# Patient Record
Sex: Female | Born: 1975 | Race: White | Hispanic: No | Marital: Married | Smoking: Former smoker
Health system: Southern US, Community
[De-identification: ages and names within clinical notes are randomized; demographics above are authoritative.]

## PROBLEM LIST (undated history)

## (undated) DIAGNOSIS — Z9889 Other specified postprocedural states: Secondary | ICD-10-CM

## (undated) DIAGNOSIS — E1169 Type 2 diabetes mellitus with other specified complication: Secondary | ICD-10-CM

## (undated) DIAGNOSIS — E669 Obesity, unspecified: Secondary | ICD-10-CM

## (undated) DIAGNOSIS — F319 Bipolar disorder, unspecified: Secondary | ICD-10-CM

## (undated) DIAGNOSIS — R112 Nausea with vomiting, unspecified: Secondary | ICD-10-CM

## (undated) DIAGNOSIS — T4145XA Adverse effect of unspecified anesthetic, initial encounter: Secondary | ICD-10-CM

## (undated) DIAGNOSIS — L309 Dermatitis, unspecified: Secondary | ICD-10-CM

## (undated) DIAGNOSIS — J302 Other seasonal allergic rhinitis: Secondary | ICD-10-CM

## (undated) DIAGNOSIS — T8859XA Other complications of anesthesia, initial encounter: Secondary | ICD-10-CM

## (undated) DIAGNOSIS — Z8042 Family history of malignant neoplasm of prostate: Secondary | ICD-10-CM

## (undated) HISTORY — DX: Dermatitis, unspecified: L30.9

## (undated) HISTORY — DX: Family history of malignant neoplasm of prostate: Z80.42

---
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
## 2005-08-13 ENCOUNTER — Other Ambulatory Visit: Admission: RE | Admit: 2005-08-13 | Discharge: 2005-08-13 | Payer: Self-pay | Admitting: Family Medicine

## 2007-12-16 HISTORY — PX: OOPHORECTOMY: SHX86

## 2007-12-16 HISTORY — PX: SALPINGECTOMY: SHX328

## 2008-03-23 ENCOUNTER — Other Ambulatory Visit: Admission: RE | Admit: 2008-03-23 | Discharge: 2008-03-23 | Payer: Self-pay | Admitting: Obstetrics and Gynecology

## 2008-04-11 ENCOUNTER — Emergency Department (HOSPITAL_COMMUNITY): Admission: EM | Admit: 2008-04-11 | Discharge: 2008-04-12 | Payer: Self-pay | Admitting: Emergency Medicine

## 2008-04-12 ENCOUNTER — Inpatient Hospital Stay (HOSPITAL_COMMUNITY): Admission: AD | Admit: 2008-04-12 | Discharge: 2008-04-15 | Payer: Self-pay | Admitting: *Deleted

## 2008-04-12 ENCOUNTER — Ambulatory Visit: Payer: Self-pay | Admitting: *Deleted

## 2008-04-26 ENCOUNTER — Ambulatory Visit: Payer: Self-pay | Admitting: Psychiatry

## 2008-05-04 ENCOUNTER — Ambulatory Visit: Payer: Self-pay | Admitting: Psychiatry

## 2008-05-11 ENCOUNTER — Ambulatory Visit: Payer: Self-pay | Admitting: Psychiatry

## 2008-05-15 ENCOUNTER — Ambulatory Visit: Payer: Self-pay | Admitting: Psychiatry

## 2008-06-05 ENCOUNTER — Ambulatory Visit: Payer: Self-pay | Admitting: Psychiatry

## 2008-06-27 ENCOUNTER — Ambulatory Visit: Payer: Self-pay | Admitting: Psychiatry

## 2008-07-11 ENCOUNTER — Ambulatory Visit: Payer: Self-pay | Admitting: Psychiatry

## 2008-07-26 ENCOUNTER — Ambulatory Visit: Payer: Self-pay | Admitting: Psychiatry

## 2008-08-08 ENCOUNTER — Ambulatory Visit: Payer: Self-pay | Admitting: Psychiatry

## 2008-09-27 ENCOUNTER — Ambulatory Visit: Payer: Self-pay | Admitting: Psychiatry

## 2008-11-01 ENCOUNTER — Ambulatory Visit: Payer: Self-pay | Admitting: Psychiatry

## 2009-09-20 ENCOUNTER — Other Ambulatory Visit: Admission: RE | Admit: 2009-09-20 | Discharge: 2009-09-20 | Payer: Self-pay | Admitting: Obstetrics and Gynecology

## 2010-10-16 ENCOUNTER — Other Ambulatory Visit: Admission: RE | Admit: 2010-10-16 | Discharge: 2010-10-16 | Payer: Self-pay | Admitting: Obstetrics and Gynecology

## 2011-04-29 NOTE — H&P (Signed)
[name], [name] NO.:  [id number]   MEDICAL RECORD NO.:  [id number]          PATIENT TYPE:  IPS   LOCATION:  0304                          FACILITY:  [facility]   PHYSICIAN:  Jasmine Pang, M.D. DATE OF BIRTH:  August 03, 1976   DATE OF ADMISSION:  04/12/2008  DATE OF DISCHARGE:                       PSYCHIATRIC ADMISSION ASSESSMENT   IDENTIFYING INFORMATION:  A 35 year old married white female.  This is a  voluntary admission.   HISTORY OF PRESENT ILLNESS:  First [facility] admission  for this pleasant 35 year old, whose family brought her to the emergency  room because of her mood fluctuation, some behavior out of character and  concerns about her mood.  Today, Rylei herself reports that she had  been having some mood fluctuations over the course of the past month.  About one month ago, she lapsed her lamotrigine, which she was taking  for bipolar disorder and then got back on it.  Then about two weeks ago,  also began having additional symptoms of mood fluctuation, sleep  decreased to 2-3 hours at a time maximum, broken sleep and feeling that  her thoughts were racing, that she could not keep track of things, then  reports that she got very overwhelmed and has been unable to think  straight.  She cites recent stressors of moving to a new house within  the past 30 days, having some marital discord at home along with some  work stressors and now they have an extra roommate in the house, a young  man that is staying with them.  She is also being evaluated for an  ovarian cyst and had laparoscopic surgery on April 11, 2008, and was  very worried about her physical condition until she learned the cyst was  benign.  She has currently not been taking her Lamictal for the past 4-5  days.  Other medication compliance is not clear.  She is denying any  hallucinations.  Denying any dangerous thoughts or intent to harm  herself or anyone else.  Admits to having  a lot of difficulty sleeping  and only slept two hours last night and having a lot of racing thoughts  and feeling disorganized.   PAST PSYCHIATRIC HISTORY:  First [facility] admission.  Francella is followed as an outpatient by Andee Poles, M.D., her  psychiatrist, and is currently taking Lamictal and Seroquel to manage  her mood.  Denies prior trials of Depakote or lithium.  Denies any prior  suicide attempt.  Denies other hospitalizations.   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  Home is stable.  Job is stable.  Her parents are in town and  they are supportive of her family history.  She denies any substance  abuse.   MEDICAL HISTORY:  Carola J. Gerri Spore, M.D., is her primary care  physician.  Current medical problems include status post laparoscopic  surgery for ovarian cyst.  CURRENT MEDICATIONS:  1. Phenergan 25 mg q.6-8h. p.r.n. for nausea.  2. Gabapentin 300 mg p.o. 2 capsules at bedtime.  3. Lamotrigine chewables 25 mg, was previously up to 150 mg daily, has      not taken in five days.  4. Alprazolam 1 mg, reports she takes between two and four of these      daily.  5. Hydrocodone/APAP 7.5 mg/750 mg one to two q.6h. p.r.n. for pain.  6. Seroquel XR 150 mg daily.  7. Oral allergy pills as needed for seasonal allergies.   DRUG ALLERGIES:  No known drug allergies.   PHYSICAL EXAMINATION:  VITAL SIGNS:  5 feet 4 inches tall, 271 pounds,  temperature 97.7, pulse 96, respirations 16, blood pressure 139/76.  GENERAL APPEARANCE:  This is a healthy-appearing white female in no  distress, whose full physical exam was done in the emergency room by Dr.  Rubin Payor.  Afebrile they.   DIAGNOSTIC STUDIES:  Urine pregnancy test negative.  Urine drug screen  positive for  benzodiazepines and opiates.  Routine urinalysis is normal  other than a trace of leukocyte esterase.  Urine wbc 0-2.  CBC:  A wbc  of 12.5, hemoglobin 12.7, hematocrit 37.9, platelets 430,000, MCV 77.  Alcohol level less than 5.  Chemistry sodium 137, potassium 4.3,  chloride 104, carbon dioxide 26, BUN 8, creatinine 0.7 and random  glucose 138.   MENTAL STATUS EXAM:  Fully alert female, pleasant, cooperative, bright  smile.  Affect is a bit elevated and in spite of her somewhat irritable  mood,  she is quite irritated with her husband, feeling that he forced  her to come here, that he has been rude to her, did not appreciate her  symptoms.  Speech is hyperverbal and rapid with tangential pattern.  Mood is elevated and irritable.  Thought process reflects flight of  ideas and tangentiality.  She is directable, needs to be prompted to  return to the object several times, accepts  prompting and direction  with only occasional a reflection of irritability.  Speech form is  normal, no pressure.  She is oriented to person, place and situation.  Insight is impaired.  Judgment is impaired.  She does have adequate  insight in order to list her various stressors including a recent move,  some job stress, feels that she has not been able to devote enough time  to get settled into the new house and is concerned about this newly  young gentleman that is living with them for reasons that are unclear.  Cognitively, she is completely intact and oriented x4.  Willing to work  with [place] on medication.  Recognizes that it is important to stay on her  medications and wants to resume the Lamictal.  Understands that we may  increase the Seroquel.   AXIS I:  Bipolar disorder, hypomanic.  AXIS II:  No diagnosis.  AXIS III:  Status post laparoscopic surgery.  AXIS IV:  Moderate, marital discord.  AXIS V:  Current 40 and past year not known.   PLAN:  Voluntarily admit the patient to stabilize her mood.  We  are  going to restart her Lamictal at 25 mg daily.  Seroquel 100 mg q.a.m.,  100 mg one-time daily p.r.n. for agitation and 300 mg p.o. nightly.   ESTIMATED LENGTH OF STAY:  Five days.      Margaret A. Lorin Picket, N.P.      Jasmine Pang, M.D.  Electronically Signed  MAS/MEDQ  D:  04/13/2008  T:  04/13/2008  Job:  [id number]

## 2011-05-02 NOTE — Discharge Summary (Signed)
[name], [name] NO.:  [id number]   MEDICAL RECORD NO.:  [id number]          PATIENT TYPE:  IPS   LOCATION:  0304                          FACILITY:  [facility]   PHYSICIAN:  Jasmine Pang, M.D. DATE OF BIRTH:  02-26-1976   DATE OF ADMISSION:  04/12/2008  DATE OF DISCHARGE:  04/15/2008                               DISCHARGE SUMMARY   IDENTIFICATION:  This is a 35 year old married white female who was  admitted on a voluntary basis.   HISTORY OF PRESENT ILLNESS:  This is the first [facility]  admission for this pleasant 35 year old, whose family brought her to the  emergency room because her mood fluctuation, some behavior out of  character and concerns about her mood.  Today, [name] herself reports  that she had been having some mood fluctuations over the course of the  past month.  About 1 month ago, she lapsed her lamotrigine, which she  was taking for bipolar disorder and then got back on it in about 2 weeks  ago.  She began having additional symptoms of mood fluctuation,  decreased sleep to 2-3 hours at a time maximum broken sleep, and feeling  that her thoughts were racing.  She began to feel she could not keep  track of things, then report she got very overwhelmed and had been  unable to think straight.  She cites recent stressors of moving to a new  house within the past 30 days.  She is having some marital discord at  home and some work stressors.  Now, they have an extra roommate in the  house, a young man that he is staying with them.  She is also being  evaluated for an ovarian cyst and had laparoscopic surgery on April 11, 2008.  She was very worried about her physical condition until she  learned the cyst was benign.  She has currently not been taking her  Lamictal for the past 4-5 days.  Other medication compliance is not  clear.  She is denying any hallucinations.  She is denying any dangerous  thoughts or intent to harm herself or  anyone else.  She admits to having  a lot of difficulty sleeping and only slept 2 hours last night having a  lot of racing thoughts and feeling disorganized.   PAST PSYCHIATRIC HISTORY:  This is the first [facility]  admission.  [name] is followed as an outpatient by Dr. Andee Poles,  her psychiatrist.  She is currently taking Lamictal and Seroquel to  manage her mood.  She denies prior trials of Depakote or lithium.  She  denies any prior suicide attempt.  She denies other hospitalizations.   MEDICAL HISTORY:  Current medical problems include, status post  laparoscopic surgery for ovarian cyst.   CURRENT MEDICATIONS:  1. Phenergan 25 mg q.6-8 h. for nausea.  2. Gabapentin 600 mg at bedtime.  3. Lamotrigine up to 150 mg daily, but has not taken in 5 days.  4. Alprazolam 1 mg between two and four of these daily.  5. Hydrocodone 7.5 mg/750 mg,  1-2 q.6 h. p.r.n. pain.  6. Seroquel XR 150 mg daily.  7. Oral allergy pills as needed for seasonal allergies.   DRUG ALLERGIES:  No known drug allergies.   PHYSICAL FINDINGS:  There were no acute physical or medical problems  noted.   DIAGNOSTIC STUDIES:  Urine pregnancy test was negative.  Urine drug  screen was positive for benzodiazepines and opiates.  She is prescribed  these.  Routine urinalysis is normal other than a trace of leukocyte  esterase.  The CBC was remarkable for hemoglobin of 12.7 and hematocrit  of 37.9.  Alcohol level was less than 5.  Chemistry panel was grossly  within normal limits and a random glucose was 138.   HOSPITAL COURSE:  Upon admission, the patient was continued on her  Seroquel XR 150 mg daily.  She was also started on Librium detox  protocol and Vicodin 7.5 mg/750 mg one q.6 h. p.r.n. pain, Seroquel 100  mg p.o. q.6 h. p.r.n. agitation, and lamotrigine 25 mg daily.  The  Seroquel on April 13, 2008, was changed to 100 mg b.i.d. p.r.n.  agitation and Seroquel 300 mg p.o. q.h.s.  The former  Seroquel was  discontinued.  In individual sessions with me, the patient was friendly  and cooperative.  She participated appropriately in groups and  activities in unit therapies.  She states she went into hypomania.  She was labile, hyperverbal, flight of ideas, and circumstantial.  There  was lot of stressors with her job and her mood.  Her sleep has been  poor.  She had not been taking her medicines appropriately.  As  hospitalization progressed, the patient's mental status improved.  Her  sleep was good.  Appetite was good.  The mood was less anxious, less  depressed, and less manic.  She had a family session with her husband,  who was very supportive and it was decided the [name] would live with  her parents for a period of time.  The parents are very supportive too.  On Apr 15, 2008, mental status had improved markedly from admission  status.  The patient was sleeping well.  Appetite was good.  Her mood  was euthymic.  Affect wide range.  There was no suicidal or homicidal  ideation.  No thoughts of self-injurious behavior.  No auditory or  visual hallucinations.  No paranoia or delusions.  Thoughts were logical  and goal-directed.  Thought content no predominant theme.  Cognitive was  grossly back to baseline.  It was felt the patient was safe for  discharge today.   DISCHARGE DIAGNOSES:  Axis I:  Bipolar disorder, hypomanic.  Axis II:  No diagnosis.  Axis III:  Status post laparoscopic surgery.  Axis IV:  Moderate to severe (marital discord, burden of psychiatric  illness, recent medical problems).  Axis V:  Global assessment of functioning was 50 upon discharge.  GAF  was 40 upon admission.  GAF highest past year was 60-65.   DISCHARGE PLANS:  There were no specific activity level or dietary  restrictions.   POSTHOSPITAL CARE PLANS:  The patient will see Dr. Nolen Mu on Apr 17, 2008, at 10 a.m.Marland Kitchen  She will be seen at [facility] for counseling.   DISCHARGE  MEDICATIONS:  1. Seroquel 300 mg at bedtime.  2. Lamictal 25 mg daily.  3. Hydrocodone as directed by her primary care doctor.  4. The Phenergan and allergy medications are to be resumed as      prescribed  by her primary care physician.      Jasmine Pang, M.D.  Electronically Signed     BHS/MEDQ  D:  05/18/2008  T:  05/18/2008  Job:  [id number]

## 2011-09-09 LAB — RAPID URINE DRUG SCREEN, HOSP PERFORMED
Cocaine: NOT DETECTED
Tetrahydrocannabinol: NOT DETECTED

## 2011-09-09 LAB — URINALYSIS, ROUTINE W REFLEX MICROSCOPIC
Ketones, ur: NEGATIVE
Nitrite: NEGATIVE
Specific Gravity, Urine: 1.007
pH: 6.5

## 2011-09-09 LAB — DIFFERENTIAL
Basophils Relative: 0
Eosinophils Absolute: 0
Eosinophils Relative: 0
Lymphs Abs: 0.9
Monocytes Relative: 2 — ABNORMAL LOW

## 2011-09-09 LAB — BASIC METABOLIC PANEL
BUN: 8
CO2: 26
Chloride: 104
Creatinine, Ser: 0.7
Potassium: 4.3

## 2011-09-09 LAB — URINE MICROSCOPIC-ADD ON

## 2011-09-09 LAB — CBC
HCT: 37.9
MCHC: 33.5
MCV: 77 — ABNORMAL LOW
Platelets: 430 — ABNORMAL HIGH
WBC: 12.5 — ABNORMAL HIGH

## 2011-09-09 LAB — PREGNANCY, URINE: Preg Test, Ur: NEGATIVE

## 2011-09-09 LAB — ETHANOL

## 2011-12-12 ENCOUNTER — Other Ambulatory Visit: Payer: Self-pay | Admitting: Obstetrics and Gynecology

## 2011-12-12 ENCOUNTER — Other Ambulatory Visit (HOSPITAL_COMMUNITY)
Admission: RE | Admit: 2011-12-12 | Discharge: 2011-12-12 | Disposition: A | Discharge status: Home / Self Care | Payer: 59 | Source: Ambulatory Visit | Attending: Obstetrics and Gynecology | Admitting: Obstetrics and Gynecology

## 2011-12-12 DIAGNOSIS — Z01419 Encounter for gynecological examination (general) (routine) without abnormal findings: Secondary | ICD-10-CM | POA: Insufficient documentation

## 2011-12-22 ENCOUNTER — Other Ambulatory Visit: Payer: Self-pay | Admitting: Obstetrics and Gynecology

## 2011-12-22 DIAGNOSIS — E049 Nontoxic goiter, unspecified: Secondary | ICD-10-CM

## 2011-12-26 ENCOUNTER — Other Ambulatory Visit: Payer: Self-pay

## 2012-01-14 ENCOUNTER — Ambulatory Visit
Admission: RE | Admit: 2012-01-14 | Discharge: 2012-01-14 | Disposition: A | Discharge status: Home / Self Care | Payer: 59 | Source: Ambulatory Visit | Attending: Obstetrics and Gynecology | Admitting: Obstetrics and Gynecology

## 2012-01-14 DIAGNOSIS — E049 Nontoxic goiter, unspecified: Secondary | ICD-10-CM

## 2012-02-12 ENCOUNTER — Other Ambulatory Visit: Payer: Self-pay | Admitting: Otolaryngology

## 2012-02-12 DIAGNOSIS — E042 Nontoxic multinodular goiter: Secondary | ICD-10-CM

## 2012-02-24 ENCOUNTER — Other Ambulatory Visit (HOSPITAL_COMMUNITY)
Admission: RE | Admit: 2012-02-24 | Discharge: 2012-02-24 | Disposition: A | Discharge status: Home / Self Care | Payer: 59 | Source: Ambulatory Visit | Attending: Interventional Radiology | Admitting: Interventional Radiology

## 2012-02-24 ENCOUNTER — Ambulatory Visit
Admission: RE | Admit: 2012-02-24 | Discharge: 2012-02-24 | Disposition: A | Discharge status: Home / Self Care | Payer: 59 | Source: Ambulatory Visit | Attending: Otolaryngology | Admitting: Otolaryngology

## 2012-02-24 DIAGNOSIS — E042 Nontoxic multinodular goiter: Secondary | ICD-10-CM

## 2012-02-24 DIAGNOSIS — E049 Nontoxic goiter, unspecified: Secondary | ICD-10-CM | POA: Insufficient documentation

## 2012-12-13 ENCOUNTER — Other Ambulatory Visit: Payer: Self-pay | Admitting: Obstetrics and Gynecology

## 2012-12-13 ENCOUNTER — Other Ambulatory Visit (HOSPITAL_COMMUNITY)
Admission: RE | Admit: 2012-12-13 | Discharge: 2012-12-13 | Disposition: A | Discharge status: Home / Self Care | Payer: 59 | Source: Ambulatory Visit | Attending: Obstetrics and Gynecology | Admitting: Obstetrics and Gynecology

## 2012-12-13 DIAGNOSIS — Z01419 Encounter for gynecological examination (general) (routine) without abnormal findings: Secondary | ICD-10-CM | POA: Insufficient documentation

## 2012-12-13 DIAGNOSIS — Z1151 Encounter for screening for human papillomavirus (HPV): Secondary | ICD-10-CM | POA: Insufficient documentation

## 2013-08-09 ENCOUNTER — Encounter (HOSPITAL_COMMUNITY): Payer: Self-pay

## 2013-08-09 ENCOUNTER — Emergency Department (HOSPITAL_COMMUNITY)
Admission: EM | Admit: 2013-08-09 | Discharge: 2013-08-09 | Disposition: A | Discharge status: Home / Self Care | Payer: Worker's Compensation | Attending: Emergency Medicine | Admitting: Emergency Medicine

## 2013-08-09 DIAGNOSIS — Y929 Unspecified place or not applicable: Secondary | ICD-10-CM | POA: Insufficient documentation

## 2013-08-09 DIAGNOSIS — Y990 Civilian activity done for income or pay: Secondary | ICD-10-CM | POA: Insufficient documentation

## 2013-08-09 DIAGNOSIS — Y9389 Activity, other specified: Secondary | ICD-10-CM | POA: Insufficient documentation

## 2013-08-09 DIAGNOSIS — IMO0002 Reserved for concepts with insufficient information to code with codable children: Secondary | ICD-10-CM | POA: Insufficient documentation

## 2013-08-09 DIAGNOSIS — E669 Obesity, unspecified: Secondary | ICD-10-CM | POA: Insufficient documentation

## 2013-08-09 DIAGNOSIS — Z79899 Other long term (current) drug therapy: Secondary | ICD-10-CM | POA: Insufficient documentation

## 2013-08-09 DIAGNOSIS — F319 Bipolar disorder, unspecified: Secondary | ICD-10-CM | POA: Insufficient documentation

## 2013-08-09 DIAGNOSIS — Z8709 Personal history of other diseases of the respiratory system: Secondary | ICD-10-CM | POA: Insufficient documentation

## 2013-08-09 DIAGNOSIS — S060X0A Concussion without loss of consciousness, initial encounter: Secondary | ICD-10-CM | POA: Insufficient documentation

## 2013-08-09 HISTORY — DX: Obesity, unspecified: E66.9

## 2013-08-09 HISTORY — DX: Other seasonal allergic rhinitis: J30.2

## 2013-08-09 HISTORY — DX: Bipolar disorder, unspecified: F31.9

## 2013-08-09 MED ORDER — IBUPROFEN 800 MG PO TABS
800.0000 mg | ORAL_TABLET | Freq: Once | ORAL | Status: AC
  Administered 2013-08-09: 800 mg via ORAL
  Filled 2013-08-09: qty 1

## 2013-08-09 MED ORDER — ONDANSETRON 4 MG PO TBDP
4.0000 mg | ORAL_TABLET | Freq: Once | ORAL | Status: AC
  Administered 2013-08-09: 4 mg via ORAL
  Filled 2013-08-09: qty 1

## 2013-08-09 NOTE — ED Notes (Signed)
Pt states at work, bent down and came up and hit her head on a cabinet; no LOC, c/o headache, nausea, sleepy; pt drinking water, no distress

## 2013-08-09 NOTE — ED Provider Notes (Signed)
CSN: [id number]     Arrival date & time 08/09/13  1724 History   First MD Initiated Contact with Patient 08/09/13 1838     Chief Complaint  Patient presents with  . Head Injury   (Consider location/radiation/quality/duration/timing/severity/associated sxs/prior Treatment) HPI This is a 37 year old female who presents from work following a closed head injury. The patient states that she was bending down when she came up and hit her for head on the cabinet. There was no loss of consciousness. This happened approximately 2 hours prior to arrival. She endorses headache, nausea, and sleepiness. She states that she applied ice directly to the wound immediately. She is concerned that she may have a concussion. She denies any vomiting, vision changes, focal weakness or numbness. Past Medical History  Diagnosis Date  . Obesity   . Pre-diabetes   . Bipolar 1 disorder   . Seasonal allergies    Past Surgical History  Procedure Laterality Date  . Oophorectomy     No family history on file. History  Substance Use Topics  . Smoking status: Never Smoker   . Smokeless tobacco: Not on file  . Alcohol Use: Yes     Comment: social   OB History   Grav Para Term Preterm Abortions TAB SAB Ect Mult Living                 Review of Systems  Constitutional: Negative for fever.  Eyes: Negative for visual disturbance.  Respiratory: Negative for cough, chest tightness and shortness of breath.   Cardiovascular: Negative for chest pain.  Gastrointestinal: Negative for nausea, vomiting and abdominal pain.  Genitourinary: Negative for dysuria.  Musculoskeletal: Negative for back pain.  Skin: Negative for wound.  Neurological: Positive for headaches. Negative for dizziness, syncope, weakness and numbness.  Psychiatric/Behavioral: Negative for confusion.  All other systems reviewed and are negative.    Allergies  Review of patient's allergies indicates no known allergies.  Home Medications    Current Outpatient Rx  Name  Route  Sig  Dispense  Refill  . ALPRAZolam (XANAX) 0.25 MG tablet   Oral   Take 0.25 mg by mouth 3 (three) times daily as needed for anxiety.         . benztropine (COGENTIN) 2 MG tablet   Oral   Take 2 mg by mouth at bedtime.         . cetirizine (ZYRTEC) 10 MG tablet   Oral   Take 10 mg by mouth every morning.         . lamoTRIgine (LAMICTAL) 200 MG tablet   Oral   Take 200 mg by mouth every morning.         . norethindrone-ethinyl estradiol (JUNEL FE,GILDESS FE,LOESTRIN FE) 1-20 MG-MCG tablet   Oral   Take 1 tablet by mouth daily.         . QUEtiapine (SEROQUEL) 200 MG tablet   Oral   Take 200 mg by mouth at bedtime.          BP 154/94  Pulse 92  Temp(Src) 98.6 F (37 C) (Oral)  Resp 20  SpO2 96%  LMP 08/02/2013 Physical Exam  Nursing note and vitals reviewed. Constitutional: She is oriented to person, place, and time. She appears well-developed and well-nourished.  HENT:  Head: Normocephalic.  Mild erythema noted to the right forehead along the hairline, no hematoma or laceration noted  Eyes: Pupils are equal, round, and reactive to light.  Neck: Normal range of motion.  Cardiovascular:  Normal rate, regular rhythm and normal heart sounds.   Pulmonary/Chest: Effort normal and breath sounds normal. No respiratory distress. She has no wheezes.  Abdominal: Soft. Bowel sounds are normal.  Neurological: She is alert and oriented to person, place, and time.  Cranial nerves II through XII intact, 5 out of 5 strength in all 4 extremities, gait normal, no dysmetria  Skin: Skin is warm and dry.  Psychiatric: She has a normal mood and affect.    ED Course  Procedures (including critical care time) Labs Review Labs Reviewed - No data to display Imaging Review No results found.   Medications  ibuprofen (ADVIL,MOTRIN) tablet 800 mg (800 mg Oral Given 08/09/13 1900)  ondansetron (ZOFRAN-ODT) disintegrating tablet 4 mg (4 mg  Oral Given 08/09/13 1900)   MDM   1. Mild concussion, without loss of consciousness, initial encounter    This is a 37 year old female who presents with a closed head injury. She is nontoxic-appearing on exam and her vital signs are notable for blood pressure 154/94. She endorses nausea and headache without emesis. Her neurologic exam is completely benign.  Patient is not on any anticoagulants. She has no obvious evidence of head trauma with the exception of mild erythema to the forehead. At this time the patient is very low risk for intracranial abnormality including head bleed. Given the patient's symptoms of headache and nausea, she may have a mild concussion. She will be given concussion precautions. She has a primary care appointment tomorrow and will followup regarding her symptoms. The patient stated understanding  After history, exam, and medical workup I feel the patient has been appropriately medically screened and is safe for discharge home. Pertinent diagnoses were discussed with the patient. Patient was given return precautions.    Shon Baton, MD 08/09/13 [phone]

## 2014-08-07 ENCOUNTER — Emergency Department (HOSPITAL_COMMUNITY)
Admission: EM | Admit: 2014-08-07 | Discharge: 2014-08-08 | Disposition: A | Discharge status: Other Acute Inpatient Hospital | Payer: 59 | Attending: Emergency Medicine | Admitting: Emergency Medicine

## 2014-08-07 DIAGNOSIS — R000 Tachycardia, unspecified: Secondary | ICD-10-CM | POA: Diagnosis not present

## 2014-08-07 DIAGNOSIS — E669 Obesity, unspecified: Secondary | ICD-10-CM | POA: Diagnosis not present

## 2014-08-07 DIAGNOSIS — F411 Generalized anxiety disorder: Secondary | ICD-10-CM | POA: Insufficient documentation

## 2014-08-07 DIAGNOSIS — F909 Attention-deficit hyperactivity disorder, unspecified type: Secondary | ICD-10-CM | POA: Insufficient documentation

## 2014-08-07 DIAGNOSIS — Z046 Encounter for general psychiatric examination, requested by authority: Secondary | ICD-10-CM | POA: Insufficient documentation

## 2014-08-07 DIAGNOSIS — Z3202 Encounter for pregnancy test, result negative: Secondary | ICD-10-CM | POA: Insufficient documentation

## 2014-08-07 DIAGNOSIS — F309 Manic episode, unspecified: Secondary | ICD-10-CM | POA: Diagnosis not present

## 2014-08-07 DIAGNOSIS — Z79899 Other long term (current) drug therapy: Secondary | ICD-10-CM | POA: Diagnosis not present

## 2014-08-07 DIAGNOSIS — F39 Unspecified mood [affective] disorder: Secondary | ICD-10-CM

## 2014-08-07 NOTE — ED Notes (Signed)
Bed: WTR6 Expected date:  Expected time:  Means of arrival:  Comments: In police custody

## 2014-08-08 ENCOUNTER — Inpatient Hospital Stay (HOSPITAL_COMMUNITY)
Admission: AD | Admit: 2014-08-08 | Discharge: 2014-08-14 | DRG: 885 | Disposition: A | Discharge status: Home / Self Care | Payer: 59 | Source: Intra-hospital | Attending: Emergency Medicine | Admitting: Emergency Medicine

## 2014-08-08 ENCOUNTER — Encounter (HOSPITAL_COMMUNITY): Payer: Self-pay | Admitting: Emergency Medicine

## 2014-08-08 ENCOUNTER — Encounter (HOSPITAL_COMMUNITY): Payer: Self-pay | Admitting: *Deleted

## 2014-08-08 DIAGNOSIS — G4700 Insomnia, unspecified: Secondary | ICD-10-CM | POA: Diagnosis present

## 2014-08-08 DIAGNOSIS — IMO0002 Reserved for concepts with insufficient information to code with codable children: Secondary | ICD-10-CM | POA: Diagnosis not present

## 2014-08-08 DIAGNOSIS — E119 Type 2 diabetes mellitus without complications: Secondary | ICD-10-CM | POA: Diagnosis present

## 2014-08-08 DIAGNOSIS — J302 Other seasonal allergic rhinitis: Secondary | ICD-10-CM | POA: Diagnosis present

## 2014-08-08 DIAGNOSIS — F319 Bipolar disorder, unspecified: Secondary | ICD-10-CM | POA: Insufficient documentation

## 2014-08-08 DIAGNOSIS — T169XXA Foreign body in ear, unspecified ear, initial encounter: Secondary | ICD-10-CM | POA: Diagnosis present

## 2014-08-08 DIAGNOSIS — E669 Obesity, unspecified: Secondary | ICD-10-CM | POA: Diagnosis present

## 2014-08-08 DIAGNOSIS — F3112 Bipolar disorder, current episode manic without psychotic features, moderate: Principal | ICD-10-CM | POA: Diagnosis present

## 2014-08-08 DIAGNOSIS — T162XXA Foreign body in left ear, initial encounter: Secondary | ICD-10-CM

## 2014-08-08 DIAGNOSIS — E1169 Type 2 diabetes mellitus with other specified complication: Secondary | ICD-10-CM | POA: Diagnosis present

## 2014-08-08 DIAGNOSIS — Z6841 Body Mass Index (BMI) 40.0 and over, adult: Secondary | ICD-10-CM

## 2014-08-08 DIAGNOSIS — H938X9 Other specified disorders of ear, unspecified ear: Secondary | ICD-10-CM | POA: Diagnosis present

## 2014-08-08 DIAGNOSIS — F39 Unspecified mood [affective] disorder: Secondary | ICD-10-CM | POA: Diagnosis present

## 2014-08-08 DIAGNOSIS — F312 Bipolar disorder, current episode manic severe with psychotic features: Secondary | ICD-10-CM

## 2014-08-08 DIAGNOSIS — S00452A Superficial foreign body of left ear, initial encounter: Secondary | ICD-10-CM

## 2014-08-08 DIAGNOSIS — F309 Manic episode, unspecified: Secondary | ICD-10-CM | POA: Diagnosis not present

## 2014-08-08 HISTORY — DX: Type 2 diabetes mellitus with other specified complication: E66.9

## 2014-08-08 HISTORY — DX: Type 2 diabetes mellitus with other specified complication: E11.69

## 2014-08-08 LAB — CBC
HCT: 38.7 % (ref 36.0–46.0)
HEMOGLOBIN: 12.9 g/dL (ref 12.0–15.0)
MCH: 26.5 pg (ref 26.0–34.0)
MCHC: 33.3 g/dL (ref 30.0–36.0)
MCV: 79.5 fL (ref 78.0–100.0)
Platelets: 386 10*3/uL (ref 150–400)
RBC: 4.87 MIL/uL (ref 3.87–5.11)
RDW: 12.9 % (ref 11.5–15.5)
WBC: 14.1 10*3/uL — AB (ref 4.0–10.5)

## 2014-08-08 LAB — RAPID URINE DRUG SCREEN, HOSP PERFORMED
AMPHETAMINES: NOT DETECTED
BARBITURATES: NOT DETECTED
BENZODIAZEPINES: POSITIVE — AB
Cocaine: NOT DETECTED
Opiates: NOT DETECTED
TETRAHYDROCANNABINOL: NOT DETECTED

## 2014-08-08 LAB — COMPREHENSIVE METABOLIC PANEL
ALK PHOS: 68 U/L (ref 39–117)
ALT: 14 U/L (ref 0–35)
AST: 23 U/L (ref 0–37)
Albumin: 4 g/dL (ref 3.5–5.2)
Anion gap: 18 — ABNORMAL HIGH (ref 5–15)
BILIRUBIN TOTAL: 0.4 mg/dL (ref 0.3–1.2)
BUN: 7 mg/dL (ref 6–23)
CALCIUM: 9.6 mg/dL (ref 8.4–10.5)
CHLORIDE: 96 meq/L (ref 96–112)
CO2: 20 meq/L (ref 19–32)
Creatinine, Ser: 0.79 mg/dL (ref 0.50–1.10)
GLUCOSE: 164 mg/dL — AB (ref 70–99)
POTASSIUM: 3.7 meq/L (ref 3.7–5.3)
SODIUM: 134 meq/L — AB (ref 137–147)
Total Protein: 8.2 g/dL (ref 6.0–8.3)

## 2014-08-08 LAB — ACETAMINOPHEN LEVEL: Acetaminophen (Tylenol), Serum: <15 ug/mL (ref 10–30)

## 2014-08-08 LAB — POC URINE PREG, ED: PREG TEST UR: NEGATIVE

## 2014-08-08 LAB — ETHANOL: Alcohol, Ethyl (B): <11 mg/dL (ref 0–11)

## 2014-08-08 LAB — SALICYLATE LEVEL: Salicylate Lvl: <2 mg/dL — ABNORMAL LOW (ref 2.8–20.0)

## 2014-08-08 MED ORDER — MAGNESIUM HYDROXIDE 400 MG/5ML PO SUSP
30.0000 mL | Freq: Every day | ORAL | Status: DC | PRN
  Administered 2014-08-11: 30 mL via ORAL

## 2014-08-08 MED ORDER — NORETHIN ACE-ETH ESTRAD-FE 1-20 MG-MCG PO TABS
1.0000 | ORAL_TABLET | Freq: Every day | ORAL | Status: DC
  Administered 2014-08-09 – 2014-08-14 (×5): 1 via ORAL

## 2014-08-08 MED ORDER — LORAZEPAM 1 MG PO TABS
1.0000 mg | ORAL_TABLET | Freq: Three times a day (TID) | ORAL | Status: DC | PRN
  Administered 2014-08-08: 1 mg via ORAL
  Filled 2014-08-08: qty 1

## 2014-08-08 MED ORDER — ZOLPIDEM TARTRATE 5 MG PO TABS: 5.0000 mg | ORAL_TABLET | Freq: Every evening | ORAL | Status: DC | PRN

## 2014-08-08 MED ORDER — ALPRAZOLAM 0.25 MG PO TABS: 0.2500 mg | ORAL_TABLET | Freq: Three times a day (TID) | ORAL | Status: DC | PRN

## 2014-08-08 MED ORDER — ONDANSETRON HCL 4 MG PO TABS: 4.0000 mg | ORAL_TABLET | Freq: Three times a day (TID) | ORAL | Status: DC | PRN

## 2014-08-08 MED ORDER — ZIPRASIDONE MESYLATE 20 MG IM SOLR
20.0000 mg | Freq: Once | INTRAMUSCULAR | Status: AC
  Administered 2014-08-08: 20 mg via INTRAMUSCULAR
  Filled 2014-08-08: qty 20

## 2014-08-08 MED ORDER — HALOPERIDOL 5 MG PO TABS
5.0000 mg | ORAL_TABLET | Freq: Four times a day (QID) | ORAL | Status: DC | PRN
  Administered 2014-08-09: 5 mg via ORAL
  Filled 2014-08-08: qty 1

## 2014-08-08 MED ORDER — LAMOTRIGINE 200 MG PO TABS
200.0000 mg | ORAL_TABLET | Freq: Every morning | ORAL | Status: DC
  Administered 2014-08-08: 200 mg via ORAL
  Filled 2014-08-08: qty 1

## 2014-08-08 MED ORDER — QUETIAPINE FUMARATE 200 MG PO TABS
200.0000 mg | ORAL_TABLET | Freq: Every day | ORAL | Status: DC
  Administered 2014-08-08: 200 mg via ORAL
  Filled 2014-08-08 (×3): qty 1

## 2014-08-08 MED ORDER — IBUPROFEN 200 MG PO TABS: 600.0000 mg | ORAL_TABLET | Freq: Three times a day (TID) | ORAL | Status: DC | PRN

## 2014-08-08 MED ORDER — LORATADINE 10 MG PO TABS
10.0000 mg | ORAL_TABLET | Freq: Every day | ORAL | Status: DC
  Administered 2014-08-08: 10 mg via ORAL
  Filled 2014-08-08: qty 1

## 2014-08-08 MED ORDER — TRAZODONE HCL 50 MG PO TABS
50.0000 mg | ORAL_TABLET | Freq: Every evening | ORAL | Status: DC | PRN
  Administered 2014-08-08 – 2014-08-13 (×9): 50 mg via ORAL
  Filled 2014-08-08 (×18): qty 1

## 2014-08-08 MED ORDER — LAMOTRIGINE 200 MG PO TABS
200.0000 mg | ORAL_TABLET | Freq: Every morning | ORAL | Status: DC
  Administered 2014-08-09: 200 mg via ORAL
  Filled 2014-08-08 (×2): qty 1

## 2014-08-08 MED ORDER — ACETAMINOPHEN 325 MG PO TABS
650.0000 mg | ORAL_TABLET | Freq: Four times a day (QID) | ORAL | Status: DC | PRN
  Administered 2014-08-09 – 2014-08-10 (×2): 650 mg via ORAL
  Filled 2014-08-08 (×2): qty 2

## 2014-08-08 MED ORDER — ALUM & MAG HYDROXIDE-SIMETH 200-200-20 MG/5ML PO SUSP
30.0000 mL | ORAL | Status: DC | PRN
  Administered 2014-08-11 – 2014-08-12 (×3): 30 mL via ORAL

## 2014-08-08 MED ORDER — NICOTINE 21 MG/24HR TD PT24: 21.0000 mg | MEDICATED_PATCH | Freq: Every day | TRANSDERMAL | Status: DC

## 2014-08-08 MED ORDER — DIPHENHYDRAMINE HCL 50 MG/ML IJ SOLN
50.0000 mg | Freq: Once | INTRAMUSCULAR | Status: AC
  Administered 2014-08-08: 50 mg via INTRAMUSCULAR
  Filled 2014-08-08: qty 1

## 2014-08-08 MED ORDER — ALPRAZOLAM 0.25 MG PO TABS
0.2500 mg | ORAL_TABLET | Freq: Three times a day (TID) | ORAL | Status: DC | PRN
  Administered 2014-08-09 (×2): 0.25 mg via ORAL
  Filled 2014-08-08 (×2): qty 1

## 2014-08-08 MED ORDER — LORAZEPAM 2 MG/ML IJ SOLN
2.0000 mg | Freq: Once | INTRAMUSCULAR | Status: AC
  Administered 2014-08-08: 2 mg via INTRAMUSCULAR
  Filled 2014-08-08: qty 1

## 2014-08-08 MED ORDER — QUETIAPINE FUMARATE 100 MG PO TABS: 200.0000 mg | ORAL_TABLET | Freq: Every day | ORAL | Status: DC

## 2014-08-08 MED ORDER — BENZTROPINE MESYLATE 2 MG PO TABS
2.0000 mg | ORAL_TABLET | Freq: Every day | ORAL | Status: DC
  Administered 2014-08-08: 2 mg via ORAL
  Filled 2014-08-08: qty 1
  Filled 2014-08-08: qty 2
  Filled 2014-08-08: qty 1

## 2014-08-08 MED ORDER — BENZTROPINE MESYLATE 1 MG PO TABS: 2.0000 mg | ORAL_TABLET | Freq: Every day | ORAL | Status: DC

## 2014-08-08 MED ORDER — MUPIROCIN CALCIUM 2 % EX CREA
TOPICAL_CREAM | Freq: Two times a day (BID) | CUTANEOUS | Status: DC
  Administered 2014-08-08 – 2014-08-11 (×7): via TOPICAL
  Administered 2014-08-11: 2 via TOPICAL
  Administered 2014-08-12 – 2014-08-14 (×5): via TOPICAL
  Filled 2014-08-08 (×3): qty 15

## 2014-08-08 MED ORDER — NORETHIN ACE-ETH ESTRAD-FE 1-20 MG-MCG PO TABS: 1.0000 | ORAL_TABLET | Freq: Every day | ORAL | Status: DC

## 2014-08-08 MED ORDER — LORATADINE 10 MG PO TABS
10.0000 mg | ORAL_TABLET | Freq: Every day | ORAL | Status: DC
  Administered 2014-08-09 – 2014-08-14 (×6): 10 mg via ORAL
  Filled 2014-08-08 (×8): qty 1

## 2014-08-08 MED ORDER — METFORMIN HCL 500 MG PO TABS
500.0000 mg | ORAL_TABLET | Freq: Two times a day (BID) | ORAL | Status: DC
  Administered 2014-08-09 – 2014-08-14 (×10): 500 mg via ORAL
  Filled 2014-08-08 (×15): qty 1

## 2014-08-08 NOTE — ED Notes (Signed)
IVC paperwork corrected and [employer] contacted for pick-up. Patient and family informed.

## 2014-08-08 NOTE — Tx Team (Signed)
Initial Interdisciplinary Treatment Plan   PATIENT STRESSORS: Health problems Marital or family conflict mania   PROBLEM LIST: Problem List/Patient Goals Date to be addressed Date deferred Reason deferred Estimated date of resolution  Altered mental status-confusion, mania, euphoria, tangential, disorganized                                                       DISCHARGE CRITERIA:  Ability to meet basic life and health needs Improved stabilization in mood, thinking, and/or behavior Motivation to continue treatment in a less acute level of care Need for constant or close observation no longer present Safe-care adequate arrangements made Verbal commitment to aftercare and medication compliance  PRELIMINARY DISCHARGE PLAN: Attend PHP/IOP Outpatient therapy Return to previous living arrangement  PATIENT/FAMIILY INVOLVEMENT: This treatment plan has been presented to and reviewed with the patient, [name], and/or family member.  The patient and family have been given the opportunity to ask questions and make suggestions.  Junius Finner [facility] 08/08/2014, 8:58 PM

## 2014-08-08 NOTE — ED Notes (Signed)
Patient came out of BR with pillow that had been taken apart.  Rationalized behavior and was redirected.  States "I did it because I didn't have a hair band." Report to provider and offer for prn medication for increased anxiety.

## 2014-08-08 NOTE — ED Notes (Signed)
Pt not cooperating with blood draw, pulled torniquet off arm.  Will try later.  [employer] in room

## 2014-08-08 NOTE — ED Notes (Signed)
Pt brought in by [employer] under IVC  Paperwork states pt was at [employer] acting erratically, walking around in her bra, throwing items, shouting "I am not a #basic bitch"  Hx bipolar, multiple meds  Husband indicates this is typical behavior for maic phase onset  Pt is danger to self  Pt is manic in behavior and words in triage  Handcuffs in place by [employer]

## 2014-08-08 NOTE — ED Notes (Signed)
Patient wrote on the wall with crayon that was provided by triage

## 2014-08-08 NOTE — Progress Notes (Signed)
  CARE MANAGEMENT ED NOTE 08/08/2014  Patient:  [name], [name]   Account Number:  [id number]  Date Initiated:  08/08/2014  Documentation initiated by:  Jackelyn Poling  Subjective/Objective Assessment:   38 yr old [facility] pt     Subjective/Objective Assessment Detail:   dx mood disorder pmh obesity bipolar 1 disorder, seasonal allergies  pcp Noelle Redmon at [place] [facility] [address] [place]  Psychologist Dr Tomasita Crumble  OB GYN Dr Thurnell Lose ob gyn     Action/Plan:   spoke with pt updated providers in epic   Action/Plan Detail:   Anticipated DC Date:  08/08/2014     Status Recommendation to Physician:   Result of Recommendation:    Other ED [place]  Other  PCP issues  Outpatient Services - Pt will follow up    Choice offered to / List presented to:            Status of service:  Completed, signed off  ED Comments:   ED Comments Detail:

## 2014-08-08 NOTE — Progress Notes (Signed)
Invol admit to the 400 hall after having a recent mental break.  Pt's thoughts are disorganized and tangential.  She was cooperative with the admission, but unable to sign paperwork or comprehend any patient teaching.  Pt took the folder that was given to her and a napkin and laid them on the table in the search room.  Then she poured the ice water that had been given to her by the TTS tech on the items and used her hand to smooth out the water to completely cover the items.  Then she shook the ice onto the table.  As if pleased with herself, she said, "there, that's perfect".  Her skin was checked.  Staff noted that the small tattoo to her R wrist was slightly red.  No other skin markings noted.  Pt was taken to the hall and oriented to her room.   After staff left the room, pt lay down in the floor and began screaming loudly, but would stop when staff entered the room.  Pt allowed her mother to come visit her, but she did not want her husband to come back to the unit.  Safety checks q15 minutes initiated.

## 2014-08-08 NOTE — ED Notes (Signed)
Report to TTS need for IVC first opinion.

## 2014-08-08 NOTE — ED Provider Notes (Signed)
CSN: [id number]     Arrival date & time 08/07/14  2356 History   First MD Initiated Contact with Patient 08/08/14 0204     Chief Complaint  Patient presents with  . Medical Clearance   HPI  History provided by the patient and IVC papers. Patient is a 38 year old female with history of bipolar disorder and previous manic episodes presenting with concerns of safety and harm to herself. Patient was found standing outside wearing only her bra yelling and acting erratically she was wandering in the parking lots. Husband reported that this was sometimes typical behavior of a manic phase. Patient states she was taking her medications regularly there is some question to whether this was true. He does report having some increased stress but does not give any specifics. She denies any SI or HI but does state that she needs a safe place to go. No other complaints.    Past Medical History  Diagnosis Date  . Obesity   . Pre-diabetes   . Bipolar 1 disorder   . Seasonal allergies    Past Surgical History  Procedure Laterality Date  . Oophorectomy     History reviewed. No pertinent family history. History  Substance Use Topics  . Smoking status: Never Smoker   . Smokeless tobacco: Not on file  . Alcohol Use: Yes     Comment: social   OB History   Grav Para Term Preterm Abortions TAB SAB Ect Mult Living                 Review of Systems  Unable to perform ROS: Psychiatric disorder      Allergies  Review of patient's allergies indicates no known allergies.  Home Medications   Prior to Admission medications   Medication Sig Start Date End Date Taking? Authorizing Provider  ALPRAZolam (XANAX) 0.25 MG tablet Take 0.25 mg by mouth 3 (three) times daily as needed for anxiety.   Yes Historical Provider, MD  benztropine (COGENTIN) 2 MG tablet Take 2 mg by mouth at bedtime.   Yes Historical Provider, MD  cetirizine (ZYRTEC) 10 MG tablet Take 10 mg by mouth every morning.   Yes Historical  Provider, MD  lamoTRIgine (LAMICTAL) 200 MG tablet Take 200 mg by mouth every morning.   Yes Historical Provider, MD  norethindrone-ethinyl estradiol (JUNEL FE,GILDESS FE,LOESTRIN FE) 1-20 MG-MCG tablet Take 1 tablet by mouth daily.   Yes Historical Provider, MD  QUEtiapine (SEROQUEL) 200 MG tablet Take 200 mg by mouth at bedtime.   Yes Historical Provider, MD   BP 137/77  Pulse 132  Temp(Src) 98.2 F (36.8 C) (Oral)  Resp 20  SpO2 97% Physical Exam  Nursing note and vitals reviewed. Constitutional: She is oriented to person, place, and time. She appears well-developed and well-nourished. No distress.  HENT:  Head: Normocephalic.  Eyes: Conjunctivae are normal.  Cardiovascular: Regular rhythm.  Tachycardia present.   Pulmonary/Chest: Effort normal and breath sounds normal. No respiratory distress.  Neurological: She is alert and oriented to person, place, and time.  Skin: Skin is warm and dry. No rash noted.  Psychiatric: Her mood appears anxious. Her speech is rapid and/or pressured. She is hyperactive. She expresses impulsivity and inappropriate judgment.    ED Course  Procedures   COORDINATION OF CARE:  Nursing notes reviewed. Vital signs reviewed. Initial pt interview and examination performed.   Filed Vitals:   08/07/14 2356 08/08/14 0043  BP: 140/83 137/77  Pulse: 141 132  Temp: 98.9 F (37.2  C) 98.2 F (36.8 C)  TempSrc: Oral Oral  Resp: 26 20  SpO2: 97% 97%    2:15 AM-patient seen and evaluated. She is acting very impulsively. She has had hyperactivity.  TTS consult placed. Psychiatric holding orders placed. Patient is medically cleared for further psychiatric evaluation.    Treatment plan initiated:Medications - No data to display  Results for orders placed during the hospital encounter of 08/07/14  ACETAMINOPHEN LEVEL      Result Value Ref Range   Acetaminophen (Tylenol), Serum <15.0  10 - 30 ug/mL  CBC      Result Value Ref Range   WBC 14.1 (*) 4.0  - 10.5 K/uL   RBC 4.87  3.87 - 5.11 MIL/uL   Hemoglobin 12.9  12.0 - 15.0 g/dL   HCT 38.7  36.0 - 46.0 %   MCV 79.5  78.0 - 100.0 fL   MCH 26.5  26.0 - 34.0 pg   MCHC 33.3  30.0 - 36.0 g/dL   RDW 12.9  11.5 - 15.5 %   Platelets 386  150 - 400 K/uL  COMPREHENSIVE METABOLIC PANEL      Result Value Ref Range   Sodium 134 (*) 137 - 147 mEq/L   Potassium 3.7  3.7 - 5.3 mEq/L   Chloride 96  96 - 112 mEq/L   CO2 20  19 - 32 mEq/L   Glucose, Bld 164 (*) 70 - 99 mg/dL   BUN 7  6 - 23 mg/dL   Creatinine, Ser 0.79  0.50 - 1.10 mg/dL   Calcium 9.6  8.4 - 10.5 mg/dL   Total Protein 8.2  6.0 - 8.3 g/dL   Albumin 4.0  3.5 - 5.2 g/dL   AST 23  0 - 37 U/L   ALT 14  0 - 35 U/L   Alkaline Phosphatase 68  39 - 117 U/L   Total Bilirubin 0.4  0.3 - 1.2 mg/dL   GFR calc non Af Amer >90  >90 mL/min   GFR calc Af Amer >90  >90 mL/min   Anion gap 18 (*) 5 - 15  ETHANOL      Result Value Ref Range   Alcohol, Ethyl (B) <11  0 - 11 mg/dL  SALICYLATE LEVEL      Result Value Ref Range   Salicylate Lvl <2.5 (*) 2.8 - 20.0 mg/dL  URINE RAPID DRUG SCREEN (HOSP PERFORMED)      Result Value Ref Range   Opiates NONE DETECTED  NONE DETECTED   Cocaine NONE DETECTED  NONE DETECTED   Benzodiazepines POSITIVE (*) NONE DETECTED   Amphetamines NONE DETECTED  NONE DETECTED   Tetrahydrocannabinol NONE DETECTED  NONE DETECTED   Barbiturates NONE DETECTED  NONE DETECTED  POC URINE PREG, ED      Result Value Ref Range   Preg Test, Ur NEGATIVE  NEGATIVE        MDM   Final diagnoses:  Mania        Martie Lee, PA-C 08/08/14 0600

## 2014-08-08 NOTE — ED Notes (Signed)
Patient is resting

## 2014-08-08 NOTE — Consult Note (Signed)
[facility] Face-to-Face Psychiatry Consult   Reason for Consult:  Manic behavior  Referring Physician:  EDP  [name] is an 38 y.o. female. Total Time spent with patient: 45 minutes  Assessment: AXIS I:  Mood Disorder NOS AXIS II:  Deferred AXIS III:   Past Medical History  Diagnosis Date  . Obesity   . Pre-diabetes   . Bipolar 1 disorder   . Seasonal allergies    AXIS IV:  other psychosocial or environmental problems AXIS V:  21-30 behavior considerably influenced by delusions or hallucinations OR serious impairment in judgment, communication OR inability to function in almost all areas  Plan:  Recommend psychiatric Inpatient admission when medically cleared.  Subjective:   [name] is a 38 y.o. female patient admitted with Mood Disorder, NOS.  HPI:  Patient states I thought I was on a journey at that moment; but now I know I wasn't.  I felt like all of the results and philosophies  , the value. You know like there was a calm, you are happy and then just cram down your throat."  Patient is jumping from subject to subject, disorganized, and irreverent. When asked about the pillow case that she took to the bathroom and took apart patient states "I had a green head band. I was just walking in my own glory; well I know what I was doing." Patient denies suicidal/homicidal ideation, psychosis, and paranoia.     HPI Elements:   Location:  odd behavior. Quality:  manic. Severity:  manic. Timing:  1 day. Review of Systems  HENT: Negative.   Musculoskeletal: Negative.   Psychiatric/Behavioral: Negative for depression, suicidal ideas and substance abuse. The patient is nervous/anxious.    History reviewed. No pertinent family history.  Past Psychiatric History: Past Medical History  Diagnosis Date  . Obesity   . Pre-diabetes   . Bipolar 1 disorder   . Seasonal allergies     reports that she has never smoked. She does not have any smokeless tobacco history on file. She reports  that she drinks alcohol. She reports that she does not use illicit drugs. History reviewed. No pertinent family history.         Allergies:  No Known Allergies  ACT Assessment Complete:  Yes:    Educational Status    Risk to Self: Risk to self with the past 6 months Is patient at risk for suicide?: No Substance abuse history and/or treatment for substance abuse?: No  Risk to Others:    Abuse:    Prior Inpatient Therapy:    Prior Outpatient Therapy:    Additional Information:                    Objective: Blood pressure 104/73, pulse 104, temperature 97.8 F (36.6 C), temperature source Oral, resp. rate 20, SpO2 100.00%.There is no height or weight on file to calculate BMI. Results for orders placed during the hospital encounter of 08/07/14 (from the past 72 hour(s))  URINE RAPID DRUG SCREEN (HOSP PERFORMED)     Status: Abnormal   Collection Time    08/08/14  2:13 AM      Result Value Ref Range   Opiates NONE DETECTED  NONE DETECTED   Cocaine NONE DETECTED  NONE DETECTED   Benzodiazepines POSITIVE (*) NONE DETECTED   Amphetamines NONE DETECTED  NONE DETECTED   Tetrahydrocannabinol NONE DETECTED  NONE DETECTED   Barbiturates NONE DETECTED  NONE DETECTED   Comment:  DRUG SCREEN FOR MEDICAL PURPOSES     ONLY.  IF CONFIRMATION IS NEEDED     FOR ANY PURPOSE, NOTIFY LAB     WITHIN 5 DAYS.                LOWEST DETECTABLE LIMITS     FOR URINE DRUG SCREEN     Drug Class       Cutoff (ng/mL)     Amphetamine      1000     Barbiturate      200     Benzodiazepine   500     Tricyclics       370     Opiates          300     Cocaine          300     THC              50  POC URINE PREG, ED     Status: None   Collection Time    08/08/14  2:42 AM      Result Value Ref Range   Preg Test, Ur NEGATIVE  NEGATIVE   Comment:            THE SENSITIVITY OF THIS     METHODOLOGY IS >24 mIU/mL  ACETAMINOPHEN LEVEL     Status: None   Collection Time    08/08/14   3:25 AM      Result Value Ref Range   Acetaminophen (Tylenol), Serum <15.0  10 - 30 ug/mL   Comment:            THERAPEUTIC CONCENTRATIONS VARY     SIGNIFICANTLY. A RANGE OF 10-30     ug/mL MAY BE AN EFFECTIVE     CONCENTRATION FOR MANY PATIENTS.     HOWEVER, SOME ARE BEST TREATED     AT CONCENTRATIONS OUTSIDE THIS     RANGE.     ACETAMINOPHEN CONCENTRATIONS     >150 ug/mL AT 4 HOURS AFTER     INGESTION AND >50 ug/mL AT 12     HOURS AFTER INGESTION ARE     OFTEN ASSOCIATED WITH TOXIC     REACTIONS.  CBC     Status: Abnormal   Collection Time    08/08/14  3:25 AM      Result Value Ref Range   WBC 14.1 (*) 4.0 - 10.5 K/uL   RBC 4.87  3.87 - 5.11 MIL/uL   Hemoglobin 12.9  12.0 - 15.0 g/dL   HCT 38.7  36.0 - 46.0 %   MCV 79.5  78.0 - 100.0 fL   MCH 26.5  26.0 - 34.0 pg   MCHC 33.3  30.0 - 36.0 g/dL   RDW 12.9  11.5 - 15.5 %   Platelets 386  150 - 400 K/uL  COMPREHENSIVE METABOLIC PANEL     Status: Abnormal   Collection Time    08/08/14  3:25 AM      Result Value Ref Range   Sodium 134 (*) 137 - 147 mEq/L   Potassium 3.7  3.7 - 5.3 mEq/L   Chloride 96  96 - 112 mEq/L   CO2 20  19 - 32 mEq/L   Glucose, Bld 164 (*) 70 - 99 mg/dL   BUN 7  6 - 23 mg/dL   Creatinine, Ser 0.79  0.50 - 1.10 mg/dL   Calcium 9.6  8.4 - 10.5 mg/dL   Total Protein 8.2  6.0 -  8.3 g/dL   Albumin 4.0  3.5 - 5.2 g/dL   AST 23  0 - 37 U/L   ALT 14  0 - 35 U/L   Alkaline Phosphatase 68  39 - 117 U/L   Total Bilirubin 0.4  0.3 - 1.2 mg/dL   GFR calc non Af Amer >90  >90 mL/min   GFR calc Af Amer >90  >90 mL/min   Comment: (NOTE)     The eGFR has been calculated using the CKD EPI equation.     This calculation has not been validated in all clinical situations.     eGFR's persistently <90 mL/min signify possible Chronic Kidney     Disease.   Anion gap 18 (*) 5 - 15  ETHANOL     Status: None   Collection Time    08/08/14  3:25 AM      Result Value Ref Range   Alcohol, Ethyl (B) <11  0 - 11 mg/dL    Comment:            LOWEST DETECTABLE LIMIT FOR     SERUM ALCOHOL IS 11 mg/dL     FOR MEDICAL PURPOSES ONLY  SALICYLATE LEVEL     Status: Abnormal   Collection Time    08/08/14  3:25 AM      Result Value Ref Range   Salicylate Lvl <8.4 (*) 2.8 - 20.0 mg/dL   Labs are reviewed see values above; medications reviewed and no changes made.  Current Facility-Administered Medications  Medication Dose Route Frequency Provider Last Rate Last Dose  . ALPRAZolam (XANAX) tablet 0.25 mg  0.25 mg Oral TID PRN Shuvon Rankin, NP      . benztropine (COGENTIN) tablet 2 mg  2 mg Oral QHS Peter S Dammen, PA-C      . ibuprofen (ADVIL,MOTRIN) tablet 600 mg  600 mg Oral Q8H PRN Martie Lee, PA-C      . lamoTRIgine (LAMICTAL) tablet 200 mg  200 mg Oral q morning - 10a Ruthell Rummage Dammen, PA-C   200 mg at 08/08/14 6962  . loratadine (CLARITIN) tablet 10 mg  10 mg Oral Daily Martie Lee, PA-C   10 mg at 08/08/14 0955  . norethindrone-ethinyl estradiol (JUNEL FE,GILDESS FE,LOESTRIN FE) 1-20 MG-MCG per tablet 1 tablet  1 tablet Oral Daily Ruthell Rummage Dammen, PA-C      . ondansetron (ZOFRAN) tablet 4 mg  4 mg Oral Q8H PRN Ruthell Rummage Dammen, PA-C      . QUEtiapine (SEROQUEL) tablet 200 mg  200 mg Oral QHS Martie Lee, PA-C       Current Outpatient Prescriptions  Medication Sig Dispense Refill  . ALPRAZolam (XANAX) 0.25 MG tablet Take 0.25 mg by mouth 3 (three) times daily as needed for anxiety.      . benztropine (COGENTIN) 2 MG tablet Take 2 mg by mouth at bedtime.      . cetirizine (ZYRTEC) 10 MG tablet Take 10 mg by mouth every morning.      . lamoTRIgine (LAMICTAL) 200 MG tablet Take 200 mg by mouth every morning.      . norethindrone-ethinyl estradiol (JUNEL FE,GILDESS FE,LOESTRIN FE) 1-20 MG-MCG tablet Take 1 tablet by mouth daily.      . QUEtiapine (SEROQUEL) 200 MG tablet Take 200 mg by mouth at bedtime.        Psychiatric Specialty Exam:     Blood pressure 104/73, pulse 104, temperature 97.8 F (36.6  C), temperature source Oral, resp.  rate 20, SpO2 100.00%.There is no height or weight on file to calculate BMI.  General Appearance: Casual  Eye Contact::  Good  Speech:  Clear and Coherent and Pressured  Volume:  Normal  Mood:  Anxious  Affect:  Congruent  Thought Process:  Disorganized, Irrelevant and Loose  Orientation:  Full (Time, Place, and Person)  Thought Content:  Rumination  Suicidal Thoughts:  No  Homicidal Thoughts:  No  Memory:  Immediate;   Good Recent;   Good Remote;   Fair  Judgement:  Impaired  Insight:  Fair  Psychomotor Activity:  Normal  Concentration:  Poor  Recall:  [place]: Fair  Akathisia:  Negative  Handed:  Right  AIMS (if indicated):     Assets:  Desire for Improvement Social Support  Sleep:      Musculoskeletal: Strength & Muscle Tone: within normal limits Gait & Station: normal Patient leans: N/A  Treatment Plan Summary: Daily contact with patient to assess and evaluate symptoms and progress in treatment Medication management Recommend inpatient treatment.  Monitor safety and stablization until inpatient bed is found.    Earleen Newport, FNP-BC 08/08/2014 1:29 PM

## 2014-08-08 NOTE — ED Notes (Signed)
Writer observed patient pulling her chair out in the hallway. Writer ask her if she can go back in her room, she  Started screaming saying no one asked her name or anything. She sat down in her chair in the hallway she clapped her hand and said " are we rolling or what" ?. Patient is standing in her room dancing, yelling, clapping her hands real loud.

## 2014-08-09 ENCOUNTER — Encounter (HOSPITAL_COMMUNITY): Payer: Self-pay | Admitting: Psychiatry

## 2014-08-09 LAB — TSH: TSH: 0.594 u[IU]/mL (ref 0.350–4.500)

## 2014-08-09 LAB — HEMOGLOBIN A1C
Hgb A1c MFr Bld: 6.8 % — ABNORMAL HIGH (ref <5.7)
Mean Plasma Glucose: 148 mg/dL — ABNORMAL HIGH (ref <117)

## 2014-08-09 MED ORDER — DIVALPROEX SODIUM ER 500 MG PO TB24
500.0000 mg | ORAL_TABLET | Freq: Two times a day (BID) | ORAL | Status: DC
  Administered 2014-08-09 – 2014-08-14 (×11): 500 mg via ORAL
  Filled 2014-08-09 (×16): qty 1

## 2014-08-09 MED ORDER — ALPRAZOLAM 0.25 MG PO TABS
0.2500 mg | ORAL_TABLET | Freq: Two times a day (BID) | ORAL | Status: DC | PRN
  Administered 2014-08-09 – 2014-08-11 (×2): 0.25 mg via ORAL
  Filled 2014-08-09 (×2): qty 1

## 2014-08-09 MED ORDER — HALOPERIDOL 5 MG PO TABS: 5.0000 mg | ORAL_TABLET | Freq: Three times a day (TID) | ORAL | Status: DC | PRN

## 2014-08-09 MED ORDER — DIPHENHYDRAMINE HCL 25 MG PO CAPS
25.0000 mg | ORAL_CAPSULE | Freq: Three times a day (TID) | ORAL | Status: DC | PRN
  Administered 2014-08-12: 25 mg via ORAL
  Filled 2014-08-09: qty 1

## 2014-08-09 MED ORDER — DIPHENHYDRAMINE HCL 50 MG/ML IJ SOLN: 25.0000 mg | Freq: Three times a day (TID) | INTRAMUSCULAR | Status: DC | PRN

## 2014-08-09 MED ORDER — LORAZEPAM 2 MG/ML IJ SOLN: 1.0000 mg | Freq: Three times a day (TID) | INTRAMUSCULAR | Status: DC | PRN

## 2014-08-09 MED ORDER — QUETIAPINE FUMARATE 100 MG PO TABS
100.0000 mg | ORAL_TABLET | Freq: Every day | ORAL | Status: DC
  Administered 2014-08-09: 100 mg via ORAL
  Filled 2014-08-09 (×2): qty 1

## 2014-08-09 MED ORDER — OLANZAPINE 5 MG PO TBDP
5.0000 mg | ORAL_TABLET | Freq: Two times a day (BID) | ORAL | Status: DC
  Administered 2014-08-09 – 2014-08-10 (×2): 5 mg via ORAL
  Filled 2014-08-09 (×6): qty 1

## 2014-08-09 MED ORDER — BENZTROPINE MESYLATE 1 MG PO TABS
1.0000 mg | ORAL_TABLET | Freq: Two times a day (BID) | ORAL | Status: DC
  Administered 2014-08-09 – 2014-08-14 (×10): 1 mg via ORAL
  Filled 2014-08-09 (×14): qty 1

## 2014-08-09 MED ORDER — LAMOTRIGINE 100 MG PO TABS
100.0000 mg | ORAL_TABLET | Freq: Every day | ORAL | Status: DC
  Filled 2014-08-09 (×2): qty 1

## 2014-08-09 MED ORDER — LORAZEPAM 1 MG PO TABS
1.0000 mg | ORAL_TABLET | Freq: Three times a day (TID) | ORAL | Status: DC | PRN
  Administered 2014-08-09 – 2014-08-13 (×4): 1 mg via ORAL
  Filled 2014-08-09 (×4): qty 1

## 2014-08-09 MED ORDER — LAMOTRIGINE 25 MG PO TABS
25.0000 mg | ORAL_TABLET | Freq: Every day | ORAL | Status: DC
  Administered 2014-08-10: 25 mg via ORAL
  Filled 2014-08-09 (×2): qty 1

## 2014-08-09 MED ORDER — HALOPERIDOL LACTATE 5 MG/ML IJ SOLN: 5.0000 mg | Freq: Three times a day (TID) | INTRAMUSCULAR | Status: DC | PRN

## 2014-08-09 NOTE — [facility] Suicide Risk Assessment (Signed)
   Nursing information obtained from:  Patient;Review of record Demographic factors:  Caucasian;Unemployed Current Mental Status:  NA (denies SI) Loss Factors:  NA Historical Factors:  Impulsivity Risk Reduction Factors:  Living with another person, especially a relative;Positive social support;Positive therapeutic relationship Total Time spent with patient: 20 minutes  CLINICAL FACTORS:   Bipolar Disorder:   Mixed State  Psychiatric Specialty Exam: Physical Exam  Constitutional: She appears well-developed and well-nourished.  HENT:  Head: Normocephalic.  Eyes: Conjunctivae are normal. Pupils are equal, round, and reactive to light.  Neck: Normal range of motion. Neck supple.  Cardiovascular: Normal rate and regular rhythm.   Respiratory: Effort normal.  GI: Soft.  Musculoskeletal: Normal range of motion.  Neurological: She is alert.  Psychiatric: Her mood appears anxious. Her affect is labile and inappropriate. Her speech is rapid and/or pressured. She is hyperactive. Thought content is delusional. She expresses inappropriate judgment. She is inattentive.    Review of Systems  Constitutional: Negative.   HENT: Negative.   Eyes: Negative.   Respiratory: Negative.   Cardiovascular: Negative.   Gastrointestinal: Negative.   Genitourinary: Negative.   Musculoskeletal: Negative.   Skin: Negative.   Neurological: Negative.   Psychiatric/Behavioral: Positive for hallucinations. The patient is nervous/anxious and has insomnia.     Blood pressure 139/107, pulse 112, temperature 98.4 F (36.9 C), temperature source Oral, resp. rate 20, height 5' 3.5" (1.613 m), weight 110.224 kg (243 lb).Body mass index is 42.37 kg/(m^2).  General Appearance: Disheveled  Eye Contact::  Good  Speech:  Pressured  Volume:  Increased  Mood:  Anxious, Euphoric and Irritable  Affect:  Labile  Thought Process:  Disorganized, Irrelevant and Tangential  Orientation:  Full (Time, Place, and Person)   Thought Content:  Delusions, Hallucinations: Auditory and Paranoid Ideation  Suicidal Thoughts:  No,however patient is very psychotic at this time and may not be able to verbalize her thoughts  Homicidal Thoughts:  No  Memory:  Negative  Judgement:  Impaired  Insight:  Lacking  Psychomotor Activity:  Increased  Concentration:  Fair  Recall:  [place]: Fair  Akathisia:  No    AIMS (if indicated):   0  Assets:  Desire for Improvement Housing  Sleep:  Number of Hours: 0   Musculoskeletal: Strength & Muscle Tone: within normal limits Gait & Station: normal Patient leans: N/A  COGNITIVE FEATURES THAT CONTRIBUTE TO RISK:  Polarized thinking    SUICIDE RISK:   Mild:  Suicidal ideation of limited frequency, intensity, duration, and specificity.  There are no identifiable plans, no associated intent, mild dysphoria and related symptoms, good self-control (both objective and subjective assessment), few other risk factors, and identifiable protective factors, including available and accessible social support.  PLAN OF CARE:  I certify that inpatient services furnished can reasonably be expected to improve the patient's condition.  Tamas Suen 08/09/2014, 11:04 AM

## 2014-08-09 NOTE — [facility] Group Notes (Signed)
[facility] LCSW Aftercare Discharge Planning Group Note   08/09/2014 10:46 AM  Participation Quality:  Engaged  Mood/Affect:  Euphoric  Depression Rating:  denies  Anxiety Rating:  denies  Thoughts of Suicide:  No Will you contract for safety?   NA  Current AVH:  No  Plan for Discharge/Comments:  "I am happy to be here."  Pt presents as euphoric, disorganized and tangential.  States it was her idea to come in.  Insists she was taking meds a prescribed by Dr Caprice Beaver. Is working in the [employer]."  When asked about children she said "not yet, but I made a significant contribution this AM and went on to talke about donating blood and blood types.   Transportation Means: husband  Supports: husband  [place], [place]
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
## 2014-08-09 NOTE — Tx Team (Signed)
Interdisciplinary Treatment Plan Update (Adult)   Date: 08/09/2014  Time Reviewed:9:13 AM  Progress in Treatment:  Attending groups: No. Pt arrived last night to unit.  Participating in groups: No   Taking medication as prescribed: Yes  Tolerating medication: Yes  Family/Significant othe contact made: Not yet. SPE/family contact needed for pt (collateral info)  Patient understands diagnosis: no, pt IVCed due to severity of symptoms and risk of harm to self.  Discussing patient identified problems/goals with staff: Yes  Medical problems stabilized or resolved: Yes  Denies suicidal/homicidal ideation: Yes during admission/self report.  Patient has not harmed self or Others: Yes  New problem(s) identified: Pt poor historian/unable to complete PSA at this time/continues to be disruptive, disorganized, and demonstrate manic symptoms on the unit. Pt is currently on 1:1 for safety.  Discharge Plan or Barriers: CSW assessing for appropriate referrals.  Additional comments: History provided by the patient and IVC papers. Patient is a 38 year old female with history of bipolar disorder and previous manic episodes presenting with concerns of safety and harm to herself. Patient was found standing outside wearing only her bra yelling and acting erratically she was wandering in the parking lots. Husband reported that this was sometimes typical behavior of a manic phase. Patient states she was taking her medications regularly there is some question to whether this was true. He does report having some increased stress but does not give any specifics. She denies any SI or HI but does state that she needs a safe place to go. Reason for Continuation of Hospitalization: Erratic behavior/mania/symptoms of psychosis Medication management Mood stabilization Estimated length of stay: 5-7 days  For review of initial/current patient goals, please see plan of care.  Attendees:  Patient:    Family:    Physician:Dr.  Eappen MD 08/09/2014 9:12 AM   Physician: Dr. Darleene Cleaver MD 08/09/2014 9:13 AM   Clinical Social Worker [place], [place]  08/09/2014 9:13 AM   Other: Roque Lias LCSW 08/09/2014 9:13 AM   Other: Marye Round RN 08/09/2014 9:13 AM   Other: Maudie Mercury RN 08/09/2014 9:13 AM   Other: Yong Channel CSW Intern  08/09/2014 9:13 AM   Scribe for Treatment Team:  Maxie Better LCSWA  08/09/2014 9:13 AM
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
## 2014-08-09 NOTE — Progress Notes (Signed)
Pt alert and confused. Pt often sings and rambles about imaginary friends. Pt spread eggs and bacon all around her room this morning and soaked all of the paper scrubs with water. Pt left those clothes and rolls of toilet tissue in bathroom until she was advised to clean up. Pt did follow instructions. Pt has been seen by MD and completely calmed down to speak with her. When Pt was back in room she continued the loud singing and personal conversations with herself. Pt pulled curtains and rod off of the window. Pt has been compliant with oral meds. Pt is now sleeping and remains on 1:1. RN & staff will continue to monitor.

## 2014-08-09 NOTE — Progress Notes (Signed)
D: Pt was sitting on the floor with her shirt half off.  Pt is pleasant and quiet at this time, but began pushing her bed with her feet. Writer informed pt that by pushing the bed she may be waking up some of her peers. Pt was very apologetic, even Psychologist, occupational that she Regulatory affairs officer) is lloved.   A: 1:1 continued for safety.  Pt given .25 mg ativan. Encouragement and support were offered.  R: Pt remains safe.

## 2014-08-09 NOTE — Progress Notes (Signed)
Patient in room. Continues on 1:1. No more complaints of pain in her ear at this time. Safety maintained. Libby Maw, RN

## 2014-08-09 NOTE — Progress Notes (Signed)
Patient crying in room, saying her ear hurts. She has pierced ears and her left ear is reddened with some dried pus over the earring. She was given Tylenol 650 mg per prn order for pain. She refused her Metformin saying that the dosage was wrong. She complained that a female staff was sitting with her on one to one this morning and that she did not feel safe because she was alone with a female staff member with no female staff to keep her safe. She is tearful, labile, and argumentative. Female staff currently sitting with patient one to one. She has removed the drapes in her room, will not say why, and is saying that she is worried that someone on the hallway will see her without any clothes on. She is currently wearing a gown with nothing on underneath, open in the back, and refused to put on another gown to cover her in back. Will continue to monitor. Libby Maw, RN

## 2014-08-09 NOTE — Progress Notes (Signed)
Adult Psychoeducational Group Note  Date:  08/09/2014 Time:  10:02 PM  Group Topic/Focus:  Wrap-Up Group:   The focus of this group is to help patients review their daily goal of treatment and discuss progress on daily workbooks.  Participation Level:  Did Not Attend  Participation Quality:  Did not attend  Affect:  did not attend  Cognitive:  did not attend  Insight: None  Engagement in Group:  did not attend  Modes of Intervention:  Socialization and Support  Additional Comments:  Patient did not attend  Debe Coder 08/09/2014, 10:02 PM

## 2014-08-09 NOTE — Consult Note (Signed)
Face to face evaluation and I agree with this note 

## 2014-08-09 NOTE — BHH Group Notes (Signed)
[facility] Group Therapy  08/09/2014 , 10:51 AM    Type of Therapy:  Mental Health Association Presentation  Participation Level: Did not attend  Participation Quality:  Attentive  Affect:  Blunted  Cognitive:  Oriented  Insight:  Limited  Engagement in Therapy:  Engaged  Modes of Intervention:  Discussion, Education and Socialization  Summary of Progress/Problems:  Shanon Brow from [place] came to present his recovery story and play the guitar.    Roque Lias B 08/09/2014 , 10:51 AM

## 2014-08-09 NOTE — [facility] Suicide Risk Assessment (Signed)
[place] INPATIENT:  Family/Significant Other Suicide Prevention Education  Suicide Prevention Education:  Education Completed; Damaris Hippo (pt's mother) [phone] has been identified by the patient as the family member/significant other with whom the patient will be residing, and identified as the person(s) who will aid the patient in the event of a mental health crisis (suicidal ideations/suicide attempt).  With written consent from the patient, the family member/significant other has been provided the following suicide prevention education, prior to the and/or following the discharge of the patient.  The suicide prevention education provided includes the following:  Suicide risk factors  Suicide prevention and interventions  National Suicide Hotline telephone number  [facility] assessment telephone number  [facility] Emergency Assistance [place] and/or Residential Mobile Crisis Unit telephone number  Request made of family/significant other to:  Remove weapons (e.g., guns, rifles, knives), all items previously/currently identified as safety concern.    Remove drugs/medications (over-the-counter, prescriptions, illicit drugs), all items previously/currently identified as a safety concern.  The family member/significant other verbalizes understanding of the suicide prevention education information provided.  The family member/significant other agrees to remove the items of safety concern listed above.  Pt's mother contacted for collateral information and to complete SPE. stated that to her knowledge, pt never endorsed SI or had suicide attempt in her lifetime. NO access to firearms.   Smart, Jahlia Omura LCSWA  08/09/2014, 10:37 AM

## 2014-08-10 MED ORDER — NITROFURANTOIN MONOHYD MACRO 100 MG PO CAPS
100.0000 mg | ORAL_CAPSULE | Freq: Two times a day (BID) | ORAL | Status: DC
  Administered 2014-08-10 – 2014-08-11 (×2): 100 mg via ORAL
  Filled 2014-08-10 (×5): qty 1

## 2014-08-10 MED ORDER — OLANZAPINE 10 MG PO TBDP
10.0000 mg | ORAL_TABLET | Freq: Every evening | ORAL | Status: DC
  Administered 2014-08-10: 10 mg via ORAL
  Filled 2014-08-10 (×2): qty 1

## 2014-08-10 MED ORDER — OLANZAPINE 5 MG PO TBDP
5.0000 mg | ORAL_TABLET | Freq: Every day | ORAL | Status: DC
  Administered 2014-08-11: 5 mg via ORAL
  Filled 2014-08-10 (×2): qty 1

## 2014-08-10 NOTE — Progress Notes (Addendum)
D Pt. Denies SI and HI, denies A and VH, continues to  be tangential and labile.  A Writer offered support and encouragement, discussed multiple skills with pt.  R Pt. Remains safe on the unit.   Pt. Is very concerned about her left ear that is red and edematous with drainage.  The earring is still in her ear.  The backing with the earring does not appear sterile.    Pt. Is also fixated on the fact that she received a letter from the magistrate informing her that she can go to court d/t being IVC'd.  Writer has attempted to explain this to pt. Multiple times but pt.  Now thinks she is going to have to go to court.

## 2014-08-10 NOTE — Progress Notes (Signed)
Patient ID: [name], female   DOB: [birth year], 38 y.o.   MRN: [id number] D: Client has visitors this shift, stands to the window to watch them leave, complains that visiting hours are not long enough. Client reports she is here because of a misunderstanding "I got a little confused" A: Writer introduced self to client, provided emotional support. Staff will monitor 1:1 for safety. R: Client is safe on the unit.

## 2014-08-10 NOTE — [facility] Group Notes (Signed)
[place] Group Notes:  (Counselor/Nursing/MHT/Case Management/Adjunct)  08/10/2014 1:15PM  Type of Therapy:  Group Therapy  Participation Level:  Did not attend  Summary of Progress/Problems: The topic for group was balance in life.  Pt participated in the discussion about when their life was in balance and out of balance and how this feels.  Pt discussed ways to get back in balance and short term goals they can work on to get where they want to be.    Trish Mage 08/10/2014 4:19 PM

## 2014-08-10 NOTE — Progress Notes (Addendum)
Patient ID: Lenord Carbo, female   DOB: June 20, 1976, 38 y.o.   MRN: [id number]  D: Pt was in her room and pt's mom was in attendance. Pt asked Probation officer about her day, and Probation officer redirected the question back to the pt. Pt informed the writer that she'd had a busy day. Writer informed pt that she was told pt pulled curtains of the wall, and had done other inappropriate things. Pt agreed, and was told that she couldn't continue with that type of behavior. Pt smiled and said, "i know".  Pt asked the writer to look at her ear piercing. Pt's left lobe was swollen and red.   A: Applied antibiotic to the pt's lobe. Continued to monitor 1:1 for safety. Support and encouragement was offered.   R: Pt remains safe.    Addendum  Pt's mother and female friend of the family both acknowledged that pt is confused. Female friend of family explained that pt believes he and she have a more than casual relationship. Female friend accompanied pt's mother, and pt's husband to the hosp to visit today. Pt also explained to writer that she and "friend" are going to get married.

## 2014-08-10 NOTE — Progress Notes (Signed)
[facility] MD Progress Note  08/10/2014 12:02 PM [name]  MRN:  [id number] Subjective: " I am at peace"  Objective: Patient seen and chart reviewed. Patient reports she feels that she is at peace today. She continues to be delusional and disorganized . She has pressured speech,flight of idea, continues to be manic.Denies any SI/HI/AH/VH today.  Per staff report patient continues to be found singing ,rambling at times. Patient also found to be disorganized ,hyperactive,manic with pressured speech.    Diagnosis:   DSM5:  Primary psychiatric diagnosis:  Bipolar disorder,Type I , Currently in Manic phase,severe with psychotic features.   Non psychiatric diagnosis:  Prediabetes  Morbid obesity    Total Time spent with patient: 20 minutes    ADL's:  Impaired  Sleep: Fair  Appetite:  Fair  Suicidal Ideation:  denies Homicidal Ideation:  Denies  AEB (as evidenced by):  Psychiatric Specialty Exam: Physical Exam  Constitutional: She appears well-developed and well-nourished.  HENT:  Head: Normocephalic.  Skin: Skin is warm.  Psychiatric: Her mood appears anxious. Her affect is labile. Her speech is rapid and/or pressured. She is hyperactive. Thought content is delusional. Cognition and memory are normal. She expresses impulsivity.    Review of Systems  Gastrointestinal: Positive for abdominal pain (reports some cramps,gas).  Psychiatric/Behavioral: Positive for hallucinations (is delusional). Negative for suicidal ideas. The patient is nervous/anxious.     Blood pressure 123/74, pulse 119, temperature 98.3 F (36.8 C), temperature source Oral, resp. rate 18, height 5' 3.5" (1.613 m), weight 110.224 kg (243 lb).Body mass index is 42.37 kg/(m^2).  General Appearance: Disheveled  Eye Sport and exercise psychologist::  Fair  Speech:  Pressured  Volume:  Normal  Mood:  Anxious  Affect:  Labile  Thought Process:  Circumstantial, Irrelevant, Loose and Tangential  Orientation:  Full (Time, Place,  and Person)  Thought Content:  Delusions  Suicidal Thoughts:  No  Homicidal Thoughts:  No  Memory:  Negative  Judgement:  Impaired  Insight:  Lacking  Psychomotor Activity:  Increased  Concentration:  Poor  Recall:  Poor  Fund of Knowledge:Fair  Language: Fair  Akathisia:  No    AIMS (if indicated):   0  Assets:  Desire for Improvement Social Support  Sleep:  Number of Hours: 6.75   Musculoskeletal: Strength & Muscle Tone: within normal limits Gait & Station: normal Patient leans: N/A  Current Medications: Current Facility-Administered Medications  Medication Dose Route Frequency Provider Last Rate Last Dose  . acetaminophen (TYLENOL) tablet 650 mg  650 mg Oral Q6H PRN Laverle Hobby, PA-C   650 mg at 08/09/14 1734  . ALPRAZolam Duanne Moron) tablet 0.25 mg  0.25 mg Oral BID PRN Ursula Alert, MD   0.25 mg at 08/09/14 2247  . alum & mag hydroxide-simeth (MAALOX/MYLANTA) 200-200-20 MG/5ML suspension 30 mL  30 mL Oral Q4H PRN Laverle Hobby, PA-C      . benztropine (COGENTIN) tablet 1 mg  1 mg Oral BID Ursula Alert, MD   1 mg at 08/10/14 0901  . diphenhydrAMINE (BENADRYL) capsule 25 mg  25 mg Oral Q8H PRN Ursula Alert, MD       Or  . diphenhydrAMINE (BENADRYL) injection 25 mg  25 mg Intramuscular Q8H PRN Melyssa Signor, MD      . divalproex (DEPAKOTE ER) 24 hr tablet 500 mg  500 mg Oral BID Ursula Alert, MD   500 mg at 08/10/14 0901  . haloperidol (HALDOL) tablet 5 mg  5 mg Oral Q8H PRN Ursula Alert, MD  Or  . haloperidol lactate (HALDOL) injection 5 mg  5 mg Intramuscular Q8H PRN Ursula Alert, MD      . loratadine (CLARITIN) tablet 10 mg  10 mg Oral Daily Laverle Hobby, PA-C   10 mg at 08/10/14 2841  . LORazepam (ATIVAN) tablet 1 mg  1 mg Oral Q8H PRN Ursula Alert, MD   1 mg at 08/09/14 1107   Or  . LORazepam (ATIVAN) injection 1 mg  1 mg Intramuscular Q8H PRN Takaya Hyslop, MD      . magnesium hydroxide (MILK OF MAGNESIA) suspension 30 mL  30 mL Oral Daily  PRN Laverle Hobby, PA-C      . metFORMIN (GLUCOPHAGE) tablet 500 mg  500 mg Oral BID WC Laverle Hobby, PA-C   500 mg at 08/10/14 0901  . mupirocin cream (BACTROBAN) 2 %   Topical BID Laverle Hobby, PA-C      . norethindrone-ethinyl estradiol (JUNEL FE,GILDESS FE,LOESTRIN FE) 1-20 MG-MCG per tablet 1 tablet  1 tablet Oral Daily Laverle Hobby, PA-C   1 tablet at 08/10/14 0800  . OLANZapine zydis (ZYPREXA) disintegrating tablet 10 mg  10 mg Oral QPM Ursula Alert, MD      . Derrill Memo ON 08/11/2014] OLANZapine zydis (ZYPREXA) disintegrating tablet 5 mg  5 mg Oral Daily Idus Rathke, MD      . traZODone (DESYREL) tablet 50 mg  50 mg Oral QHS,MR X 1 Laverle Hobby, PA-C   50 mg at 08/09/14 2247    Lab Results:  Results for orders placed during the hospital encounter of 08/08/14 (from the past 48 hour(s))  HEMOGLOBIN A1C     Status: Abnormal   Collection Time    08/09/14  6:43 AM      Result Value Ref Range   Hemoglobin A1C 6.8 (*) <5.7 %   Comment: (NOTE)                                                                               According to the ADA Clinical Practice Recommendations for 2011, when     HbA1c is used as a screening test:      >=6.5%   Diagnostic of Diabetes Mellitus               (if abnormal result is confirmed)     5.7-6.4%   Increased risk of developing Diabetes Mellitus     References:Diagnosis and Classification of Diabetes Mellitus,Diabetes     LKGM,0102,72(ZDGUY 1):S62-S69 and Standards of Medical Care in             Diabetes - 2011,Diabetes Care,2011,34 (Suppl 1):S11-S61.   Mean Plasma Glucose 148 (*) <117 mg/dL   Comment: Performed at [facility]  TSH     Status: None   Collection Time    08/09/14  6:43 AM      Result Value Ref Range   TSH 0.594  0.350 - 4.500 uIU/mL   Comment: Performed at [facility]    Physical Findings: AIMS: Facial and Oral Movements Muscles of Facial Expression: None, normal Lips and Perioral Area: None,  normal Jaw: None, normal Tongue: None, normal,Extremity Movements Upper (arms, wrists, hands,  fingers): None, normal Lower (legs, knees, ankles, toes): None, normal, Trunk Movements Neck, shoulders, hips: None, normal, Overall Severity Severity of abnormal movements (highest score from questions above): None, normal Incapacitation due to abnormal movements: None, normal Patient's awareness of abnormal movements (rate only patient's report): No Awareness, Dental Status Current problems with teeth and/or dentures?: No Does patient usually wear dentures?: No  CIWA:    COWS:     Treatment Plan Summary: Daily contact with patient to assess and evaluate symptoms and progress in treatment Medication management  Plan:  Will continue her on Depakote 500 mg po bid for mood swings. Will get depakote level on Sunday AM. Will discontinue Lamictal po.  Will increase Zyprexa Zydis 15 mg po daily Will discontinue seroquel .  Will make available prn's for agitation.  Will reduce xanax prn to bid and will continue to taper her off it once stable.  Reviewed previous charts in EMR.  Patient's labs reviewed,UA -Reviewed - will start Macrobid BID.        Medical Decision Making Problem Points:  Established problem, stable/improving (1), Review of last therapy session (1) and Review of psycho-social stressors (1) Data Points:  Order Aims Assessment (2) Review of medication regiment & side effects (2) Review of new medications or change in dosage (2)  I certify that inpatient services furnished can reasonably be expected to improve the patient's condition.   Xochilt Conant 08/10/2014, 12:02 PM

## 2014-08-10 NOTE — Progress Notes (Signed)
1:1  Pt. Was in her room when writer assessed with sitter by her side.  Pt. Was noted organizing her clothes on the bed and explained the meaning of each t shirt, still tangential at times.  Pt. Denies pain or discomfort.  Pt. Shows the sitter and Probation officer a book that her Brother brought her as she admired the cover of the book but has no knowledge of its contents.  Pt. Also goes into detail about the clothing she is wearing asking if it is appropriate.  Pt. Remains safe on the unit.  Continue 1:1 for pt. Safety.

## 2014-08-10 NOTE — [facility] Group Notes (Signed)
[place] Group Notes:  Goals group and kicking bad habits  Date:  08/10/2014  Time:  10:54 AM  Type of Therapy:  Nurse Education  Participation Level:  Active  Participation Quality:  Appropriate  Affect:  Appropriate  Cognitive:  Alert  Insight:  Appropriate  Engagement in Group:  Engaged  Modes of Intervention:  Activity  Summary of Progress/Problems:  [name] 08/10/2014, 10:54 AM

## 2014-08-10 NOTE — Progress Notes (Signed)
Pt laying in bed resting with eyes closed.Continue 1:1 for pt safety.  Respirations even and unlabored. No distress noted.

## 2014-08-10 NOTE — ED Provider Notes (Signed)
Medical screening examination/treatment/procedure(s) were performed by non-physician practitioner and as supervising physician I was immediately available for consultation/collaboration.   EKG Interpretation None        Mariea Clonts, MD 08/10/14 [phone]

## 2014-08-11 ENCOUNTER — Encounter (HOSPITAL_COMMUNITY): Payer: Self-pay | Admitting: Emergency Medicine

## 2014-08-11 ENCOUNTER — Encounter (HOSPITAL_COMMUNITY): Payer: Self-pay | Admitting: Internal Medicine

## 2014-08-11 ENCOUNTER — Emergency Department (HOSPITAL_COMMUNITY): Admit: 2014-08-11 | Discharge: 2014-08-22 | Discharge status: Against Medical Advice | Payer: 59

## 2014-08-11 DIAGNOSIS — J302 Other seasonal allergic rhinitis: Secondary | ICD-10-CM | POA: Diagnosis present

## 2014-08-11 DIAGNOSIS — S1095XA Superficial foreign body of unspecified part of neck, initial encounter: Secondary | ICD-10-CM

## 2014-08-11 DIAGNOSIS — E119 Type 2 diabetes mellitus without complications: Secondary | ICD-10-CM | POA: Diagnosis present

## 2014-08-11 DIAGNOSIS — S0005XA Superficial foreign body of scalp, initial encounter: Secondary | ICD-10-CM

## 2014-08-11 DIAGNOSIS — E1169 Type 2 diabetes mellitus with other specified complication: Secondary | ICD-10-CM | POA: Diagnosis present

## 2014-08-11 DIAGNOSIS — S0085XA Superficial foreign body of other part of head, initial encounter: Secondary | ICD-10-CM

## 2014-08-11 DIAGNOSIS — S00452A Superficial foreign body of left ear, initial encounter: Secondary | ICD-10-CM

## 2014-08-11 DIAGNOSIS — E669 Obesity, unspecified: Secondary | ICD-10-CM

## 2014-08-11 LAB — URINALYSIS W MICROSCOPIC (NOT AT ARMC)
BILIRUBIN URINE: NEGATIVE
GLUCOSE, UA: NEGATIVE mg/dL
Hgb urine dipstick: NEGATIVE
KETONES UR: NEGATIVE mg/dL
LEUKOCYTES UA: NEGATIVE
Nitrite: NEGATIVE
PH: 7 (ref 5.0–8.0)
Protein, ur: NEGATIVE mg/dL
Specific Gravity, Urine: 1.004 — ABNORMAL LOW (ref 1.005–1.030)
Urobilinogen, UA: 0.2 mg/dL (ref 0.0–1.0)

## 2014-08-11 LAB — CBG MONITORING, ED: Glucose-Capillary: 183 mg/dL — ABNORMAL HIGH (ref 70–99)

## 2014-08-11 MED ORDER — OLANZAPINE 5 MG PO TABS
5.0000 mg | ORAL_TABLET | Freq: Once | ORAL | Status: AC
  Administered 2014-08-11: 5 mg via ORAL
  Filled 2014-08-11 (×2): qty 1

## 2014-08-11 MED ORDER — OLANZAPINE 10 MG PO TBDP
10.0000 mg | ORAL_TABLET | Freq: Two times a day (BID) | ORAL | Status: DC
  Administered 2014-08-11 – 2014-08-14 (×6): 10 mg via ORAL
  Filled 2014-08-11 (×10): qty 1

## 2014-08-11 MED ORDER — SULFAMETHOXAZOLE-TMP DS 800-160 MG PO TABS
2.0000 | ORAL_TABLET | Freq: Two times a day (BID) | ORAL | Status: DC
  Administered 2014-08-11 – 2014-08-14 (×6): 2 via ORAL
  Filled 2014-08-11 (×10): qty 2

## 2014-08-11 MED ORDER — CEPHALEXIN 500 MG PO CAPS
500.0000 mg | ORAL_CAPSULE | Freq: Two times a day (BID) | ORAL | Status: DC
  Administered 2014-08-11 – 2014-08-14 (×6): 500 mg via ORAL
  Filled 2014-08-11 (×10): qty 1

## 2014-08-11 MED ORDER — LIDOCAINE HCL 2 % IJ SOLN
5.0000 mL | Freq: Once | INTRAMUSCULAR | Status: AC
  Administered 2014-08-11: 100 mg via INTRADERMAL
  Filled 2014-08-11: qty 20

## 2014-08-11 NOTE — ED Provider Notes (Signed)
CSN: [id number]     Arrival date & time 08/11/14  1847 History  This chart was scribed for non-physician practitioner, Jeannett Senior, PA-C working with Archer, DO by Einar Pheasant, ED scribe. This patient was seen in room WTR6/WTR6 and the patient's care was started at 7:05 PM.    Chief Complaint  Patient presents with  . Foreign Body in [place]   The history is provided by the patient. No language interpreter was used.   HPI Comments: [name] is a 38 y.o. female who presents to the Emergency Department complaining of a foreign body in her left earlobe. 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 Now the earrings has gone through her skin and is stuck in her right ear lobe. She is also complaining of associated redness, pain, and swelling to the effected ear. Denies any fever, chest pain, chills, or SOB.   Past Medical History  Diagnosis Date  . Obesity   . Diabetes mellitus type 2 in obese   . Bipolar 1 disorder   . Seasonal allergies    Past Surgical History  Procedure Laterality Date  . Oophorectomy  2009   Family History  Problem Relation Age of Onset  . Heart disease Other   . Macular degeneration Other   . Mental illness      both sides of family   History  Substance Use Topics  . Smoking status: Never Smoker   . Smokeless tobacco: Not on file  . Alcohol Use: Yes     Comment: social   OB History   Grav Para Term Preterm Abortions TAB SAB Ect Mult Living                 Review of Systems  Constitutional: Negative for fever and chills.  HENT: Positive for ear pain. Negative for congestion.   Respiratory: Negative for cough and shortness of breath.   Gastrointestinal: Negative for nausea, vomiting and abdominal pain.  Musculoskeletal: Positive for myalgias.      Allergies  Review of patient's allergies indicates no known allergies.  Home Medications   Prior to  Admission medications   Medication Sig Start Date End Date Taking? Authorizing Provider  ALPRAZolam (XANAX) 0.25 MG tablet Take 0.25 mg by mouth 3 (three) times daily as needed for anxiety.    Historical Provider, MD  benztropine (COGENTIN) 2 MG tablet Take 2 mg by mouth at bedtime.    Historical Provider, MD  cetirizine (ZYRTEC) 10 MG tablet Take 10 mg by mouth every morning.    Historical Provider, MD  lamoTRIgine (LAMICTAL) 200 MG tablet Take 200 mg by mouth every morning.    Historical Provider, MD  norethindrone-ethinyl estradiol (JUNEL FE,GILDESS FE,LOESTRIN FE) 1-20 MG-MCG tablet Take 1 tablet by mouth daily.    Historical Provider, MD  QUEtiapine (SEROQUEL) 200 MG tablet Take 200 mg by mouth at bedtime.    Historical Provider, MD   Triage Vitals: BP 106/83  Pulse 103  Temp(Src) 98.2 F (36.8 C) (Oral)  Resp 18  Ht 5' 3.5" (1.613 m)  Wt 243 lb (110.224 kg)  BMI 42.37 kg/m2   Physical Exam  Nursing note and vitals reviewed. Constitutional: She is oriented to person, place, and time. She appears well-developed and well-nourished. No distress.  HENT:  Head: Normocephalic and atraumatic.  The stud of the earring inside left earlobe. Surrounding swelling. No drainage. Tender to palpation.  Eyes: Conjunctivae and EOM are normal.  Neck: Neck supple.  Cardiovascular: Normal rate.   Pulmonary/Chest: Effort normal. No respiratory distress.  Musculoskeletal: Normal range of motion.  Neurological: She is alert and oriented to person, place, and time.  Skin: Skin is warm and dry.  Psychiatric: She has a normal mood and affect. Her behavior is normal.    ED Course  FOREIGN BODY REMOVAL Date/Time: 08/11/2014 7:46 PM Performed by: Jeannett Senior A Authorized by: Jeannett Senior A Consent: Verbal consent obtained. Risks and benefits: risks, benefits and alternatives were discussed Consent given by: patient Patient understanding: patient states understanding of the procedure  being performed Patient identity confirmed: verbally with patient and arm band Body area: skin General location: head/neck Location details: left ear Anesthesia: local infiltration Local anesthetic: lidocaine 2% without epinephrine Anesthetic total: 1 ml Patient sedated: no Patient restrained: no Patient cooperative: yes Complexity: simple 1 objects recovered. Objects recovered: earring Post-procedure assessment: foreign body removed Patient tolerance: Patient tolerated the procedure well with no immediate complications.   (including critical care time)  DIAGNOSTIC STUDIES: Oxygen Saturation was not taken.    COORDINATION OF CARE: 7:10 PM- Foreign body will be removed. Pt advised of plan for treatment and pt agrees.  Labs Review Labs Reviewed  HEMOGLOBIN A1C - Abnormal; Notable for the following:    Hemoglobin A1C 6.8 (*)    Mean Plasma Glucose 148 (*)    All other components within normal limits  TSH  URINALYSIS W MICROSCOPIC    Imaging Review No results found.   EKG Interpretation None      MDM   Final diagnoses:  Bipolar affective disorder, manic, severe, with psychotic behavior  Diabetes mellitus type 2 in obese  Embedded earring of left ear, initial encounter    Earring removed from the left earlobe after numbing the area and pushing the earring through. Patient tolerated procedure well. Home with topical Neosporin, followup as needed.  Filed Vitals:   08/10/14 0646 08/11/14 2229 08/11/14 0638 08/11/14 1932  BP: 123/74 134/83 106/83 127/82  Pulse: 119 90 103 83  Temp:  98.2 F (36.8 C)    TempSrc:  Oral    Resp: 18 18  17   Height:      Weight:      SpO2:    98%     I personally performed the services described in this documentation, which was scribed in my presence. The recorded information has been reviewed and is accurate.    Renold Genta, PA-C 08/11/14 1949

## 2014-08-11 NOTE — ED Notes (Signed)
From [facility]-was in manic phase, running around naked, and recently had ears pierced and a tattoo. Recurrent infections. Skin grew over left ear and hospitalist was unable to have left earring removed. On Keflex and Bactrim. Has had evening doses. Denies fever. Hx anemia, bipolar, DM II, seasonal allergies. With 1:1 sitter. BP 149/98 HR 115 RR 18 SpO2 96% on RA. Oral temp 98.2 F.

## 2014-08-11 NOTE — Progress Notes (Signed)
[facility] MD Progress Note  08/11/2014 1:03 PM [name]  MRN:  [id number] Subjective: " I have ear pain"  Objective: Patient seen and chart reviewed. Patient reports she feels that her mood is more stable. She reports that she has pain in her earlobe since she has both her earstuds entrapped in her earlobe with swelling and pain. Patient continues to have pressured speech ,with labile mood and hyperactivity. Patient continues to be disorganized ,but is improving. Patient reports she also has some cramps in her abdomen ,but is getting better. Per staff report patient continues to be found rambling at times. Patient also found to be disorganized ,hyperactive,manic with pressured speech.  Patient was examined and has BL earlobe swelling ,with ear studs entrapped in it. Patient complains of pain as well as irritability.    Diagnosis:   DSM5:  Primary psychiatric diagnosis:  Bipolar disorder,Type I , Currently in Manic phase,severe with psychotic features.   Non psychiatric diagnosis:  Prediabetes  Morbid obesity    Total Time spent with patient: 20 minutes    ADL's:  Impaired  Sleep: Fair  Appetite:  Fair  Suicidal Ideation:  denies Homicidal Ideation:  Denies  AEB (as evidenced by):  Psychiatric Specialty Exam: Physical Exam  Constitutional: She is oriented to person, place, and time. She appears well-developed and well-nourished.  HENT:  Head: Normocephalic.  Has swollen ,tender ear lobes.No discharge  Neurological: She is alert and oriented to person, place, and time.  Skin: Skin is warm.  Psychiatric: Her mood appears anxious. Her affect is labile. Her speech is rapid and/or pressured. She is hyperactive. Thought content is delusional. Cognition and memory are normal. She expresses impulsivity.    Review of Systems  HENT: Positive for ear pain.   Gastrointestinal: Positive for abdominal pain (reports some cramps,gas).  Psychiatric/Behavioral: Positive for  hallucinations (is delusional). Negative for suicidal ideas. The patient is nervous/anxious.     Blood pressure 106/83, pulse 103, temperature 98.2 F (36.8 C), temperature source Oral, resp. rate 18, height 5' 3.5" (1.613 m), weight 110.224 kg (243 lb).Body mass index is 42.37 kg/(m^2).  General Appearance: Disheveled  Eye Sport and exercise psychologist::  Fair  Speech:  Pressured  Volume:  Normal  Mood:  Anxious  Affect:  Labile  Thought Process:  Circumstantial, Irrelevant, Loose and Tangential  Orientation:  Full (Time, Place, and Person)  Thought Content:  Delusions  Suicidal Thoughts:  No  Homicidal Thoughts:  No  Memory:  Negative  Judgement:  Impaired  Insight:  Lacking  Psychomotor Activity:  Increased  Concentration:  Poor  Recall:  Poor  Fund of Knowledge:Fair  Language: Fair  Akathisia:  No    AIMS (if indicated):   0  Assets:  Desire for Improvement Social Support  Sleep:  Number of Hours: 2.25   Musculoskeletal: Strength & Muscle Tone: within normal limits Gait & Station: normal Patient leans: N/A  Current Medications: Current Facility-Administered Medications  Medication Dose Route Frequency Provider Last Rate Last Dose  . acetaminophen (TYLENOL) tablet 650 mg  650 mg Oral Q6H PRN Laverle Hobby, PA-C   650 mg at 08/10/14 2226  . ALPRAZolam Duanne Moron) tablet 0.25 mg  0.25 mg Oral BID PRN Ursula Alert, MD   0.25 mg at 08/09/14 2247  . alum & mag hydroxide-simeth (MAALOX/MYLANTA) 200-200-20 MG/5ML suspension 30 mL  30 mL Oral Q4H PRN Laverle Hobby, PA-C   30 mL at 08/11/14 0338  . benztropine (COGENTIN) tablet 1 mg  1 mg Oral BID Zahriyah Joo  Tangela Dolliver, MD   1 mg at 08/11/14 0800  . cephALEXin (KEFLEX) capsule 500 mg  500 mg Oral BID Janece Canterbury, MD      . diphenhydrAMINE (BENADRYL) capsule 25 mg  25 mg Oral Q8H PRN Ursula Alert, MD       Or  . diphenhydrAMINE (BENADRYL) injection 25 mg  25 mg Intramuscular Q8H PRN Ursula Alert, MD      . divalproex (DEPAKOTE ER) 24 hr tablet  500 mg  500 mg Oral BID Ursula Alert, MD   500 mg at 08/11/14 0759  . haloperidol (HALDOL) tablet 5 mg  5 mg Oral Q8H PRN Ursula Alert, MD       Or  . haloperidol lactate (HALDOL) injection 5 mg  5 mg Intramuscular Q8H PRN Andriy Sherk, MD      . loratadine (CLARITIN) tablet 10 mg  10 mg Oral Daily Laverle Hobby, PA-C   10 mg at 08/11/14 0800  . LORazepam (ATIVAN) tablet 1 mg  1 mg Oral Q8H PRN Ursula Alert, MD   1 mg at 08/11/14 1202   Or  . LORazepam (ATIVAN) injection 1 mg  1 mg Intramuscular Q8H PRN Tarris Delbene, MD      . magnesium hydroxide (MILK OF MAGNESIA) suspension 30 mL  30 mL Oral Daily PRN Laverle Hobby, PA-C   30 mL at 08/11/14 1202  . metFORMIN (GLUCOPHAGE) tablet 500 mg  500 mg Oral BID WC Laverle Hobby, PA-C   500 mg at 08/11/14 0759  . mupirocin cream (BACTROBAN) 2 %   Topical BID Laverle Hobby, PA-C   2 application at 00/93/81 8299  . norethindrone-ethinyl estradiol (JUNEL FE,GILDESS FE,LOESTRIN FE) 1-20 MG-MCG per tablet 1 tablet  1 tablet Oral Daily Laverle Hobby, PA-C   1 tablet at 08/10/14 0800  . OLANZapine zydis (ZYPREXA) disintegrating tablet 10 mg  10 mg Oral BID Ursula Alert, MD      . sulfamethoxazole-trimethoprim (BACTRIM DS) 800-160 MG per tablet 2 tablet  2 tablet Oral BID Janece Canterbury, MD      . traZODone (DESYREL) tablet 50 mg  50 mg Oral QHS,MR X 1 Spencer E Simon, PA-C   50 mg at 08/11/14 0009    Lab Results:  No results found for this or any previous visit (from the past 38 hour(s)).  Physical Findings: AIMS: Facial and Oral Movements Muscles of Facial Expression: None, normal Lips and Perioral Area: None, normal Jaw: None, normal Tongue: None, normal,Extremity Movements Upper (arms, wrists, hands, fingers): None, normal Lower (legs, knees, ankles, toes): None, normal, Trunk Movements Neck, shoulders, hips: None, normal, Overall Severity Severity of abnormal movements (highest score from questions above): None,  normal Incapacitation due to abnormal movements: None, normal Patient's awareness of abnormal movements (rate only patient's report): No Awareness, Dental Status Current problems with teeth and/or dentures?: No Does patient usually wear dentures?: No  CIWA:    COWS:     Treatment Plan Summary: Daily contact with patient to assess and evaluate symptoms and progress in treatment Medication management  Plan:  Will continue her on Depakote 500 mg po bid for mood swings. Will get depakote level on Sunday AM. Will discontinue Lamictal po.  Will increase Zyprexa Zydis 10  mg po bid. Will discontinue seroquel .  Will make available prn's for agitation.  Will continue xanax prn to bid and will continue to taper her off it once stable.  Reviewed previous charts in EMR.   Patient seen by  hospitalist -was able to take out one of her ear studs, but recommended sending her to [facility].        Medical Decision Making Problem Points:  Established problem, stable/improving (1), Review of last therapy session (1) and Review of psycho-social stressors (1) Data Points:  Order Aims Assessment (2) Review of medication regiment & side effects (2) Review of new medications or change in dosage (2)  I certify that inpatient services furnished can reasonably be expected to improve the patient's condition.   Roc Streett 08/11/2014, 1:03 PM

## 2014-08-11 NOTE — ED Notes (Signed)
From BH-report from [facility]-patient had recent manic episode, was running around naked, and had ears pierced and tattoo done recently. On Keflex and Bactrim currently. Hospitalist able to get right earring out but left earring is stuck with back of earring stuck in left ear. Ear reddened and swollen. VSS-BP 149/98 HR 89 RR 18 SpO2 96%. Temp 98.6 F orally. Hx bipolar, DM II, anemia, seasonal allergies.

## 2014-08-11 NOTE — ED Provider Notes (Signed)
Medical screening examination/treatment/procedure(s) were performed by non-physician practitioner and as supervising physician I was immediately available for consultation/collaboration.   EKG Interpretation None        [place], DO 08/11/14 2123

## 2014-08-11 NOTE — Progress Notes (Signed)
Patient ID: [name], female   DOB: 1976-09-09, 38 y.o.   MRN: [id number] 1-1 monitoring note. D. Pt remains 1.1. Monitoring for safety due to continued manic , disorganized thoughts. A. 1.1. Remains at bedside and patient is safe at this time.Currently resting quietly. R. Patient is safe, no further voiced concerns at this time. Will continue to monitor 1.1. As ordered.

## 2014-08-11 NOTE — ED Notes (Signed)
Pt states she feels as though her blood glucose is low. Patient is pale and diaphoretic. Given orange juice and graham crackers. Patient A&Ox4. In reclining position. VSS.

## 2014-08-11 NOTE — BHH Group Notes (Signed)
[facility] LCSW Aftercare Discharge Planning Group Note   08/11/2014 10:58 AM  Participation Quality:  Engaged  Mood/Affect:  euphoric  Depression Rating:  denies  Anxiety Rating:  denies  Thoughts of Suicide:  No Will you contract for safety?   NA  Current AVH:  No  Plan for Discharge/Comments:  [name] is in a good mood.  "Thank you for having me in group today.  It is wonderful to be here."  States she had many visitors yesterday, is disappointed that she cannot go home today or over the weekend, and is hopeful that she can leave on Monday.  She offered to have her husband bring in extra clothes for 2 patients who do not have anything here but scrubs.  Transportation Means: family  Supports: family  [place], Ernestine Mcmurray

## 2014-08-11 NOTE — Consult Note (Addendum)
[facility] Medical Consultation  [name] YOV:[id number] DOB: 12-31-1975 DOA: 08/08/2014 PCP: Cleda Mccreedy   Requesting physician: Ursula Alert Date of consultation:  08/11/2014 Reason for consultation: bilateral ear swelling  Impression/Recommendations Active Problems:   Bipolar 1 disorder, manic, moderate   Diabetes mellitus type 2 in obese   Seasonal allergies   Embedded earring of left ear    Bipolar 1 disorder, manic -  Management per psychiatry  Embedded earring of left ear -  Discussed with Dr. Erik Obey who recommended she be transferred to his office for lidocaine removal and earring extraction this afternoon, however, patient cannot be transported for office visit from [facility] to outpatient office -  [facility] not stocked with materials for procedures -  Transport patient to ER for lidocaine injection and forceps removal of earring -  Start bactrim+ keflex for 7 day course for possible cellulitis  Possible UTI, no UA and patient denies dysuria -  Would probably not treat unless UA grossly positive for infection  DM, well controlled, A1c 6.8, continue metformin   Allergic rhinitis, stable. Continue loratadine   Please call again if new questions or concerns arise.  Thank you for this consultation.    Chief Complaint:  mania  HPI:   38 year old WF who was brought to [facility] on 8/26 by her husband because she was hyperactive, walking around naked and using profanity.  She was yelling and throwing property.  Her assessment by psychiatry upon admission stated that she was manic with disorganization, pressured speech, insomnia, and delusions.  On admission H&P from 8/26, her ears are not mentioned.  On 8/27, around 7:30pm, a nursing note states that the patient was concerned that her left ear is red and swollen and painful.  Today, she is reporting both ears are swollen and painful.  We are consulted for assessment of her bilateral ears.    Patient states that she had  a ears pierced recently and that a friend accidentally pulled her left earring partly through her left ear and it has been bothering her for the last three days.  She states she had been twirling her earrings as recommended by the person who pierced her ears until her left one was pulled through.  She denies fevers, chills, nausea, vomiting, diarrhea, sinus congestion, sore throat.  Her earlobes hurt a little and are somewhat swollen.     Review of Systems:   Denies fevers, chills, weight loss or gain, changes to hearing and vision.  Denies rhinorrhea, sinus congestion, sore throat.  Denies chest pain and palpitations.  Denies SOB, wheezing, cough.  Denies nausea, vomiting, constipation, diarrhea.  Denies dysuria, frequency, urgency, polyuria, polydipsia.  Denies hematemesis, blood in stools, melena, abnormal bruising or bleeding.  Denies lymphadenopathy.  Denies arthralgias, myalgias.  Skin:  Per HPI Denies lower extremity edema.  Denies focal numbness, weakness, slurred speech, confusion, facial droop.  Denies anxiety and depression.    Past Medical History  Diagnosis Date  . Obesity   . Diabetes mellitus type 2 in obese   . Bipolar 1 disorder   . Seasonal allergies    Past Surgical History  Procedure Laterality Date  . Oophorectomy  2009   Social History:  reports that she has never smoked. She does not have any smokeless tobacco history on file. She reports that she drinks alcohol. She reports that she does not use illicit drugs.  No Known Allergies Family History  Problem Relation Age of Onset  . Heart disease Other   .  Macular degeneration Other   . Mental illness      both sides of family    Prior to Admission medications   Medication Sig Start Date End Date Taking? Authorizing Provider  ALPRAZolam (XANAX) 0.25 MG tablet Take 0.25 mg by mouth 3 (three) times daily as needed for anxiety.    Historical Provider, MD  benztropine (COGENTIN) 2 MG tablet Take 2 mg by mouth at  bedtime.    Historical Provider, MD  cetirizine (ZYRTEC) 10 MG tablet Take 10 mg by mouth every morning.    Historical Provider, MD  lamoTRIgine (LAMICTAL) 200 MG tablet Take 200 mg by mouth every morning.    Historical Provider, MD  norethindrone-ethinyl estradiol (JUNEL FE,GILDESS FE,LOESTRIN FE) 1-20 MG-MCG tablet Take 1 tablet by mouth daily.    Historical Provider, MD  QUEtiapine (SEROQUEL) 200 MG tablet Take 200 mg by mouth at bedtime.    Historical Provider, MD   Physical Exam: Blood pressure 106/83, pulse 103, temperature 98.2 F (36.8 C), temperature source Oral, resp. rate 18, height 5' 3.5" (1.613 m), weight 110.224 kg (243 lb). Filed Vitals:   08/10/14 0645 08/10/14 0646 08/11/14 0632 08/11/14 0638  BP: 127/82 123/74 134/83 106/83  Pulse: 102 119 90 103  Temp: 98.3 F (36.8 C)  98.2 F (36.8 C)   TempSrc: Oral  Oral   Resp: 18 18 18    Height:      Weight:         General:  WF, NAD  Eyes:  PERRL, anicteric, non-injected.  ENT:  Nares clear.  OP clear, non-erythematous without plaques or exudates.  MMM.  I removed the right earring which was tight which was a small stud.  Minimal swelling of the right earlobe.  Earring and ear covered in a thick coating of mupirocin.   Left earlobe is mildly swollen, not particularly pink or inflamed looking.  Earring, despite stretching anterior hole manually, could not be visualized.  Back and post are clearly visible posteriorly.  Again, everything covered in thick coat of mupirocin  Neck:  Supple without TM or JVD.    Lymph:  No cervical, supraclavicular, or submandibular LAD.  Cardiovascular:  RRR, normal S1, S2, without m/r/g.  2+ pulses, warm extremities  Respiratory:  CTA bilaterally without increased WOB.  Abdomen:  NABS.  Soft, ND/NT.    Skin:  No rashes or focal lesions.  Musculoskeletal:  Normal bulk and tone.  No LE edema.  Psychiatric:  A & O x 4.  Pressured speech and perseverates on not having her medications,  being misunderstood, frustrated in not having her ears looked at sooner.  Neurologic:  CN 3-12 intact.  5/5 strength.  Sensation intact.  Labs on Admission:  Basic Metabolic Panel:  Recent Labs Lab 08/08/14 0325  NA 134*  K 3.7  CL 96  CO2 20  GLUCOSE 164*  BUN 7  CREATININE 0.79  CALCIUM 9.6   Liver Function Tests:  Recent Labs Lab 08/08/14 0325  AST 23  ALT 14  ALKPHOS 68  BILITOT 0.4  PROT 8.2  ALBUMIN 4.0   No results found for this basename: LIPASE, AMYLASE,  in the last 168 hours No results found for this basename: AMMONIA,  in the last 168 hours CBC:  Recent Labs Lab 08/08/14 0325  WBC 14.1*  HGB 12.9  HCT 38.7  MCV 79.5  PLT 386   Cardiac Enzymes: No results found for this basename: CKTOTAL, CKMB, CKMBINDEX, TROPONINI,  in the last 168 hours  BNP: No components found with this basename: POCBNP,  CBG: No results found for this basename: GLUCAP,  in the last 168 hours  Radiological Exams on Admission: No results found.  EKG:  None performed  Time spent: 45 min  Sharvil Hoey [facility] Pager [phone]  If 7PM-7AM, please contact night-coverage [email or web address] Password [facility] 08/11/2014, 10:37 AM

## 2014-08-11 NOTE — Progress Notes (Signed)
Patient ID: [name], female   DOB: May 10, 1976, 38 y.o.   MRN: [id number] D. 1-1 monitoring Note. D. Pt presents with manic mood, affect labile. Patient presents with very pressured tangential speech. She states '' I am doing so much better, you see last night I had this gas that was keeping me up and interrupting my sleep but I took the milk of maalox and that really helped. Also I have this problem with my ears and I don't understand why no body has addressed it for three days. Why did I come in ? Oh well I was on a journey and I was all scrambled up, like scrambled eggs or trying to poach an egg or something. I'm much better now. I want to do whatever I have to to get home. OH you're reading me that's good!'' A. Medications given as ordered. Discussed above information and patients complaints with Dr. Shea Evans. Orders received and hospitalist notified regarding patients ear lobes/ earrings impacted in ear. Pt remains on 1.1. Monitoring due to continued manic and impulsive behaviors. R. Patient is safe will continue to monitor as ordered for safety.

## 2014-08-11 NOTE — BHH Group Notes (Signed)
[place] LCSW Group Therapy  08/11/2014 1:38 PM  Type of Therapy:  Group Therapy  Participation Level:  Did Not Attend-pt in room. Stated that she had a headache and wanted to stay in bed.  [place], Lanelle Lindo LCSWA 08/11/2014, 1:38 PM

## 2014-08-11 NOTE — Progress Notes (Signed)
Patient ID: [name], female   DOB: May 02, 1976, 38 y.o.   MRN: [id number] 1-1 monitoring note. D. Pt remains 1.1. Monitoring for safety due to continued manic , disorganized thoughts. A. 1.1. Remains at bedside and patient is safe at this time. R. Patient is safe, no further voiced concerns at this time. Will continue to monitor 1.1. As ordered.

## 2014-08-11 NOTE — Progress Notes (Signed)
Pt returned from [facility]. Pt states earlobe much better since earring removed, reddeness noted. Pt alert and oriented. Pt denies SI/HI and A/V hall @ present to Probation officer.  Pt c/o anxiety, abdominal discomfort and cramps. See MAR. Will continue to evaluate for stabilization and monitor closely. 1:1 Sitter present

## 2014-08-11 NOTE — Discharge Instructions (Signed)
Apply topical antibiotic ointment twice a day. Wash with soap and water. Follow up as needed.

## 2014-08-11 NOTE — ED Notes (Signed)
Pt stable and A&Ox4. No other complaints/concerns. Aware of transfer back to [facility].

## 2014-08-11 NOTE — ED Notes (Signed)
Bed: WTR6 Expected date:  Expected time:  Means of arrival:  Comments: 

## 2014-08-12 LAB — GLUCOSE, CAPILLARY
GLUCOSE-CAPILLARY: 109 mg/dL — AB (ref 70–99)
Glucose-Capillary: 107 mg/dL — ABNORMAL HIGH (ref 70–99)
Glucose-Capillary: 120 mg/dL — ABNORMAL HIGH (ref 70–99)

## 2014-08-12 NOTE — Progress Notes (Signed)
I reviewed the note and agree with the A&P.

## 2014-08-12 NOTE — Progress Notes (Signed)
Patient ID: [name], female   DOB: [birth year], 38 y.o.   MRN: [id number] 1-1 monitoring note. D. Pt remains 1.1. Monitoring for safety due to continued manic , disorganized thoughts.  A. 1.1. Remains at bedside and patient is safe at this time.Currently resting quietly. R. Patient is safe, no further voiced concerns at this time. Will continue to monitor 1.1. As ordered

## 2014-08-12 NOTE — Progress Notes (Signed)
[place] Group Notes:  (Nursing/MHT/Case Management/Adjunct)  Date:  08/12/2014  Time:  2:50 PM  Type of Therapy:  Psychoeducational Skills  Participation Level:  Active  Participation Quality:  Appropriate and Attentive  Affect:  Appropriate  Cognitive:  Appropriate  Insight:  Appropriate  Engagement in Group:  Engaged  Modes of Intervention:  Activity  Summary of Progress/Problems: Pts played Pictionary using coping skills. Pts were able to interact and build social skills while playing an activity.  Clint Bolder 08/12/2014, 2:50 PM

## 2014-08-12 NOTE — Progress Notes (Signed)
Pt remains on 1:1 staff at bedside. Pt resting in bed. No distress noted. Will continue to monitor closely and maintain safe environment.

## 2014-08-12 NOTE — Progress Notes (Signed)
The focus of this group is to help patients review their daily goal of treatment and discuss progress on daily workbooks. Pt attended the evening group session and responded to all discussion prompts from the [facility]. Pt told the Writer that today was a good day due to there being good groups and her husband visiting her. Pt also told the group that her boss was being especially supportive of her seeking treatment and was allowing her time off work. Pt had no additional requests from Nursing Staff this evening. Pt's affect was appropriate.

## 2014-08-12 NOTE — BHH Group Notes (Signed)
[place] Group Notes:  (Nursing/MHT/Case Management/Adjunct)  Date:  08/12/2014  Time:  10:57 AM  Type of Therapy:  Psychoeducational Skills  Participation Level:  Did Not Attend  Ventura Sellers 08/12/2014, 10:57 AM

## 2014-08-12 NOTE — BHH Group Notes (Signed)
[place] Group Notes: (Clinical Social Work)   08/12/2014      Type of Therapy:  Group Therapy   Participation Level:  Did Not Attend    Selmer Dominion, LCSW 08/12/2014, 4:35 PM

## 2014-08-12 NOTE — Progress Notes (Addendum)
1:1 Monitoring Note: D: Patient remains on 1:1 for safety due to continues manic behavior and disorganized thought. Patient complained of twitching which "started few minutes ago" and heart burn. Accepted Benadryl and mylanta PO PRN for the above complaint. Family member visited, gone home with patient belongings from the locker. Patient attend group tonight. Will continue to monitor patient as ordered.

## 2014-08-12 NOTE — Progress Notes (Signed)
Patient ID: [name], female   DOB: [birth year], 38 y.o.   MRN: [id number] 1-1 monitoring note. D. Pt remains 1.1. Monitoring for safety due to continued manic , disorganized thoughts. A. 1.1. Remains at bedside and patient is safe at this time. R. Patient is safe, no further voiced concerns at this time. Will continue to monitor 1.1. As ordered

## 2014-08-12 NOTE — Progress Notes (Signed)
Patient ID: [name], female   DOB: Feb 12, 1976, 38 y.o.   MRN: [id number] 1-1 monitoring note. D. Pt remains 1.1. Monitoring for safety due to continued manic , disorganized thoughts. Her speech is less pressured, and her thoughts are more coherent today. She does report that she slept well last night.  A. 1.1. Remains at bedside and patient is safe at this time.Currently resting quietly. R. Patient is safe, no further voiced concerns at this time. Will continue to monitor 1.1. As ordered.

## 2014-08-12 NOTE — Progress Notes (Signed)
Patient ID: [name], female   DOB: 09-09-1976, 38 y.o.   MRN: [id number] [facility] MD Progress Note  08/12/2014 3:35 PM [name]  MRN:  [id number] Subjective: Met with patient who continues to be on 1:1. She states she slept great! Her appetite is coming back. She notes that she is not depressed, and has no SI/HI and that the AVH is decreasing. [name] says her ears feel better after being seen in the ED. Better today but reporting feeling anxious but less than upon admission. She states that sleeping has helped.  Objective: Patient seen and chart reviewed. Patient continues to have pressured speech with flight of ideas. She is difficult to redirect, but reporting feeling anxious but less than upon admission. She states that sleeping has helped. Patient was examined and has BL earlobe swelling ,with ear studs entrapped in it. Less irritable today, organized x 3. Continues to be hypo manic.   Diagnosis:   DSM5:  Primary psychiatric diagnosis:  Bipolar disorder,Type I , Currently in Manic phase,severe with psychotic features.   Non psychiatric diagnosis:  Prediabetes  Morbid obesity Total Time spent with patient: 20 minutes ADL's:  impaired  Sleep: "great!" Appetite:  " coming back."  Suicidal Ideation:  denies Homicidal Ideation:  Denies  AEB (as evidenced by):  Psychiatric Specialty Exam: Physical Exam  Constitutional: She is oriented to person, place, and time. She appears well-developed and well-nourished.  HENT:  Head: Normocephalic.  Has swollen ,tender ear lobes.No discharge  Neurological: She is alert and oriented to person, place, and time.  Skin: Skin is warm.  Psychiatric: Her mood appears anxious. Her affect is labile. Her speech is rapid and/or pressured. She is hyperactive. Thought content is delusional. Cognition and memory are normal. She expresses impulsivity.    Review of Systems  HENT: Positive for ear pain.   Gastrointestinal: Positive for abdominal pain  (reports some cramps,gas).  Psychiatric/Behavioral: Positive for hallucinations (is delusional). Negative for suicidal ideas. The patient is nervous/anxious.     Blood pressure 133/83, pulse 118, temperature 98.5 F (36.9 C), temperature source Oral, resp. rate 16, height 5' 3.5" (1.613 m), weight 110.224 kg (243 lb), SpO2 98.00%.Body mass index is 42.37 kg/(m^2).  General Appearance: Disheveled  Eye Sport and exercise psychologist::  Fair  Speech:  Pressured  Volume:  Normal  Mood:  Anxious  Affect:  Labile  Thought Process:  Circumstantial, Irrelevant, Loose and Tangential  Orientation:  Full (Time, Place, and Person)  Thought Content:  Delusions  Suicidal Thoughts:  No  Homicidal Thoughts:  No  Memory:  Negative  Judgement:  Impaired  Insight:  Lacking  Psychomotor Activity:  Increased  Concentration:  Poor  Recall:  Poor  Fund of Knowledge:Fair  Language: Fair  Akathisia:  No    AIMS (if indicated):   0  Assets:  Desire for Improvement Social Support  Sleep:  Number of Hours: 6.75   Musculoskeletal: Strength & Muscle Tone: within normal limits Gait & Station: normal Patient leans: N/A  Current Medications: Current Facility-Administered Medications  Medication Dose Route Frequency Provider Last Rate Last Dose  . acetaminophen (TYLENOL) tablet 650 mg  650 mg Oral Q6H PRN Laverle Hobby, PA-C   650 mg at 08/10/14 2226  . ALPRAZolam Duanne Moron) tablet 0.25 mg  0.25 mg Oral BID PRN Ursula Alert, MD   0.25 mg at 08/11/14 2207  . alum & mag hydroxide-simeth (MAALOX/MYLANTA) 200-200-20 MG/5ML suspension 30 mL  30 mL Oral Q4H PRN Laverle Hobby, PA-C   30 mL  at 08/11/14 2210  . benztropine (COGENTIN) tablet 1 mg  1 mg Oral BID Ursula Alert, MD   1 mg at 08/12/14 0757  . cephALEXin (KEFLEX) capsule 500 mg  500 mg Oral BID Janece Canterbury, MD   500 mg at 08/12/14 0756  . diphenhydrAMINE (BENADRYL) capsule 25 mg  25 mg Oral Q8H PRN Ursula Alert, MD       Or  . diphenhydrAMINE (BENADRYL) injection  25 mg  25 mg Intramuscular Q8H PRN Saramma Eappen, MD      . divalproex (DEPAKOTE ER) 24 hr tablet 500 mg  500 mg Oral BID Ursula Alert, MD   500 mg at 08/12/14 0757  . haloperidol (HALDOL) tablet 5 mg  5 mg Oral Q8H PRN Ursula Alert, MD       Or  . haloperidol lactate (HALDOL) injection 5 mg  5 mg Intramuscular Q8H PRN Saramma Eappen, MD      . loratadine (CLARITIN) tablet 10 mg  10 mg Oral Daily Laverle Hobby, PA-C   10 mg at 08/12/14 0757  . LORazepam (ATIVAN) tablet 1 mg  1 mg Oral Q8H PRN Ursula Alert, MD   1 mg at 08/12/14 0757   Or  . LORazepam (ATIVAN) injection 1 mg  1 mg Intramuscular Q8H PRN Saramma Eappen, MD      . magnesium hydroxide (MILK OF MAGNESIA) suspension 30 mL  30 mL Oral Daily PRN Laverle Hobby, PA-C   30 mL at 08/11/14 1202  . metFORMIN (GLUCOPHAGE) tablet 500 mg  500 mg Oral BID WC Laverle Hobby, PA-C   500 mg at 08/12/14 0757  . mupirocin cream (BACTROBAN) 2 %   Topical BID Laverle Hobby, PA-C      . norethindrone-ethinyl estradiol (JUNEL FE,GILDESS FE,LOESTRIN FE) 1-20 MG-MCG per tablet 1 tablet  1 tablet Oral Daily Laverle Hobby, PA-C   1 tablet at 08/12/14 0805  . OLANZapine zydis (ZYPREXA) disintegrating tablet 10 mg  10 mg Oral BID Ursula Alert, MD   10 mg at 08/12/14 0757  . sulfamethoxazole-trimethoprim (BACTRIM DS) 800-160 MG per tablet 2 tablet  2 tablet Oral BID Janece Canterbury, MD   2 tablet at 08/12/14 0756  . traZODone (DESYREL) tablet 50 mg  50 mg Oral QHS,MR X 1 Laverle Hobby, PA-C   50 mg at 08/11/14 2159    Lab Results:  Results for orders placed during the hospital encounter of 08/08/14 (from the past 48 hour(s))  URINALYSIS W MICROSCOPIC     Status: Abnormal   Collection Time    08/11/14  6:54 AM      Result Value Ref Range   Color, Urine YELLOW  YELLOW   APPearance CLOUDY (*) CLEAR   Specific Gravity, Urine 1.004 (*) 1.005 - 1.030   pH 7.0  5.0 - 8.0   Glucose, UA NEGATIVE  NEGATIVE mg/dL   Hgb urine dipstick NEGATIVE   NEGATIVE   Bilirubin Urine NEGATIVE  NEGATIVE   Ketones, ur NEGATIVE  NEGATIVE mg/dL   Protein, ur NEGATIVE  NEGATIVE mg/dL   Urobilinogen, UA 0.2  0.0 - 1.0 mg/dL   Nitrite NEGATIVE  NEGATIVE   Leukocytes, UA NEGATIVE  NEGATIVE   Comment: Performed at [facility], ED     Status: Abnormal   Collection Time    08/11/14  8:30 PM      Result Value Ref Range   Glucose-Capillary 183 (*) 70 - 99 mg/dL  GLUCOSE, CAPILLARY  Status: Abnormal   Collection Time    08/12/14 12:06 PM      Result Value Ref Range   Glucose-Capillary 107 (*) 70 - 99 mg/dL   Comment 1 Notify RN      Physical Findings: AIMS: Facial and Oral Movements Muscles of Facial Expression: None, normal Lips and Perioral Area: None, normal Jaw: None, normal Tongue: None, normal,Extremity Movements Upper (arms, wrists, hands, fingers): None, normal Lower (legs, knees, ankles, toes): None, normal, Trunk Movements Neck, shoulders, hips: None, normal, Overall Severity Severity of abnormal movements (highest score from questions above): None, normal Incapacitation due to abnormal movements: None, normal Patient's awareness of abnormal movements (rate only patient's report): No Awareness, Dental Status Current problems with teeth and/or dentures?: No Does patient usually wear dentures?: No  CIWA:    COWS:     Treatment Plan Summary: Daily contact with patient to assess and evaluate symptoms and progress in treatment Medication management  Plan: Will continue 1:1 due to mania and unpredictable behavior. Will continue her on Depakote 500 mg po bid for mood swings. Will get depakote level on Sunday AM. Will discontinue Lamictal po.  Will increase Zyprexa Zydis 10  mg po bid. Will make available prn's for agitation.  Will continue xanax prn to bid and will continue to taper her off it once stable.  Marlane Hatcher. Thera Basden RPAC 3:41 PM 08/12/2014        Medical Decision  Making Problem Points:  Established problem, stable/improving (1), Review of last therapy session (1) and Review of psycho-social stressors (1) Data Points:  Order Aims Assessment (2) Review of medication regiment & side effects (2) Review of new medications or change in dosage (2)  I certify that inpatient services furnished can reasonably be expected to improve the patient's condition.   Kenai Fluegel 08/12/2014, 3:35 PM

## 2014-08-12 NOTE — [facility] Group Notes (Signed)
[facility] Group Notes:  (Nursing/MHT/Case Management/Adjunct)  Date:  08/12/2014  Time:  5:19 PM  Type of Therapy:  Psychoeducational Skills  Participation Level:  Did Not Attend  Ventura Sellers 08/12/2014, 5:19 PM

## 2014-08-12 NOTE — Progress Notes (Signed)
Pt awake in dayroom. Affect and mood pleasant. Denies pain or discomfort. Will continue to evaluate for stabilization. Pt remains on 1:1 staff at side.

## 2014-08-13 DIAGNOSIS — F312 Bipolar disorder, current episode manic severe with psychotic features: Secondary | ICD-10-CM

## 2014-08-13 LAB — GLUCOSE, CAPILLARY
GLUCOSE-CAPILLARY: 118 mg/dL — AB (ref 70–99)
GLUCOSE-CAPILLARY: 112 mg/dL — AB (ref 70–99)
Glucose-Capillary: 93 mg/dL (ref 70–99)
Glucose-Capillary: 121 mg/dL — ABNORMAL HIGH (ref 70–99)

## 2014-08-13 LAB — VALPROIC ACID LEVEL: Valproic Acid Lvl: 47.8 ug/mL — ABNORMAL LOW (ref 50.0–100.0)

## 2014-08-13 NOTE — Progress Notes (Signed)
Patient ID: Lenord Carbo, female   DOB: [birth year], 38 y.o.   MRN: [id number] [facility] MD Progress Note  08/13/2014 2:37 PM Avabella Wailes  MRN:  [id number] Subjective:  Patient states "My mood is starting to feel more stable. I am sleeping better. I think my husband missed some triggers with me. I went on a trip with my mother who set me off. I am thinking more clearly now. I am just tired from the medication and everything that has happened from being manic."   Objective: Patient seen and chart reviewed. She is assessed in her room. Patient is much less irritable, speech is less pressured and thought processes are becoming goal directed. Spoke with nursing staff who feel patient is stable enough to be taken off 1:1 observation. The patient has been showing steady improvement in her symptoms as medications have been adjusted. Her Depakote level is nearly therapeutic at 47.8. She has been able to engage in group therapy. Patient is compliant with her medications. She complains of some mild sedation related to medications but also admits to poor sleep for a period of time when severely manic. Patient talked about how some of her thoughts could have been taken as delusional on admission stating "I have had an interest in another man. I have been emotionally estranged from my husband for a period of time."  Diagnosis:   DSM5:  Primary psychiatric diagnosis:  Bipolar disorder,Type I , Currently in Manic phase,severe with psychotic features.   Non psychiatric diagnosis:  Prediabetes  Morbid obesity Total Time spent with patient: 20 minutes ADL's:  Improved   Sleep: Fair  Appetite: Good  Suicidal Ideation:  denies Homicidal Ideation:  Denies  AEB (as evidenced by):  Psychiatric Specialty Exam: Physical Exam  HENT:  Has swollen ,tender ear lobes.No discharge  Skin: Skin is dry.  Psychiatric: She has a normal mood and affect. Her speech is normal and behavior is normal. Judgment and thought  content normal. Cognition and memory are normal.    Review of Systems  Constitutional: Negative.   HENT: Positive for ear pain.   Eyes: Negative.   Respiratory: Negative.   Cardiovascular: Negative.   Gastrointestinal: Negative.  Negative for abdominal pain (reports some cramps,gas).  Genitourinary: Negative.   Musculoskeletal: Negative.   Skin: Negative.   Neurological: Negative.   Endo/Heme/Allergies: Negative.   Psychiatric/Behavioral: Negative for suicidal ideas and hallucinations (is delusional). The patient is nervous/anxious.     Blood pressure 128/85, pulse 107, temperature 98.2 F (36.8 C), temperature source Oral, resp. rate 18, height 5' 3.5" (1.613 m), weight 110.224 kg (243 lb), SpO2 98.00%.Body mass index is 42.37 kg/(m^2).  General Appearance: Casual  Eye Contact::  Fair  Speech:  Clear and Coherent and Normal Rate  Volume:  Normal  Mood:  Anxious  Affect:  Congruent  Thought Process:  Coherent and Goal Directed  Orientation:  Full (Time, Place, and Person)  Thought Content:  WDL  Suicidal Thoughts:  No  Homicidal Thoughts:  No  Memory:  Negative  Judgement:  Impaired  Insight:  Lacking  Psychomotor Activity:  Increased  Concentration:  Poor  Recall:  Poor  Fund of Knowledge:Fair  Language: Fair  Akathisia:  No    AIMS (if indicated):   0  Assets:  Communication Skills Desire for Improvement Housing Intimacy Physical Health Resilience Social Support  Sleep:  Number of Hours: 4.5   Musculoskeletal: Strength & Muscle Tone: within normal limits Gait & Station: normal Patient leans: N/A  Current  Medications: Current Facility-Administered Medications  Medication Dose Route Frequency Provider Last Rate Last Dose  . acetaminophen (TYLENOL) tablet 650 mg  650 mg Oral Q6H PRN Laverle Hobby, PA-C   650 mg at 08/10/14 2226  . ALPRAZolam Duanne Moron) tablet 0.25 mg  0.25 mg Oral BID PRN Ursula Alert, MD   0.25 mg at 08/11/14 2207  . alum & mag  hydroxide-simeth (MAALOX/MYLANTA) 200-200-20 MG/5ML suspension 30 mL  30 mL Oral Q4H PRN Laverle Hobby, PA-C   30 mL at 08/11/14 2210  . benztropine (COGENTIN) tablet 1 mg  1 mg Oral BID Ursula Alert, MD   1 mg at 08/12/14 0757  . cephALEXin (KEFLEX) capsule 500 mg  500 mg Oral BID Janece Canterbury, MD   500 mg at 08/12/14 0756  . diphenhydrAMINE (BENADRYL) capsule 25 mg  25 mg Oral Q8H PRN Ursula Alert, MD       Or  . diphenhydrAMINE (BENADRYL) injection 25 mg  25 mg Intramuscular Q8H PRN Saramma Eappen, MD      . divalproex (DEPAKOTE ER) 24 hr tablet 500 mg  500 mg Oral BID Ursula Alert, MD   500 mg at 08/12/14 0757  . haloperidol (HALDOL) tablet 5 mg  5 mg Oral Q8H PRN Ursula Alert, MD       Or  . haloperidol lactate (HALDOL) injection 5 mg  5 mg Intramuscular Q8H PRN Saramma Eappen, MD      . loratadine (CLARITIN) tablet 10 mg  10 mg Oral Daily Laverle Hobby, PA-C   10 mg at 08/12/14 0757  . LORazepam (ATIVAN) tablet 1 mg  1 mg Oral Q8H PRN Ursula Alert, MD   1 mg at 08/12/14 0757   Or  . LORazepam (ATIVAN) injection 1 mg  1 mg Intramuscular Q8H PRN Saramma Eappen, MD      . magnesium hydroxide (MILK OF MAGNESIA) suspension 30 mL  30 mL Oral Daily PRN Laverle Hobby, PA-C   30 mL at 08/11/14 1202  . metFORMIN (GLUCOPHAGE) tablet 500 mg  500 mg Oral BID WC Laverle Hobby, PA-C   500 mg at 08/12/14 0757  . mupirocin cream (BACTROBAN) 2 %   Topical BID Laverle Hobby, PA-C      . norethindrone-ethinyl estradiol (JUNEL FE,GILDESS FE,LOESTRIN FE) 1-20 MG-MCG per tablet 1 tablet  1 tablet Oral Daily Laverle Hobby, PA-C   1 tablet at 08/12/14 0805  . OLANZapine zydis (ZYPREXA) disintegrating tablet 10 mg  10 mg Oral BID Ursula Alert, MD   10 mg at 08/12/14 0757  . sulfamethoxazole-trimethoprim (BACTRIM DS) 800-160 MG per tablet 2 tablet  2 tablet Oral BID Janece Canterbury, MD   2 tablet at 08/12/14 0756  . traZODone (DESYREL) tablet 50 mg  50 mg Oral QHS,MR X 1 Laverle Hobby,  PA-C   50 mg at 08/11/14 2159    Lab Results:  Results for orders placed during the hospital encounter of 08/08/14 (from the past 48 hour(s))  URINALYSIS W MICROSCOPIC     Status: Abnormal   Collection Time    08/11/14  6:54 AM      Result Value Ref Range   Color, Urine YELLOW  YELLOW   APPearance CLOUDY (*) CLEAR   Specific Gravity, Urine 1.004 (*) 1.005 - 1.030   pH 7.0  5.0 - 8.0   Glucose, UA NEGATIVE  NEGATIVE mg/dL   Hgb urine dipstick NEGATIVE  NEGATIVE   Bilirubin Urine NEGATIVE  NEGATIVE   Ketones,  ur NEGATIVE  NEGATIVE mg/dL   Protein, ur NEGATIVE  NEGATIVE mg/dL   Urobilinogen, UA 0.2  0.0 - 1.0 mg/dL   Nitrite NEGATIVE  NEGATIVE   Leukocytes, UA NEGATIVE  NEGATIVE   Comment: Performed at [facility]  [facility] MONITORING, ED     Status: Abnormal   Collection Time    08/11/14  8:30 PM      Result Value Ref Range   Glucose-Capillary 183 (*) 70 - 99 mg/dL  GLUCOSE, CAPILLARY     Status: Abnormal   Collection Time    08/12/14 12:06 PM      Result Value Ref Range   Glucose-Capillary 107 (*) 70 - 99 mg/dL   Comment 1 Notify RN      Physical Findings: AIMS: Facial and Oral Movements Muscles of Facial Expression: None, normal Lips and Perioral Area: None, normal Jaw: None, normal Tongue: None, normal,Extremity Movements Upper (arms, wrists, hands, fingers): None, normal Lower (legs, knees, ankles, toes): None, normal, Trunk Movements Neck, shoulders, hips: None, normal, Overall Severity Severity of abnormal movements (highest score from questions above): None, normal Incapacitation due to abnormal movements: None, normal Patient's awareness of abnormal movements (rate only patient's report): No Awareness, Dental Status Current problems with teeth and/or dentures?: No Does patient usually wear dentures?: No  CIWA:    COWS:     Treatment Plan Summary: Daily contact with patient to assess and evaluate symptoms and progress in treatment Medication  management  Plan:  1. Continue crisis management and stabilization.  2. Medication management:  -Continue Cogentin 1 mg BID for EPS prevention -Continue Depakote 500 mg BID for improved mood stability.  -Continue Zyprexa Zydis 10 mg BID for psychosis 3. Encouraged patient to attend groups and participate in group counseling sessions and activities.  4. Discharge plan in progress.  5. Continue current treatment plan.  6. Address health issues: Continue treatment with antibiotics for infected ear lobes  7. D/C 1:1 due to improvement in behaviors   Medical Decision Making Problem Points:  Established problem, stable/improving (1), Review of last therapy session (1) and Review of psycho-social stressors (1) Data Points:  Order Aims Assessment (2) Review of medication regiment & side effects (2) Review of new medications or change in dosage (2)  I certify that inpatient services furnished can reasonably be expected to improve the patient's condition.   Lasaro Primm NP-C 08/13/2014, 2:37 PM

## 2014-08-13 NOTE — Progress Notes (Signed)
[facility] 1:1 Observation Documentation  For the first (8) hours following discontinuation of 1:1 precautions, a progress note entry by nursing staff should be documented at least every 2 hours, reflecting the patient's behavior, condition, mood, and conversation.  Use the progress notes for additional entries.  Time 1:1 discontinued:  1139  Patient's Behavior:  Calm and cooperative  Patient's Condition: appropriate   Patient's Conversation:  ''i'm fine''  Leonia Reader 08/13/2014, 5:41 PM

## 2014-08-13 NOTE — Progress Notes (Signed)
I have reviewed the note and agree with the A&P except as noted below. Na level decreased will order BMP to recheck level in the AM.

## 2014-08-13 NOTE — [facility] Group Notes (Signed)
[place] Group Notes:  (Nursing/MHT/Case Management/Adjunct)  Date:  08/13/2014  Time:  11:11 AM  Type of Therapy:  Psychoeducational Skills  Participation Level:  Active  Participation Quality:  Appropriate  Affect:  Appropriate  Cognitive:  Appropriate  Insight:  Appropriate  Engagement in Group:  Engaged  Modes of Intervention:  Discussion  Summary of Progress/Problems: Pt did attend self inventory group, pt reported that she was negative SI/HI, no AH/VH noted.   Benancio Deeds Shanta 08/13/2014, 11:11 AM

## 2014-08-13 NOTE — B[facility] Group Notes (Signed)
[facility] Group Notes:  (Nursing/MHT/Case Management/Adjunct)  Date:  08/13/2014  Time:  11:25 AM  Type of Therapy:  Nurse Education  Participation Level:  Active  Participation Quality:  Appropriate  Affect:  Appropriate  Cognitive:  Appropriate  Insight:  Appropriate  Engagement in Group:  Engaged  Modes of Intervention:  Discussion  Summary of Progress/Problems: PT DID ATTEND HEALTHY SUPPORT SYSTEMS GROUP.   Benancio Deeds Shanta 08/13/2014, 11:25 AM

## 2014-08-13 NOTE — Progress Notes (Signed)
1:1 Monitoring note: patient still on 1:1 observation for safety as a result of manic behavior and disorganized thoughts. Lying in bed, eyes closed. Respiration even and unlabored. No distress/manic behavior noted. Will continue to monitor patient.

## 2014-08-13 NOTE — Progress Notes (Signed)
Patient ID: [name], female   DOB: 19-Dec-1975, 38 y.o.   MRN: [id number] D. Discussed patients progress with L. Rosana Hoes NP. Pt continues to show improvement. Pt speech less pressured and goal oriented. Orders received for discontinuation of 1.1 at this time. Will continue to monitor patient closely. Pt verbalized understanding of change in level of observation and states '' I am glad to be getting better. '''

## 2014-08-13 NOTE — Progress Notes (Signed)
[facility] 1:1 Observation Documentation  For the first (8) hours following discontinuation of 1:1 precautions, a progress note entry by nursing staff should be documented at least every 2 hours, reflecting the patient's behavior, condition, mood, and conversation.  Use the progress notes for additional entries.  Time 1:1 discontinued:  1139  Patient's Behavior:  Calm cooperative  Patient's Condition:  In no acute distress  Patient's Conversation:  i'm glad i'm getting better.   [name] 08/13/2014, 1:16 PM

## 2014-08-13 NOTE — Progress Notes (Signed)
D: Patient very cheerful and pleasant this evening. Denies pain, SI, AH/VH. Patient stated " I think I will be going home tomorrow. I did pretty good. I got out of 1:1 so quick. Awesome" clapping for herself. A: Patient was encouraged to  continue with her medications as ordered and to verbalize her needs to staff. R: Patient very receptive. Will continue to monitor patient.

## 2014-08-13 NOTE — Progress Notes (Signed)
1:1 Morning Note. D: Patient remain on 1:1 monitoring for safety due to continous manic behavior and disorganized thoughts. Patient lying in bed. Eyes closed. Respiration even. No manic behavior noted thus far. No new compliant. Will continue to monitor patient.

## 2014-08-13 NOTE — Progress Notes (Signed)
Adult Psychoeducational Group Note  Date:  08/13/2014 Time:  9:37 PM  Group Topic/Focus:  Wrap-Up Group:   The focus of this group is to help patients review their daily goal of treatment and discuss progress on daily workbooks.  Participation Level:  Active  Participation Quality:  Appropriate  Affect:  Appropriate  Cognitive:  Appropriate  Insight: Appropriate  Engagement in Group:  Engaged  Modes of Intervention:  Discussion  Additional Comments: The patient expressed that the groups were good.The patient also said today was good but she is looking forward to bedtime.  Nash Shearer 08/13/2014, 9:37 PM

## 2014-08-13 NOTE — Progress Notes (Signed)
Adult Activity Group Note   Date: 08/13/2014 Time: 11:15am   Group Topic: Wellness - Jeopardy   Participation Level: Active  Participation Quality: Appropriate  Affect: Appropriate  Activity: Patients participated in jeopardy like game, answering questions relating to wellness in categories such as Fruits, Fitness, Veggies, Exercise, and Food Safety   Additional comments:

## 2014-08-13 NOTE — Progress Notes (Signed)
Patient ID: [name], female   DOB: 1976-04-19, 38 y.o.   MRN: [id number] 1-1 monitoring note. D. Pt remains 1.1. Monitoring for safety .. A. 1.1. Remains at bedside and patient is safe at this time. She is currently in her room with staff and nursing student. R. Patient is safe, no further voiced concerns at this time. Will continue to monitor 1.1. As ordered

## 2014-08-13 NOTE — B[facility] Group Notes (Signed)
[facility] Group Notes:  (Clinical Social Work)  08/13/2014   11:15am-12:00pm  Summary of Progress/Problems:  The main focus of today's process group was to listen to a variety of genres of music and to identify that different types of music provoke different responses.  The patient then was able to identify personally what was soothing for them, as well as energizing.  Handouts were used to record feelings evoked, as well as how patient can personally use this knowledge in sleep habits, with depression, and with other symptoms.  The patient expressed understanding of concepts, as well as knowledge of how each type of music affected him/her and how this can be used at home as a wellness/recovery tool.  Type of Therapy:  Music Therapy   Participation Level:  Active  Participation Quality:  Attentive and Sharing  Affect:  Blunted  Cognitive:  Oriented  Insight:  Engaged  Engagement in Therapy:  Engaged  Modes of Intervention:   Activity, Exploration  Selmer Dominion, LCSW 08/13/2014, 12:30pm

## 2014-08-14 DIAGNOSIS — F3112 Bipolar disorder, current episode manic without psychotic features, moderate: Principal | ICD-10-CM

## 2014-08-14 LAB — BASIC METABOLIC PANEL
Anion gap: 16 — ABNORMAL HIGH (ref 5–15)
BUN: 7 mg/dL (ref 6–23)
CALCIUM: 9.6 mg/dL (ref 8.4–10.5)
CO2: 23 mEq/L (ref 19–32)
Chloride: 98 mEq/L (ref 96–112)
Creatinine, Ser: 0.85 mg/dL (ref 0.50–1.10)
GFR calc Af Amer: >90 mL/min (ref >90)
GFR calc non Af Amer: 87 mL/min — ABNORMAL LOW (ref >90)
Glucose, Bld: 94 mg/dL (ref 70–99)
Potassium: 4.4 mEq/L (ref 3.7–5.3)
Sodium: 137 mEq/L (ref 137–147)

## 2014-08-14 LAB — GLUCOSE, CAPILLARY
GLUCOSE-CAPILLARY: 103 mg/dL — AB (ref 70–99)
Glucose-Capillary: 81 mg/dL (ref 70–99)

## 2014-08-14 MED ORDER — TRAZODONE HCL 50 MG PO TABS: 50.0000 mg | ORAL_TABLET | Freq: Every evening | ORAL | Status: DC | PRN

## 2014-08-14 MED ORDER — CETIRIZINE HCL 10 MG PO TABS: 10.0000 mg | ORAL_TABLET | Freq: Every morning | ORAL | Status: DC

## 2014-08-14 MED ORDER — BENZTROPINE MESYLATE 1 MG PO TABS: 1.0000 mg | ORAL_TABLET | Freq: Two times a day (BID) | ORAL | Status: DC

## 2014-08-14 MED ORDER — METFORMIN HCL 500 MG PO TABS: 500.0000 mg | ORAL_TABLET | Freq: Two times a day (BID) | ORAL | Status: DC

## 2014-08-14 MED ORDER — CEPHALEXIN 500 MG PO CAPS: 500.0000 mg | ORAL_CAPSULE | Freq: Two times a day (BID) | ORAL | Status: DC

## 2014-08-14 MED ORDER — SULFAMETHOXAZOLE-TMP DS 800-160 MG PO TABS: 2.0000 | ORAL_TABLET | Freq: Two times a day (BID) | ORAL | Status: DC

## 2014-08-14 MED ORDER — NORETHIN ACE-ETH ESTRAD-FE 1-20 MG-MCG PO TABS: 1.0000 | ORAL_TABLET | Freq: Every day | ORAL | Status: DC

## 2014-08-14 MED ORDER — DIVALPROEX SODIUM ER 500 MG PO TB24: 500.0000 mg | ORAL_TABLET | Freq: Two times a day (BID) | ORAL | Status: DC

## 2014-08-14 MED ORDER — MUPIROCIN CALCIUM 2 % EX CREA: TOPICAL_CREAM | Freq: Two times a day (BID) | CUTANEOUS | Status: DC

## 2014-08-14 MED ORDER — OLANZAPINE 10 MG PO TBDP: 10.0000 mg | ORAL_TABLET | Freq: Two times a day (BID) | ORAL | Status: DC

## 2014-08-14 MED ORDER — ALPRAZOLAM 0.25 MG PO TABS: ORAL_TABLET | ORAL | Status: DC

## 2014-08-14 NOTE — Tx Team (Signed)
  Interdisciplinary Treatment Plan Update   Date Reviewed:  08/14/2014  Time Reviewed:  8:21 AM  Progress in Treatment:   Attending groups: Yes Participating in groups: Yes Taking medication as prescribed: Yes  Tolerating medication: Yes Family/Significant other contact made: Yes  Patient understands diagnosis: Yes  Discussing patient identified problems/goals with staff: Yes  See initial care plan Medical problems stabilized or resolved: Yes Denies suicidal/homicidal ideation: Yes  In tx team Patient has not harmed self or others: Yes  For review of initial/current patient goals, please see plan of care.  Estimated Length of Stay:  D/C today  Reason for Continuation of Hospitalization:   New Problems/Goals identified:  N/A  Discharge Plan or Barriers:   return home, follow up outpt  Additional Comments:  "My mood is starting to feel more stable. I am sleeping better. I think my husband missed some triggers with me. I went on a trip with my mother who set me off. I am thinking more clearly now. I am just tired from the medication and everything that has happened from being manic."  She is assessed in her room. Patient is much less irritable, speech is less pressured and thought processes are becoming goal directed. Spoke with nursing staff who feel patient is stable enough to be taken off 1:1 observation. The patient has been showing steady improvement in her symptoms as medications have been adjusted. Her Depakote level is nearly therapeutic at 47.8.    Attendees:  Signature: Steva Colder, MD 08/14/2014 8:21 AM   Signature: Ripley Fraise, LCSW 08/14/2014 8:21 AM  Signature: Elmarie Shiley, NP 08/14/2014 8:21 AM  Signature: Mayra Neer, RN 08/14/2014 8:21 AM  Signature: Darrol Angel, RN 08/14/2014 8:21 AM  Signature:  08/14/2014 8:21 AM  Signature:   08/14/2014 8:21 AM  Signature:    Signature:    Signature:    Signature:    Signature:    Signature:      Scribe for Treatment  Team:   [employer], LCSW  08/14/2014 8:21 AM

## 2014-08-14 NOTE — Discharge Summary (Signed)
Physician Discharge Summary Note  Patient:  [name] is an 38 y.o., female MRN:  [id number] DOB:  10/05/1976 Patient phone:  [phone] (home)  Patient address:   [place] [address],  Total Time spent with patient: Greater than 30 minutes  Date of Admission:  08/08/2014  Date of Discharge: 08/14/14  Reason for Admission: Mood stabilization treatment  Discharge Diagnoses: Active Problems:   Bipolar 1 disorder, manic, moderate   Diabetes mellitus type 2 in obese   Seasonal allergies   Embedded earring of left ear   Psychiatric Specialty Exam: Physical Exam  Psychiatric: Her speech is normal and behavior is normal. Judgment and thought content normal. Her mood appears not anxious. Her affect is not angry, not blunt, not labile and not inappropriate. Cognition and memory are normal. She does not exhibit a depressed mood.    Review of Systems  Constitutional: Negative.   HENT: Negative.   Eyes: Negative.   Respiratory: Negative.   Cardiovascular: Negative.   Gastrointestinal: Negative.   Genitourinary: Negative.   Musculoskeletal: Negative.   Skin: Negative.   Neurological: Negative.   Endo/Heme/Allergies: Negative.   Psychiatric/Behavioral: Negative for depression, suicidal ideas, hallucinations, memory loss and substance abuse. The patient has insomnia (Stable). The patient is not nervous/anxious.     Blood pressure 135/75, pulse 98, temperature 98.1 F (36.7 C), temperature source Oral, resp. rate 16, height 5' 3.5" (1.613 m), weight 110.224 kg (243 lb), SpO2 98.00%.Body mass index is 42.37 kg/(m^2).   General Appearance: Casual   Eye Contact:: Good   Speech: Clear and Coherent   Volume: Normal   Mood: Negative   Affect: Appropriate   Thought Process: Linear   Orientation: Full (Time, Place, and Person)   Thought Content: Rumination   Suicidal Thoughts: No   Homicidal Thoughts: No   Memory: Immediate; Negative  Recent; Good    Judgement: Fair   Insight: Fair   Psychomotor Activity: Normal   Concentration: Good   Recall: [place] of Knowledge:Fair   Language: Fair   Akathisia: No     AIMS (if indicated): 0   Assets: Communication Skills   Sleep: Number of Hours: 6.5    Past Psychiatric History: Diagnosis: Bipolar 1 disorder, manic, moderate  Hospitalizations: [facility] adult unit  Outpatient Care: With Dr. Caprice Beaver  Substance Abuse Care: NA  Self-Mutilation: NA  Suicidal Attempts: NA  Violent Behaviors: NA   Musculoskeletal: Strength & Muscle Tone: within normal limits Gait & Station: normal Patient leans: N/A  DSM5: Schizophrenia Disorders: NA Obsessive-Compulsive Disorders:  NA Trauma-Stressor Disorders:  NA Substance/Addictive Disorders:  NA Depressive Disorders:  Bipolar 1 disorder, manic, moderate  Axis Diagnosis:  AXIS I:  Bipolar 1 disorder, manic, moderate AXIS II:  Deferred AXIS III:   Past Medical History  Diagnosis Date  . Obesity   . Diabetes mellitus type 2 in obese   . Bipolar 1 disorder   . Seasonal allergies    AXIS IV:  other psychosocial or environmental problems and mental illness, chronic AXIS V:  63  Level of Care:  OP  Hospital Course:  Patient is a 38 year old Caucasian female,who presented IVCed by her husband for being manic ,hyperactive as well as disorganized. Per IVC petition patient was showing erratic behavior,walking around naked and using profanity. She was found to be yelling ,throwing property and per husband's report patient does act like this when she is really manic. Patient was seen ,patient appears to be  very manic ,with pressured speech . She is also very disorganized.  [name] was admitted to the hospital for mood stabilization treatment. She came in presenting with what appeared to be symptoms of manic episodes of bipolar disorder. She received medication management. [name] was ordered, medicated with and discharged on; Xanax 0.25 mg twice daily as  needed for anxiety, Depakote ER 500 mg twice daily for mood stabilization, Cogentin 1 mg twice daily for prevention of drug induced EPS, Olanzapine Zydis 10 mg Q bedtime for mood control and Trazodone 50 mg Q bedtime for sleep. She was also enrolled and participated in the group counseling sessions being offered and held on this unit. She learned coping skills. [name] was resumed on all her pertinent home medications for her other medical issues. She tolerated her treatment regimen without any adverse effects and or reactions.  [name]'s symptoms responded well to her treatment regimen. She was also motivated for recovery. She worked closely with the treatment team and case managers to develop a discharge plan with appropriate goals to maintain mood stability. Coping skills, problem solving as well as relaxation therapies were also part of the unit programming. On the day of discharge she was in much improved condition than upon admission.  Her symptoms were reported as significantly decreased or resolved completely. Upon discharge, she adamantly denies any SI/HI, AVH, delusional thoughts and or paranoia. She was motivated to continue taking medication with a goal of continued improvement in mental health. She will follow-up care with Dr. Letta Moynahan. [name] received all the necessary information required to make this appointment without problems. She was provided with a 4 days worth, supply samples of her [facility] discharge medications. She left [facility] in no distress. Transportation per family.   Consults:  psychiatry  Significant Diagnostic Studies:  labs: CBC with diff, CMP, UDS, toxicology tests, U/A  Discharge Vitals:   Blood pressure 135/75, pulse 98, temperature 98.1 F (36.7 C), temperature source Oral, resp. rate 16, height 5' 3.5" (1.613 m), weight 110.224 kg (243 lb), SpO2 98.00%. Body mass index is 42.37 kg/(m^2). Lab Results:   Results for orders placed during the hospital encounter of 08/08/14  (from the past 72 hour(s))  CBG MONITORING, ED     Status: Abnormal   Collection Time    08/11/14  8:30 PM      Result Value Ref Range   Glucose-Capillary 183 (*) 70 - 99 mg/dL  GLUCOSE, CAPILL[facility]     Status: Abnormal   Collection Time    08/12/14 12:06 PM      Result Value Ref Range   Glucose-Capillary 107 (*) 70 - 99 mg/dL   Comment 1 Notify RN    GLUCOSE, CAPILL[facility]     Status: Abnormal   Collection Time    08/12/14  4:55 PM      Result Value Ref Range   Glucose-Capillary 120 (*) 70 - 99 mg/dL  GLUCOSE, CAPILLARY     Status: Abnormal   Collection Time    08/12/14  9:01 PM      Result Value Ref Range   Glucose-Capillary 109 (*) 70 - 99 mg/dL   Comment 1 Notify RN    GLUCOSE, CAPILLARY     Status: None   Collection Time    08/13/14  6:03 AM      Result Value Ref Range   Glucose-Capillary 93  70 - 99 mg/dL   Comment 1 Documented in Chart    VALPROIC ACID LEVEL     Status: Abnormal  Collection Time    08/13/14  6:20 AM      Result Value Ref Range   Valproic Acid Lvl 47.8 (*) 50.0 - 100.0 ug/mL   Comment: Performed at [place], [facility]     Status: Abnormal   Collection Time    08/13/14 11:53 AM      Result Value Ref Range   Glucose-Capillary 118 (*) 70 - 99 mg/dL   Comment 1 Notify RN    GLUCOSE, CAPILLARY     Status: Abnormal   Collection Time    08/13/14  5:04 PM      Result Value Ref Range   Glucose-Capillary 121 (*) 70 - 99 mg/dL   Comment 1 Notify RN    GLUCOSE, CAPILLARY     Status: Abnormal   Collection Time    08/13/14  8:49 PM      Result Value Ref Range   Glucose-Capillary 112 (*) 70 - 99 mg/dL  GLUCOSE, CAPILLARY     Status: None   Collection Time    08/14/14  6:14 AM      Result Value Ref Range   Glucose-Capillary 81  70 - 99 mg/dL  BASIC METABOLIC PANEL     Status: Abnormal   Collection Time    08/14/14  6:23 AM      Result Value Ref Range   Sodium 137  137 - 147 mEq/L   Potassium 4.4  3.7 - 5.3 mEq/L   Chloride 98  96 -  112 mEq/L   CO2 23  19 - 32 mEq/L   Glucose, Bld 94  70 - 99 mg/dL   BUN 7  6 - 23 mg/dL   Creatinine, Ser 0.85  0.50 - 1.10 mg/dL   Calcium 9.6  8.4 - 10.5 mg/dL   GFR calc non Af Amer 87 (*) >90 mL/min   GFR calc Af Amer >90  >90 mL/min   Comment: (NOTE)     The eGFR has been calculated using the CKD EPI equation.     This calculation has not been validated in all clinical situations.     eGFR's persistently <90 mL/min signify possible Chronic Kidney     Disease.   Anion gap 16 (*) 5 - 15   Comment: Performed at [place], [facility]     Status: Abnormal   Collection Time    08/14/14 11:58 AM      Result Value Ref Range   Glucose-Capillary 103 (*) 70 - 99 mg/dL    Physical Findings: AIMS: Facial and Oral Movements Muscles of Facial Expression: None, normal Lips and Perioral Area: None, normal Jaw: None, normal Tongue: None, normal,Extremity Movements Upper (arms, wrists, hands, fingers): None, normal Lower (legs, knees, ankles, toes): None, normal, Trunk Movements Neck, shoulders, hips: None, normal, Overall Severity Severity of abnormal movements (highest score from questions above): None, normal Incapacitation due to abnormal movements: None, normal Patient's awareness of abnormal movements (rate only patient's report): No Awareness, Dental Status Current problems with teeth and/or dentures?: No Does patient usually wear dentures?: No  CIWA:  CIWA-Ar Total: 0 COWS:  COWS Total Score: 2  Psychiatric Specialty Exam: See Psychiatric Specialty Exam and Suicide Risk Assessment completed by Attending Physician prior to discharge.  Discharge destination:  Home  Is patient on multiple antipsychotic therapies at discharge:  No   Has Patient had three or more failed trials of antipsychotic monotherapy by history:  No  Recommended Plan for Multiple Antipsychotic  Therapies: NA    Medication List    STOP taking these medications        lamoTRIgine 200 MG tablet  Commonly known as:  LAMICTAL     QUEtiapine 200 MG tablet  Commonly known as:  SEROQUEL      TAKE these medications     Indication   ALPRAZolam 0.25 MG tablet  Commonly known as:  XANAX  Take 1 tablet (0.25 mg) two times daily as needed for anxiety   Indication:  Agitation, Feeling Anxious     benztropine 1 MG tablet  Commonly known as:  COGENTIN  Take 1 tablet (1 mg total) by mouth 2 (two) times daily. For prevention of drug induced involuntary movement   Indication:  Extrapyramidal Reaction caused by Medications     cephALEXin 500 MG capsule  Commonly known as:  KEFLEX  Take 1 capsule (500 mg total) by mouth 2 (two) times daily. For infection   Indication:  Infection     cetirizine 10 MG tablet  Commonly known as:  ZYRTEC  Take 1 tablet (10 mg total) by mouth every morning. For allergies   Indication:  Perennial Rhinitis, Hayfever     divalproex 500 MG 24 hr tablet  Commonly known as:  DEPAKOTE ER  Take 1 tablet (500 mg total) by mouth 2 (two) times daily. For mood stabilization   Indication:  Mood stabilization     metFORMIN 500 MG tablet  Commonly known as:  GLUCOPHAGE  Take 1 tablet (500 mg total) by mouth 2 (two) times daily with a meal. For diabetes management   Indication:  Type 2 Diabetes     mupirocin cream 2 %  Commonly known as:  BACTROBAN  Apply topically 2 (two) times daily. For wound care   Indication:  Wound care     norethindrone-ethinyl estradiol 1-20 MG-MCG tablet  Commonly known as:  JUNEL FE,GILDESS FE,LOESTRIN FE  Take 1 tablet by mouth daily. For birth control method   Indication:  Birth control method     OLANZapine zydis 10 MG disintegrating tablet  Commonly known as:  ZYPREXA  Take 1 tablet (10 mg total) by mouth 2 (two) times daily. For mood control   Indication:  Mood control     sulfamethoxazole-trimethoprim 800-160 MG per tablet  Commonly known as:  BACTRIM DS  Take 2 tablets by mouth 2 (two) times daily.  For infection   Indication:  Infection     traZODone 50 MG tablet  Commonly known as:  DESYREL  Take 1 tablet (50 mg total) by mouth at bedtime and may repeat dose one time if needed. For sleep   Indication:  Trouble Sleeping       Follow-up Information   Follow up with Sheralyn Boatman, MD On 08/15/2014. (Appt for hospital follow-up/mediction management at 9:15am on this date with Dr. Caprice Beaver.)    Contact information:   [address] [place], [place] [address] Phone: [phone] Fax: [phone]     Follow-up recommendations:  Activity:  As tolerated Diet: As recommended by your primary care doctor. Keep all scheduled follow-up appointments as recommended.  Comments: Take all your medications as prescribed by your mental healthcare provider. Report any adverse effects and or reactions from your medicines to your outpatient provider promptly. Patient is instructed and cautioned to not engage in alcohol and or illegal drug use while on prescription medicines. In the event of worsening symptoms, patient is instructed to call the crisis hotline, 911 and or go to the  nearest ED for appropriate evaluation and treatment of symptoms. Follow-up with your primary care provider for your other medical issues, concerns and or health care needs.    Total Discharge Time:  Greater than 30 minutes.  Signed: Lindell Spar I, PMHNP-BC 08/14/2014, 1:18 PM

## 2014-08-14 NOTE — Progress Notes (Signed)
Discharge Note:  Patient discharged home with husband.  Denied SI and HI.  Denied A/V hallucinations.  Suicide prevention information given and discussed with patient who stated she understood and had no questions.  Patient stated she received all her belongings, misc items, toiletries, clothing, prescriptions, medications, car keys, drivers license, shoes, clothes.  Patient stated she appreciated all assistance received from [facility] staff.

## 2014-08-14 NOTE — Progress Notes (Signed)
[facility] 1:1 Observation Documentation  For the first (8) hours following discontinuation of 1:1 precautions, a progress note entry by nursing staff should be documented at least every 2 hours, reflecting the patient's behavior, condition, mood, and conversation.  Use the progress notes for additional entries.  (Late Entry)  Time 1:1 discontinued:  11:39   Patient's Behavior:  Watching TV and socializing with peers on dayroom  Patient's Condition:  Appropriate  Patient's Conversation:  "I am so proud of myself"  Lonny Prude 08/14/2014, 1:33 AM

## 2014-08-14 NOTE — Discharge Summary (Signed)
Patient was seen face to face for psychiatric evaluation, suicide risk assessment and case discussed with treatment team and NP and made appropriate disposition plans. Reviewed the information documented and agree with the treatment plan.   Tracey Stewart ,MD Attending Psychiatrist  [facility]    

## 2014-08-14 NOTE — Progress Notes (Signed)
[facility] Adult Case Management Discharge Plan :  Will you be returning to the same living situation after discharge: Yes,  home At discharge, do you have transportation home?:Yes,  family Do you have the ability to pay for your medications:Yes,  insurance  Release of information consent forms completed and in the chart;  Patient's signature needed at discharge.  Patient to Follow up at: Follow-up Information   Follow up with Sheralyn Boatman, MD On 08/15/2014. (Appt for hospital follow-up/mediction management at 9:15am on this date with Dr. Caprice Beaver.)    Contact information:   [address] [place], [place] [address] Phone: [phone] Fax: [phone]      Patient denies SI/HI:   Yes,  yes    Safety Planning and Suicide Prevention discussed:  Yes,  yes  Trish Mage 08/14/2014, 10:16 AM

## 2014-08-14 NOTE — [facility] Suicide Risk Assessment (Signed)
   Demographic Factors:  Caucasian  Total Time spent with patient: 20 minutes  Psychiatric Specialty Exam: Physical Exam  Constitutional: She appears well-developed and well-nourished.  Eyes: Pupils are equal, round, and reactive to light.  Skin: Skin is warm.  Ear lobes healing  Psychiatric: She has a normal mood and affect. Her behavior is normal.    Review of Systems  Constitutional: Negative.   HENT: Positive for ear pain (improving).   Musculoskeletal: Negative.   Skin: Negative.   Psychiatric/Behavioral: Negative for suicidal ideas and hallucinations. The patient is not nervous/anxious and does not have insomnia.     Blood pressure 125/105, pulse 98, temperature 98.1 F (36.7 C), temperature source Oral, resp. rate 16, height 5' 3.5" (1.613 m), weight 110.224 kg (243 lb), SpO2 98.00%.Body mass index is 42.37 kg/(m^2).  General Appearance: Casual  Eye Contact::  Good  Speech:  Clear and Coherent  Volume:  Normal  Mood:  Negative  Affect:  Appropriate  Thought Process:  Linear  Orientation:  Full (Time, Place, and Person)  Thought Content:  Rumination  Suicidal Thoughts:  No  Homicidal Thoughts:  No  Memory:  Immediate;   Negative Recent;   Good  Judgement:  Fair  Insight:  Fair  Psychomotor Activity:  Normal  Concentration:  Good  Recall:  [place] of Knowledge:Fair  Language: Fair  Akathisia:  No    AIMS (if indicated):   0  Assets:  Communication Skills  Sleep:  Number of Hours: 6.5    Musculoskeletal: Strength & Muscle Tone: within normal limits Gait & Station: normal Patient leans: N/A   Mental Status Per Nursing Assessment::   On Admission:  NA (denies SI)  Current Mental Status by Physician: denies SI/HI/AH/VH  Loss Factors: NA  Historical Factors: Family history of mental illness or substance abuse and Impulsivity  Risk Reduction Factors:   Positive social support, Positive therapeutic relationship and Positive coping skills or  problem solving skills  Continued Clinical Symptoms:  Previous Psychiatric Diagnoses and Treatments  Cognitive Features That Contribute To Risk:  Closed-mindedness    Suicide Risk:  Minimal: No identifiable suicidal ideation.  Patients presenting with no risk factors but with morbid ruminations; may be classified as minimal risk based on the severity of the depressive symptoms  Discharge Diagnoses:   DSM5:  Primary psychiatric diagnosis:  Bipolar disorder,Type I , Currently in Manic phase,severe with psychotic features.    Non psychiatric diagnosis:  Prediabetes  Morbid obesity  Past Medical History  Diagnosis Date  . Obesity   . Diabetes mellitus type 2 in obese   . Bipolar 1 disorder   . Seasonal allergies     Plan Of Care/Follow-up recommendations:  Activity:  AS TOLERATED  Is patient on multiple antipsychotic therapies at discharge:  No   Has Patient had three or more failed trials of antipsychotic monotherapy by history:  No  Recommended Plan for Multiple Antipsychotic Therapies: NA    Chief Walkup 08/14/2014, 9:25 AM

## 2014-08-14 NOTE — Progress Notes (Signed)
D:  Patient's self inventory sheet, patient slept good last night, takes sleep medication which was helpful.  Improved appetite, normal energy level, good concentration.  Denied depression and hopeless.  Rated anxiety 1.  Denied withdrawals.  Denied SI.  Feels alittle "antsy" to go home.  Goal is to listen actively to social worker, case manager and MD about discharge plan and how to feel supported at home.   Plans to ask good questions, takes notes to own her own wellness plan.  Plans to discuss with MD her medications changes and how long to take her medications. A:  Medications administered per MD orders.  Emotional support and encouragement given patient. R:  Denied SI and HI, contracts for safety.   Safety maintained with 15 minute checks.

## 2014-08-17 NOTE — Progress Notes (Signed)
Patient Discharge Instructions:  After Visit Summary (AVS):   Faxed to:  08/17/14 Discharge Summary Note:   Faxed to:  08/17/14 Psychiatric Admission Assessment Note:   Faxed to:  08/17/14 Suicide Risk Assessment - Discharge Assessment:   Faxed to:  08/17/14 Faxed/Sent to the Next Level Care provider:  08/17/14 Faxed to Sheralyn Boatman MD @ [place], 08/17/2014, 3:23 PM

## 2016-01-03 ENCOUNTER — Other Ambulatory Visit (HOSPITAL_COMMUNITY)
Admission: RE | Admit: 2016-01-03 | Discharge: 2016-01-03 | Disposition: A | Discharge status: Home / Self Care | Payer: 59 | Source: Ambulatory Visit | Attending: Obstetrics and Gynecology | Admitting: Obstetrics and Gynecology

## 2016-01-03 ENCOUNTER — Other Ambulatory Visit: Payer: Self-pay | Admitting: Obstetrics and Gynecology

## 2016-01-03 DIAGNOSIS — Z1151 Encounter for screening for human papillomavirus (HPV): Secondary | ICD-10-CM | POA: Insufficient documentation

## 2016-01-03 DIAGNOSIS — Z01419 Encounter for gynecological examination (general) (routine) without abnormal findings: Secondary | ICD-10-CM | POA: Diagnosis present

## 2016-01-07 LAB — CYTOLOGY - PAP

## 2017-01-06 DIAGNOSIS — Z01411 Encounter for gynecological examination (general) (routine) with abnormal findings: Secondary | ICD-10-CM | POA: Diagnosis not present

## 2017-01-21 DIAGNOSIS — N763 Subacute and chronic vulvitis: Secondary | ICD-10-CM | POA: Diagnosis not present

## 2017-03-24 DIAGNOSIS — E119 Type 2 diabetes mellitus without complications: Secondary | ICD-10-CM | POA: Diagnosis not present

## 2017-03-24 DIAGNOSIS — Z79899 Other long term (current) drug therapy: Secondary | ICD-10-CM | POA: Diagnosis not present

## 2017-07-16 DIAGNOSIS — M25531 Pain in right wrist: Secondary | ICD-10-CM | POA: Diagnosis not present

## 2017-07-16 DIAGNOSIS — M25572 Pain in left ankle and joints of left foot: Secondary | ICD-10-CM | POA: Diagnosis not present

## 2017-07-16 DIAGNOSIS — M25472 Effusion, left ankle: Secondary | ICD-10-CM | POA: Diagnosis not present

## 2017-09-24 DIAGNOSIS — Z7984 Long term (current) use of oral hypoglycemic drugs: Secondary | ICD-10-CM | POA: Diagnosis not present

## 2017-09-24 DIAGNOSIS — E1165 Type 2 diabetes mellitus with hyperglycemia: Secondary | ICD-10-CM | POA: Diagnosis not present

## 2017-09-24 DIAGNOSIS — Z23 Encounter for immunization: Secondary | ICD-10-CM | POA: Diagnosis not present

## 2017-09-24 DIAGNOSIS — Z13220 Encounter for screening for lipoid disorders: Secondary | ICD-10-CM | POA: Diagnosis not present

## 2017-09-24 DIAGNOSIS — Z0000 Encounter for general adult medical examination without abnormal findings: Secondary | ICD-10-CM | POA: Diagnosis not present

## 2018-01-08 DIAGNOSIS — R0602 Shortness of breath: Secondary | ICD-10-CM | POA: Diagnosis not present

## 2018-01-08 DIAGNOSIS — E7800 Pure hypercholesterolemia, unspecified: Secondary | ICD-10-CM | POA: Diagnosis not present

## 2018-01-08 DIAGNOSIS — R5383 Other fatigue: Secondary | ICD-10-CM | POA: Diagnosis not present

## 2018-01-08 DIAGNOSIS — E1165 Type 2 diabetes mellitus with hyperglycemia: Secondary | ICD-10-CM | POA: Diagnosis not present

## 2018-01-08 DIAGNOSIS — Z01411 Encounter for gynecological examination (general) (routine) with abnormal findings: Secondary | ICD-10-CM | POA: Diagnosis not present

## 2018-02-08 DIAGNOSIS — E1165 Type 2 diabetes mellitus with hyperglycemia: Secondary | ICD-10-CM | POA: Diagnosis not present

## 2018-03-08 DIAGNOSIS — Z79899 Other long term (current) drug therapy: Secondary | ICD-10-CM | POA: Diagnosis not present

## 2018-04-08 DIAGNOSIS — E559 Vitamin D deficiency, unspecified: Secondary | ICD-10-CM | POA: Diagnosis not present

## 2018-04-08 DIAGNOSIS — E7800 Pure hypercholesterolemia, unspecified: Secondary | ICD-10-CM | POA: Diagnosis not present

## 2018-04-08 DIAGNOSIS — E1165 Type 2 diabetes mellitus with hyperglycemia: Secondary | ICD-10-CM | POA: Diagnosis not present

## 2018-04-08 DIAGNOSIS — Z79899 Other long term (current) drug therapy: Secondary | ICD-10-CM | POA: Diagnosis not present

## 2018-05-13 DIAGNOSIS — E559 Vitamin D deficiency, unspecified: Secondary | ICD-10-CM | POA: Diagnosis not present

## 2018-05-13 DIAGNOSIS — E1165 Type 2 diabetes mellitus with hyperglycemia: Secondary | ICD-10-CM | POA: Diagnosis not present

## 2018-05-31 DIAGNOSIS — I10 Essential (primary) hypertension: Secondary | ICD-10-CM | POA: Diagnosis not present

## 2018-06-11 DIAGNOSIS — E119 Type 2 diabetes mellitus without complications: Secondary | ICD-10-CM | POA: Diagnosis not present

## 2018-06-11 DIAGNOSIS — E871 Hypo-osmolality and hyponatremia: Secondary | ICD-10-CM | POA: Diagnosis not present

## 2019-01-10 ENCOUNTER — Other Ambulatory Visit: Payer: Self-pay | Admitting: Obstetrics and Gynecology

## 2019-01-10 ENCOUNTER — Other Ambulatory Visit (HOSPITAL_COMMUNITY)
Admission: RE | Admit: 2019-01-10 | Discharge: 2019-01-10 | Disposition: A | Discharge status: Home / Self Care | Payer: 59 | Source: Ambulatory Visit | Attending: Obstetrics and Gynecology | Admitting: Obstetrics and Gynecology

## 2019-01-10 DIAGNOSIS — Z01419 Encounter for gynecological examination (general) (routine) without abnormal findings: Secondary | ICD-10-CM | POA: Diagnosis not present

## 2019-01-10 DIAGNOSIS — Z01411 Encounter for gynecological examination (general) (routine) with abnormal findings: Secondary | ICD-10-CM | POA: Diagnosis not present

## 2019-01-10 DIAGNOSIS — Z1231 Encounter for screening mammogram for malignant neoplasm of breast: Secondary | ICD-10-CM

## 2019-01-10 DIAGNOSIS — N763 Subacute and chronic vulvitis: Secondary | ICD-10-CM | POA: Diagnosis not present

## 2019-01-12 LAB — CYTOLOGY - PAP
Diagnosis: NEGATIVE
HPV (WINDOPATH): NOT DETECTED

## 2019-02-07 ENCOUNTER — Ambulatory Visit
Admission: RE | Admit: 2019-02-07 | Discharge: 2019-02-07 | Disposition: A | Discharge status: Home / Self Care | Payer: 59 | Source: Ambulatory Visit | Attending: Obstetrics and Gynecology | Admitting: Obstetrics and Gynecology

## 2019-02-07 DIAGNOSIS — Z1231 Encounter for screening mammogram for malignant neoplasm of breast: Secondary | ICD-10-CM

## 2019-02-08 ENCOUNTER — Other Ambulatory Visit: Payer: Self-pay | Admitting: Obstetrics and Gynecology

## 2019-02-08 DIAGNOSIS — R928 Other abnormal and inconclusive findings on diagnostic imaging of breast: Secondary | ICD-10-CM

## 2019-02-16 ENCOUNTER — Other Ambulatory Visit: Payer: 59

## 2019-02-23 ENCOUNTER — Other Ambulatory Visit: Payer: Self-pay | Admitting: Obstetrics and Gynecology

## 2019-02-23 ENCOUNTER — Other Ambulatory Visit: Payer: Self-pay

## 2019-02-23 ENCOUNTER — Ambulatory Visit
Admission: RE | Admit: 2019-02-23 | Discharge: 2019-02-23 | Disposition: A | Discharge status: Home / Self Care | Payer: 59 | Source: Ambulatory Visit | Attending: Obstetrics and Gynecology | Admitting: Obstetrics and Gynecology

## 2019-02-23 DIAGNOSIS — N6311 Unspecified lump in the right breast, upper outer quadrant: Secondary | ICD-10-CM | POA: Diagnosis not present

## 2019-02-23 DIAGNOSIS — N6313 Unspecified lump in the right breast, lower outer quadrant: Secondary | ICD-10-CM | POA: Diagnosis not present

## 2019-02-23 DIAGNOSIS — R928 Other abnormal and inconclusive findings on diagnostic imaging of breast: Secondary | ICD-10-CM | POA: Diagnosis not present

## 2019-02-23 DIAGNOSIS — N63 Unspecified lump in unspecified breast: Secondary | ICD-10-CM

## 2019-08-29 ENCOUNTER — Other Ambulatory Visit: Payer: Self-pay | Admitting: Obstetrics and Gynecology

## 2019-08-29 ENCOUNTER — Other Ambulatory Visit: Payer: Self-pay

## 2019-08-29 ENCOUNTER — Ambulatory Visit
Admission: RE | Admit: 2019-08-29 | Discharge: 2019-08-29 | Disposition: A | Discharge status: Home / Self Care | Payer: 59 | Source: Ambulatory Visit | Attending: Obstetrics and Gynecology | Admitting: Obstetrics and Gynecology

## 2019-08-29 DIAGNOSIS — N63 Unspecified lump in unspecified breast: Secondary | ICD-10-CM

## 2019-09-13 ENCOUNTER — Other Ambulatory Visit: Payer: Self-pay | Admitting: Obstetrics and Gynecology

## 2020-02-27 ENCOUNTER — Ambulatory Visit
Admission: RE | Admit: 2020-02-27 | Discharge: 2020-02-27 | Disposition: A | Discharge status: Home / Self Care | Payer: 59 | Source: Ambulatory Visit | Attending: Obstetrics and Gynecology | Admitting: Obstetrics and Gynecology

## 2020-02-27 ENCOUNTER — Other Ambulatory Visit: Payer: Self-pay

## 2020-02-27 ENCOUNTER — Other Ambulatory Visit: Payer: Self-pay | Admitting: Obstetrics and Gynecology

## 2020-02-27 DIAGNOSIS — N63 Unspecified lump in unspecified breast: Secondary | ICD-10-CM

## 2020-02-27 DIAGNOSIS — N6489 Other specified disorders of breast: Secondary | ICD-10-CM

## 2020-03-01 ENCOUNTER — Ambulatory Visit
Admission: RE | Admit: 2020-03-01 | Discharge: 2020-03-01 | Disposition: A | Discharge status: Home / Self Care | Payer: 59 | Source: Ambulatory Visit | Attending: Obstetrics and Gynecology | Admitting: Obstetrics and Gynecology

## 2020-03-01 ENCOUNTER — Encounter: Payer: Self-pay | Admitting: Oncology

## 2020-03-01 ENCOUNTER — Other Ambulatory Visit: Payer: Self-pay

## 2020-03-01 DIAGNOSIS — N6489 Other specified disorders of breast: Secondary | ICD-10-CM

## 2020-03-02 ENCOUNTER — Encounter: Payer: Self-pay | Admitting: *Deleted

## 2020-03-02 ENCOUNTER — Other Ambulatory Visit: Payer: Self-pay | Admitting: Obstetrics and Gynecology

## 2020-03-02 DIAGNOSIS — N63 Unspecified lump in unspecified breast: Secondary | ICD-10-CM

## 2020-03-05 ENCOUNTER — Ambulatory Visit
Admission: RE | Admit: 2020-03-05 | Discharge: 2020-03-05 | Disposition: A | Discharge status: Home / Self Care | Payer: 59 | Source: Ambulatory Visit | Attending: Obstetrics and Gynecology | Admitting: Obstetrics and Gynecology

## 2020-03-05 ENCOUNTER — Other Ambulatory Visit: Payer: Self-pay | Admitting: *Deleted

## 2020-03-05 ENCOUNTER — Other Ambulatory Visit: Payer: Self-pay

## 2020-03-05 DIAGNOSIS — Z170 Estrogen receptor positive status [ER+]: Secondary | ICD-10-CM | POA: Insufficient documentation

## 2020-03-05 DIAGNOSIS — N63 Unspecified lump in unspecified breast: Secondary | ICD-10-CM

## 2020-03-05 DIAGNOSIS — C50211 Malignant neoplasm of upper-inner quadrant of right female breast: Secondary | ICD-10-CM | POA: Insufficient documentation

## 2020-03-06 NOTE — Progress Notes (Signed)
[place]  Telephone:([phone]) [phone] Fax:(336) [phone]     ID: [name] DOB: [birth year]  MR#: [id number]  ZJI#:[id number]  Patient Care Team: Precious Gilding, Romeo as PCP - General (Physician Assistant) System, Provider Not In Thurnell Lose, MD as Consulting Physician (Obstetrics and Gynecology) Mauro Kaufmann, RN as Oncology Nurse Navigator Rockwell Germany, RN as Oncology Nurse Navigator [name], MD as Consulting Physician (General Surgery) Abri Vacca, Virgie Dad, MD as Consulting Physician (Oncology) Kyung Rudd, MD as Consulting Physician (Radiation Oncology) Chauncey Cruel, MD OTHER MD: Pauline Good, NP (psychiatry)  CHIEF COMPLAINT: estrogen receptor positive breast cancer  CURRENT TREATMENT: Awaiting definitive surgery   HISTORY OF CURRENT ILLNESS:  "[name]" had routine screening mammography on 02/07/2019 showing a possible abnormality in the right breast. She underwent right diagnostic mammography with tomography and right breast ultrasonography at [facility] on 02/23/2019 showing: breast density category B; probably-benign 0.7 cm mass in right breast at 9 o'clock, likely a fibroadenoma. She returned for short-term right breast ultrasound on 08/29/2019, which showed stability of the probably-benign mass.  She returned for her annual mammography and underwent bilateral diagnostic mammography with tomography and right breast ultrasonography at [facility] on 02/27/2020 showing: breast density category B; new area of distortion in medial inferior right breast; this could not be demonstrated on ultrasound.  There was a stable probably-benign right breast mass at 9 o'clock; no evidence of left breast malignancy or lymphadenopathy.  Accordingly on 03/01/2020 she proceeded to biopsy of the right breast area in question. The pathology from this procedure ([id number]) showed: invasive ductal carcinoma, grade 1; adenosis with calcifications.  Prognostic indicators significant for: estrogen receptor, 95% positive with moderate staining intensity and progesterone receptor, 95% positive with strong staining intensity. Proliferation marker Ki67 at 2%. HER2 negative by immunohistochemistry (1+).  Biopsy of the probably-benign mass was performed on 03/05/2020. 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  The patient's subsequent history is as detailed below.   INTERVAL HISTORY: [name] was evaluated in the multidisciplinary breast cancer clinic on 03/07/2020 accompanied by her husband Ulice Dash. Her case was also presented at the multidisciplinary breast cancer conference on the same day. At that time a preliminary plan was proposed: MammaPrint to be obtained from the original biopsy, breast conserving surgery, adjuvant radiation, genetics, antiestrogens  During the visit labs obtained the same morning were resulted and showed a glucose level of 637.  The patient tells [place] that she has been not taking any treatment for her diabetes for about a year and that this morning she had a waffle with extra syrup for breakfast.  REVIEW OF SYSTEMS: On the provided questionnaire, [name] reports wearing glasses, mild breast pain, joint pain, anxiety, diabetes, and hot flashes. The patient denies unusual headaches, visual changes, nausea, vomiting, stiff neck, dizziness, or gait imbalance. There has been no cough, phlegm production, or pleurisy, no chest pain or pressure, and no change in bowel or bladder habits. The patient denies fever, rash, bleeding, unexplained fatigue or unexplained weight loss.  She does not exercise regularly but has a physically demanding job.  A detailed review of systems was otherwise entirely negative.   PAST MEDICAL HISTORY: Past Medical History:  Diagnosis Date  . Bipolar 1 disorder ([place])   . Diabetes mellitus type 2 in obese ([place])   . Family history of prostate cancer   . Obesity   . Seasonal allergies     PAST  SURGICAL HISTORY: Past Surgical History:  Procedure  Laterality Date  . OOPHORECTOMY  2009    FAMILY HISTORY: Family History  Problem Relation Age of Onset  . Heart disease Other   . Macular degeneration Other   . Mental illness Other        both sides of family  . Prostate cancer Paternal Uncle   Her father is age 72 and her mother 23 as of 02/2020.  The patient has 3 brothers and 1 sister. She reports prostate cancer in a paternal uncle at age 107.  There is a family history of ovarian cancer.  She denies a family history of breast or pancreatic cancer.   GYNECOLOGIC HISTORY:  No LMP recorded. (Menstrual status: Other). Menarche: 44 years old [place] P 0 LMP 02/2020, regular and lasts 5-7 days Contraceptive: used since 2009, will switch to IUD with cancer diagnosis HRT n/a  Hysterectomy? no BSO? 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 Husband Amy Rinks "Ulice Dash" is a Administrator.  At home is just the 2 of them plus a cast.  They recently moved to a townhouse with a lot of stairs and that is helping her exercise.  She is not a Designer, fashion/clothing.    ADVANCED DIRECTIVES: In the absence of any documentation to the contrary, the patient's spouse is their HCPOA.    HEALTH MAINTENANCE: Social History   Tobacco Use  . Smoking status: Former Research scientist (life sciences)  . Smokeless tobacco: Never Used  Substance Use Topics  . Alcohol use: Yes    Comment: social  . Drug use: No     Colonoscopy: n/a (age)  PAP: 12/2018, negative  Bone density: n/a (age)   No Known Allergies  Current Outpatient Medications  Medication Sig Dispense Refill  . ALPRAZolam (XANAX) 0.25 MG tablet Take 1 tablet (0.25 mg) two times daily as needed for anxiety 30 tablet 0  . benztropine (COGENTIN) 1 MG tablet Take 1 tablet (1 mg total) by mouth 2 (two) times daily. For prevention of drug  induced involuntary movement 60 tablet 0  . divalproex (DEPAKOTE ER) 500 MG 24 hr tablet Take 1 tablet (500 mg total) by mouth 2 (two) times daily. For mood stabilization 60 tablet 0  . norethindrone-ethinyl estradiol (JUNEL FE,GILDESS FE,LOESTRIN FE) 1-20 MG-MCG tablet Take 1 tablet by mouth daily. For birth control method 1 Package 11  . OLANZapine zydis (ZYPREXA) 10 MG disintegrating tablet Take 1 tablet (10 mg total) by mouth 2 (two) times daily. For mood control 60 tablet 0  . trihexyphenidyl (ARTANE) 5 MG tablet     . metFORMIN (GLUCOPHAGE) 500 MG tablet Take 1 tablet (500 mg total) by mouth 2 (two) times daily with a meal. For diabetes management 60 tablet 3  . mupirocin cream (BACTROBAN) 2 % Apply topically 2 (two) times daily. For wound care (Patient not taking: Reported on 03/07/2020) 15 g 0  . traZODone (DESYREL) 50 MG tablet Take 1 tablet (50 mg total) by mouth at bedtime and may repeat dose one time if needed. For sleep (Patient not taking: Reported on 03/07/2020) 60 tablet 0   No current facility-administered medications for this visit.    OBJECTIVE: Morbidly obese white woman in no acute distress  Vitals:   03/07/20 0831  BP: (!) 164/98  Pulse: 99  Resp: 18  Temp: 98 F (36.7 C)  SpO2: 100%     Body mass index is  43.73 kg/m.   Wt Readings from Last 3 Encounters:  03/07/20 250 lb 12.8 oz (113.8 kg)      ECOG FS:0 - Asymptomatic  Ocular: Sclerae unicteric, pupils round and equal Ear-nose-throat: Wearing a mask Lymphatic: No cervical or supraclavicular adenopathy Lungs no rales or rhonchi Heart regular rate and rhythm Abd soft, obese, nontender, positive bowel sounds MSK no focal spinal tenderness, no joint edema Neuro: non-focal, well-oriented, appropriate affect Breasts: The right breast is status post recent biopsy.  There is no palpable mass.  There is moderate ecchymosis.  Left breast is benign.  Both axillae are benign.   LAB RESULTS:  CMP     Component  Value Date/Time   NA 133 (L) 03/07/2020 0810   K 4.2 03/07/2020 0810   CL 94 (L) 03/07/2020 0810   CO2 27 03/07/2020 0810   GLUCOSE 637 (HH) 03/07/2020 0810   BUN 11 03/07/2020 0810   CREATININE 0.80 03/07/2020 0810   CALCIUM 9.2 03/07/2020 0810   PROT 7.3 03/07/2020 0810   ALBUMIN 3.5 03/07/2020 0810   AST 14 (L) 03/07/2020 0810   ALT 12 03/07/2020 0810   ALKPHOS 73 03/07/2020 0810   BILITOT 0.3 03/07/2020 0810   GFRNONAA >60 03/07/2020 0810   GFRAA >60 03/07/2020 0810    No results found for: TOTALPROTELP, ALBUMINELP, A1GS, A2GS, BETS, BETA2SER, GAMS, MSPIKE, SPEI  Lab Results  Component Value Date   WBC 7.0 03/07/2020   NEUTROABS 4.5 03/07/2020   HGB 14.4 03/07/2020   HCT 43.0 03/07/2020   MCV 85.3 03/07/2020   PLT 330 03/07/2020    No results found for: LABCA2  No components found for: RCBULA453  No results for input(s): INR in the last 168 hours.  No results found for: LABCA2  No results found for: MIW803  No results found for: OZY248  No results found for: GNO037  No results found for: CA2729  No components found for: HGQUANT  No results found for: CEA1 / No results found for: CEA1   No results found for: AFPTUMOR  No results found for: CHROMOGRNA  No results found for: KPAFRELGTCHN, LAMBDASER, KAPLAMBRATIO (kappa/lambda light chains)  No results found for: HGBA, HGBA2QUANT, HGBFQUANT, HGBSQUAN (Hemoglobinopathy evaluation)   No results found for: LDH  No results found for: IRON, TIBC, IRONPCTSAT (Iron and TIBC)  No results found for: FERRITIN  Urinalysis    Component Value Date/Time   COLORURINE YELLOW 08/11/2014 0654   APPEARANCEUR CLOUDY (A) 08/11/2014 0654   LABSPEC 1.004 (L) 08/11/2014 0654   PHURINE 7.0 08/11/2014 0654   GLUCOSEU NEGATIVE 08/11/2014 0654   HGBUR NEGATIVE 08/11/2014 0654   BILIRUBINUR NEGATIVE 08/11/2014 0654   KETONESUR NEGATIVE 08/11/2014 0654   PROTEINUR NEGATIVE 08/11/2014 0654   UROBILINOGEN 0.2  08/11/2014 0654   NITRITE NEGATIVE 08/11/2014 0654   LEUKOCYTESUR NEGATIVE 08/11/2014 0654     STUDIES: US BREAST LTD UNI RIGHT INC AXILLA  Addendum Date: 03/02/2020   ADDENDUM REPORT: 03/02/2020 12:30 ADDENDUM: The right axillary nodes were normal in appearance. Electronically Signed   By: Dorise Bullion III M.D   On: 03/02/2020 12:30   Result Date: 03/02/2020 CLINICAL DATA:  Follow-up of a right breast mass. EXAM: DIGITAL DIAGNOSTIC BILATERAL MAMMOGRAM WITH CAD AND TOMO ULTRASOUND RIGHT BREAST COMPARISON:  Previous exam(s). ACR Breast Density Category b: There are scattered areas of fibroglandular density. FINDINGS: The mass in the lateral anterior right breast is stable mammographically. There is new distortion noted in the medial inferior right breast. No other  suspicious findings identified in either breast. Mammographic images were processed with CAD. On physical exam, no suspicious lumps are identified. Targeted ultrasound is performed, showing the mass at 9 o'clock, 2 cm from the nipple remains measuring 7 x 4 x 6 mm today versus 7 x 5 x 7 mm in March of 2020. No abnormalities are seen in the medial inferior right breast to correlate with the distortion. IMPRESSION: New distortion in the medial inferior right breast. Stable probably benign right breast mass at 9 o'clock, 2 cm from the nipple. No evidence of malignancy in the left breast. RECOMMENDATION: Stereotactic biopsy of the new medial right breast distortion. If the biopsy demonstrates malignancy, recommend biopsy of the 9 o'clock right breast mass. If the biopsy does not demonstrate malignancy, recommend a follow-up of the 9 o'clock right breast mass in 1 year to ensure at least 2 years of stability. I have discussed the findings and recommendations with the patient. If applicable, a reminder letter will be sent to the patient regarding the next appointment. BI-RADS CATEGORY  4: Suspicious. Electronically Signed: By: Dorise Bullion III  M.D On: 02/27/2020 09:13   MM DIAG BREAST TOMO BILATERAL  Addendum Date: 03/02/2020   ADDENDUM REPORT: 03/02/2020 12:30 ADDENDUM: The right axillary nodes were normal in appearance. Electronically Signed   By: Dorise Bullion III M.D   On: 03/02/2020 12:30   Result Date: 03/02/2020 CLINICAL DATA:  Follow-up of a right breast mass. EXAM: DIGITAL DIAGNOSTIC BILATERAL MAMMOGRAM WITH CAD AND TOMO ULTRASOUND RIGHT BREAST COMPARISON:  Previous exam(s). ACR Breast Density Category b: There are scattered areas of fibroglandular density. FINDINGS: The mass in the lateral anterior right breast is stable mammographically. There is new distortion noted in the medial inferior right breast. No other suspicious findings identified in either breast. Mammographic images were processed with CAD. On physical exam, no suspicious lumps are identified. Targeted ultrasound is performed, showing the mass at 9 o'clock, 2 cm from the nipple remains measuring 7 x 4 x 6 mm today versus 7 x 5 x 7 mm in March of 2020. No abnormalities are seen in the medial inferior right breast to correlate with the distortion. IMPRESSION: New distortion in the medial inferior right breast. Stable probably benign right breast mass at 9 o'clock, 2 cm from the nipple. No evidence of malignancy in the left breast. RECOMMENDATION: Stereotactic biopsy of the new medial right breast distortion. If the biopsy demonstrates malignancy, recommend biopsy of the 9 o'clock right breast mass. If the biopsy does not demonstrate malignancy, recommend a follow-up of the 9 o'clock right breast mass in 1 year to ensure at least 2 years of stability. I have discussed the findings and recommendations with the patient. If applicable, a reminder letter will be sent to the patient regarding the next appointment. BI-RADS CATEGORY  4: Suspicious. Electronically Signed: By: Dorise Bullion III M.D On: 02/27/2020 09:13   MM CLIP PLACEMENT RIGHT  Result Date: 03/05/2020 CLINICAL  DATA:  Ultrasound-guided biopsy was performed of a mass in the 9 o'clock axis of the right breast 2 cm from the nipple. Mass measures 7 mm. EXAM: DIAGNOSTIC RIGHT MAMMOGRAM POST ULTRASOUND BIOPSY COMPARISON:  Previous exam(s). FINDINGS: Mammographic images were obtained following ultrasound guided biopsy of the right breast. The biopsy marking clip is in expected position at the site of biopsy. IMPRESSION: Appropriate positioning of the ribbon shaped biopsy marking clip at the site of biopsy in the lower outer quadrant. Final Assessment: Post Procedure Mammograms for Marker  Placement Electronically Signed   By: Curlene Dolphin M.D.   On: 03/05/2020 10:06   MM CLIP PLACEMENT RIGHT  Result Date: 03/01/2020 CLINICAL DATA:  Patient status post stereotactic guided biopsy right breast distortion. EXAM: DIAGNOSTIC RIGHT MAMMOGRAM POST STEREOTACTIC BIOPSY COMPARISON:  Previous exam(s). FINDINGS: Mammographic images were obtained following stereotactic guided biopsy of right breast distortion. The biopsy marking clip is in expected position at the site of biopsy. IMPRESSION: Appropriate positioning of the X shaped biopsy marking clip at the site of biopsy in the right breast distortion. Final Assessment: Post Procedure Mammograms for Marker Placement Electronically Signed   By: Lovey Newcomer M.D.   On: 03/01/2020 13:02   MM RT BREAST BX W LOC DEV 1ST LESION IMAGE BX SPEC STEREO GUIDE  Addendum Date: 03/02/2020   ADDENDUM REPORT: 03/02/2020 14:46 ADDENDUM: Pathology revealed GRADE I INVASIVE DUCTAL CARCINOMA, ADENOSIS WITH CALCIFICATIONS of the RIGHT breast, upper inner. This was found to be concordant by Dr. Lovey Newcomer. Pathology results were discussed with the patient by telephone. The patient reported doing well after the biopsy with tenderness at the site. Post biopsy instructions and care were reviewed and questions were answered. The patient was encouraged to call [facility] for any  additional concerns. The patient is scheduled for ultrasound guided biopsy of the 9 o'clock RIGHT breast mass on March 05, 2020. The patient was referred to [facility] on March 07, 2020. Pathology results reported by Stacie Acres RN on 03/02/2020. Electronically Signed   By: Lovey Newcomer M.D.   On: 03/02/2020 14:46   Result Date: 03/02/2020 CLINICAL DATA:  Patient with right breast distortion. EXAM: RIGHT BREAST STEREOTACTIC CORE NEEDLE BIOPSY COMPARISON:  Previous exams. FINDINGS: The patient and I discussed the procedure of stereotactic-guided biopsy including benefits and alternatives. We discussed the high likelihood of a successful procedure. We discussed the risks of the procedure including infection, bleeding, tissue injury, clip migration, and inadequate sampling. Informed written consent was given. The usual time out protocol was performed immediately prior to the procedure. Using sterile technique and 1% Lidocaine as local anesthetic, under stereotactic guidance, a 9 gauge vacuum assisted device was used to perform core needle biopsy of distortion within the medial right breast using a medial approach. Lesion quadrant: Upper inner quadrant At the conclusion of the procedure, X shaped tissue marker clip was deployed into the biopsy cavity. Follow-up 2-view mammogram was performed and dictated separately. IMPRESSION: Stereotactic-guided biopsy of right breast distortion. No apparent complications. Electronically Signed: By: Lovey Newcomer M.D. On: 03/01/2020 13:01   [place] RT BREAST BX W LOC DEV 1ST LESION IMG BX SPEC US GUIDE  Addendum Date: 03/06/2020   ADDENDUM REPORT: 03/06/2020 14:19 ADDENDUM: Pathology revealed FIBROADENOMA of the RIGHT breast, 9 o'clock. This was found to be concordant by Dr. Curlene Dolphin. Pathology results were discussed with the patient by telephone. The patient reported doing well after the biopsy with  tenderness at the site. Post biopsy instructions and care were reviewed and questions were answered. The patient was encouraged to call [facility] for any additional concerns. The patient has a recent diagnosis of right breast cancer and should follow her outlined treatment plan. Pathology results reported by Stacie Acres RN on 03/06/2020. Electronically Signed   By: Curlene Dolphin M.D.   On: 03/06/2020 14:19   Result Date: 03/06/2020 CLINICAL DATA:  Ultrasound-guided core needle biopsy was recommended  of a mass in the 9 o'clock axis of the right breast 2 cm from nipple. Patient was recently diagnosed with grade 1 invasive ductal carcinoma following biopsy right breast distortion upper inner quadrant. EXAM: ULTRASOUND GUIDED RIGHT BREAST CORE NEEDLE BIOPSY COMPARISON:  Previous exam(s). PROCEDURE: I met with the patient and we discussed the procedure of ultrasound-guided biopsy, including benefits and alternatives. We discussed the high likelihood of a successful procedure. We discussed the risks of the procedure, including infection, bleeding, tissue injury, clip migration, and inadequate sampling. Informed written consent was given. The usual time-out protocol was performed immediately prior to the procedure. Lesion quadrant: Lower outer quadrant Using sterile technique and 1% Lidocaine as local anesthetic, under direct ultrasound visualization, a 12 gauge spring-loaded device was used to perform biopsy of a 7 mm mass in the 9 o'clock axis of the right breast using a lateral approach. At the conclusion of the procedure ribbon tissue marker clip was deployed into the biopsy cavity. Follow up 2 view mammogram was performed and dictated separately. IMPRESSION: Ultrasound guided biopsy of the right breast. No apparent complications. Electronically Signed: By: Curlene Dolphin M.D. On: 03/05/2020 10:05     ELIGIBLE FOR AVAILABLE RESEARCH PROTOCOL: AET  ASSESSMENT: 44 y.o. [place] woman  status post right breast upper inner quadrant biopsy 03/01/2020 for a clinical TXN0, stage Ia invasive ductal carcinoma, grade 1, estrogen and progesterone receptor positive, HER-2 not amplified, with an MIB-1 of 2%.  (1) genetics testing pending  (2) MammaPrint to be obtained from the original breast biopsy: Chemotherapy not anticipated  (3) definitive surgery to follow  (4) adjuvant radiation  (5) antiestrogens  PLAN: I met today with [name] to review her new diagnosis. Specifically we discussed the biology of her breast cancer, its diagnosis, staging, treatment  options and prognosis. We first reviewed the fact that cancer is not one disease but more than 100 different diseases and that it is important to keep them separate-- otherwise when friends and relatives discuss their own cancer experiences with Lexconfusion can result. Similarly we explained that if breast cancer spreads to the bone or liver, the patient would not have bone cancer or liver cancer, but breast cancer in the bone and breast cancer in the liver: one cancer in three places-- not 3 different cancers which otherwise would have to be treated in 3 different ways.  We discussed the difference between local and systemic therapy. In terms of loco-regional treatment, lumpectomy plus radiation is equivalent to mastectomy as far as survival is concerned. For this reason, and because the cosmetic results are generally superior, we recommend breast conserving surgery.  We then discussed the rationale for systemic therapy. There is some risk that this cancer may have already spread to other parts of her body. Patients frequently ask at this point about bone scans, CAT scans and PET scans to find out if they have occult breast cancer somewhere else. The problem is that in early stage disease we are much more likely to find false positives then true cancers and this would expose the patient to unnecessary procedures as well as unnecessary  radiation. Scans cannot answer the question the patient really would like to know, which is whether she has microscopic disease elsewhere in her body. For those reasons we do not recommend them.  Of course we would proceed to aggressive evaluation of any symptoms that might suggest metastatic disease, but that is not the case here.  Next we went over the options for systemic therapy  which are anti-estrogens, anti-HER-2 immunotherapy, and chemotherapy. [name] does not meet criteria for anti-HER-2 immunotherapy. She is a good candidate for anti-estrogens.  The question of chemotherapy is more complicated. Chemotherapy is most effective in rapidly growing, aggressive tumors. It is much less effective in low-grade, slow growing cancers, like Ken[place] 's. For that reason we are going to request a Mammaprint study from the original biopsy, as suggested by NCCN guidelines. That will help [place] make a definitive decision regarding chemotherapy in this case.  We also discussed her diabetic noncompliance.  A glucose of greater than 400 is considered an emergency and we discussed referral to the emergency room.  She also was offered intramuscular insulin but she tells me she has never had insulin and there is a concern regarding possible reactions.  In the end she agreed to resume Metformin and I have put in the prescription for her to pick that up at her pharmacy after leaving here today.  Further follow-up regarding her diabetes will be with her primary care physician.  Our navigator have called their office.  We also recommended that [name] stop her oral contraceptives.  In fact she is finishing a cycle tomorrow.  She will see Dr. Felicita Gage later this week and I have suggested the patient consider a Mirena IUD as she will almost certainly start tamoxifen right after her surgery.  Adena has a good understanding of the overall plan. She agrees with it. She knows the goal of treatment in her case is cure. She will call with  any problems that may develop before her next visit here.  Total encounter time 65 minutes.Sarajane Jews C. , MD 03/07/2020 1:16 PM Medical Oncology and Hematology [facility] [place], [place] [address] Tel. [phone]    Fax. [phone]   This document serves as a record of services personally performed by Lurline Del, MD. It was created on his behalf by Wilburn Mylar, a trained medical scribe. The creation of this record is based on the scribe's personal observations and the provider's statements to them.   I, Lurline Del MD, have reviewed the above documentation for accuracy and completeness, and I agree with the above.    *Total Encounter Time as defined by the Centers for Medicare and Medicaid Services includes, in addition to the face-to-face time of a patient visit (documented in the note above) non-face-to-face time: obtaining and reviewing outside history, ordering and reviewing medications, tests or procedures, care coordination (communications with other health care professionals or caregivers) and documentation in the medical record.

## 2020-03-07 ENCOUNTER — Ambulatory Visit (HOSPITAL_BASED_OUTPATIENT_CLINIC_OR_DEPARTMENT_OTHER): Payer: 59 | Admitting: Genetic Counselor

## 2020-03-07 ENCOUNTER — Other Ambulatory Visit: Payer: Self-pay | Admitting: Surgery

## 2020-03-07 ENCOUNTER — Encounter: Payer: Self-pay | Admitting: Oncology

## 2020-03-07 ENCOUNTER — Other Ambulatory Visit: Payer: Self-pay

## 2020-03-07 ENCOUNTER — Inpatient Hospital Stay: Payer: 59

## 2020-03-07 ENCOUNTER — Encounter: Payer: Self-pay | Admitting: Genetic Counselor

## 2020-03-07 ENCOUNTER — Inpatient Hospital Stay: Payer: 59 | Attending: Oncology | Admitting: Oncology

## 2020-03-07 ENCOUNTER — Other Ambulatory Visit: Payer: Self-pay | Admitting: *Deleted

## 2020-03-07 ENCOUNTER — Telehealth: Payer: Self-pay | Admitting: *Deleted

## 2020-03-07 ENCOUNTER — Ambulatory Visit: Payer: 59 | Attending: Surgery | Admitting: Physical Therapy

## 2020-03-07 ENCOUNTER — Encounter: Payer: Self-pay | Admitting: Physical Therapy

## 2020-03-07 ENCOUNTER — Ambulatory Visit
Admission: RE | Admit: 2020-03-07 | Discharge: 2020-03-07 | Disposition: A | Discharge status: Home / Self Care | Payer: 59 | Source: Ambulatory Visit | Attending: Radiation Oncology | Admitting: Radiation Oncology

## 2020-03-07 VITALS — BP 164/98 | HR 99 | Temp 98.0°F | Resp 18 | Ht 63.5 in | Wt 250.8 lb

## 2020-03-07 DIAGNOSIS — Z170 Estrogen receptor positive status [ER+]: Secondary | ICD-10-CM | POA: Diagnosis present

## 2020-03-07 DIAGNOSIS — R293 Abnormal posture: Secondary | ICD-10-CM | POA: Insufficient documentation

## 2020-03-07 DIAGNOSIS — C50311 Malignant neoplasm of lower-inner quadrant of right female breast: Secondary | ICD-10-CM | POA: Diagnosis not present

## 2020-03-07 DIAGNOSIS — E119 Type 2 diabetes mellitus without complications: Secondary | ICD-10-CM

## 2020-03-07 DIAGNOSIS — E11649 Type 2 diabetes mellitus with hypoglycemia without coma: Secondary | ICD-10-CM

## 2020-03-07 DIAGNOSIS — C50211 Malignant neoplasm of upper-inner quadrant of right female breast: Secondary | ICD-10-CM | POA: Diagnosis present

## 2020-03-07 DIAGNOSIS — Z8042 Family history of malignant neoplasm of prostate: Secondary | ICD-10-CM | POA: Insufficient documentation

## 2020-03-07 DIAGNOSIS — Z6841 Body Mass Index (BMI) 40.0 and over, adult: Secondary | ICD-10-CM | POA: Insufficient documentation

## 2020-03-07 DIAGNOSIS — E1165 Type 2 diabetes mellitus with hyperglycemia: Secondary | ICD-10-CM | POA: Insufficient documentation

## 2020-03-07 DIAGNOSIS — E1169 Type 2 diabetes mellitus with other specified complication: Secondary | ICD-10-CM

## 2020-03-07 DIAGNOSIS — Z853 Personal history of malignant neoplasm of breast: Secondary | ICD-10-CM

## 2020-03-07 DIAGNOSIS — D241 Benign neoplasm of right breast: Secondary | ICD-10-CM

## 2020-03-07 DIAGNOSIS — IMO0002 Reserved for concepts with insufficient information to code with codable children: Secondary | ICD-10-CM | POA: Insufficient documentation

## 2020-03-07 LAB — CBC WITH DIFFERENTIAL (CANCER CENTER ONLY)
Abs Immature Granulocytes: 0.02 10*3/uL (ref 0.00–0.07)
Basophils Absolute: 0 10*3/uL (ref 0.0–0.1)
Basophils Relative: 0 %
Eosinophils Absolute: 0.1 10*3/uL (ref 0.0–0.5)
Eosinophils Relative: 1 %
HCT: 43 % (ref 36.0–46.0)
Hemoglobin: 14.4 g/dL (ref 12.0–15.0)
Immature Granulocytes: 0 %
Lymphocytes Relative: 26 %
Lymphs Abs: 1.8 10*3/uL (ref 0.7–4.0)
MCH: 28.6 pg (ref 26.0–34.0)
MCHC: 33.5 g/dL (ref 30.0–36.0)
MCV: 85.3 fL (ref 80.0–100.0)
Monocytes Absolute: 0.5 10*3/uL (ref 0.1–1.0)
Monocytes Relative: 7 %
Neutro Abs: 4.5 10*3/uL (ref 1.7–7.7)
Neutrophils Relative %: 66 %
Platelet Count: 330 10*3/uL (ref 150–400)
RBC: 5.04 MIL/uL (ref 3.87–5.11)
RDW: 12.1 % (ref 11.5–15.5)
WBC Count: 7 10*3/uL (ref 4.0–10.5)
nRBC: 0 % (ref 0.0–0.2)

## 2020-03-07 LAB — CMP (CANCER CENTER ONLY)
ALT: 12 U/L (ref 0–44)
AST: 14 U/L — ABNORMAL LOW (ref 15–41)
Albumin: 3.5 g/dL (ref 3.5–5.0)
Alkaline Phosphatase: 73 U/L (ref 38–126)
Anion gap: 12 (ref 5–15)
BUN: 11 mg/dL (ref 6–20)
CO2: 27 mmol/L (ref 22–32)
Calcium: 9.2 mg/dL (ref 8.9–10.3)
Chloride: 94 mmol/L — ABNORMAL LOW (ref 98–111)
Creatinine: 0.8 mg/dL (ref 0.44–1.00)
GFR, Est AFR Am: >60 mL/min (ref >60)
GFR, Estimated: >60 mL/min (ref >60)
Glucose, Bld: 637 mg/dL (ref 70–99)
Potassium: 4.2 mmol/L (ref 3.5–5.1)
Sodium: 133 mmol/L — ABNORMAL LOW (ref 135–145)
Total Bilirubin: 0.3 mg/dL (ref 0.3–1.2)
Total Protein: 7.3 g/dL (ref 6.5–8.1)

## 2020-03-07 LAB — GENETIC SCREENING ORDER

## 2020-03-07 MED ORDER — METFORMIN HCL 500 MG PO TABS: 500.0000 mg | ORAL_TABLET | Freq: Two times a day (BID) | ORAL | 3 refills | Status: DC

## 2020-03-07 MED ORDER — INSULIN REGULAR HUMAN 100 UNIT/ML IJ SOLN: 10.0000 [IU] | Freq: Once | INTRAMUSCULAR | Status: DC

## 2020-03-07 MED ORDER — INSULIN REGULAR HUMAN 100 UNIT/ML IJ SOLN
INTRAMUSCULAR | Status: AC
  Filled 2020-03-07: qty 1

## 2020-03-07 NOTE — Therapy (Addendum)
[place], [place], [address] Phone: [phone]   Fax:  [phone]  Physical Therapy Evaluation  Patient Details  Name: [name] MRN: [id number] Date of Birth: [birth year] Referring Provider (PT): Dr. [name]   Encounter Date: 03/07/2020  PT End of Session - 03/07/20 1301    Visit Number  1    Number of Visits  2    Date for PT Re-Evaluation  05/02/20    PT Start Time  0952    PT Stop Time  1022    PT Time Calculation (min)  30 min    Activity Tolerance  Patient tolerated treatment well    Behavior During Therapy  [facility] for tasks assessed/performed       Past Medical History:  Diagnosis Date  . Bipolar 1 disorder ([place])   . Diabetes mellitus type 2 in obese ([place])   . Family history of prostate cancer   . Obesity   . Seasonal allergies     Past Surgical History:  Procedure Laterality Date  . OOPHORECTOMY  2009    There were no vitals filed for this visit.   Subjective Assessment - 03/07/20 1027    Subjective  Patient reports she is here today to be seen by her medical team for her newly diagnosed right breast cancer.    Patient is accompained by:  Family member    Pertinent History  Patient was diagnosed on 02/27/2020 with right invasive ductal carcinoma breast cancer. It is an area of distortion in the power inner quadrant. It is ER/PR positive and HER2 negative with a Ki67 of 2%. She has diabetes and today has a blood glucose level of > 600. She reports she does not take her diabetic medication.    Patient Stated Goals  Reduce lymphedema risk and learn post op shoulder ROM HEP    Currently in Pain?  No/denies         [facility] PT Assessment - 03/07/20 0001      Assessment   Medical Diagnosis  Right breast cancer    Referring Provider (PT)  Dr. [name]    Onset Date/Surgical Date  02/27/20    Hand Dominance  Right    Prior Therapy  none      Precautions   Precautions  Other (comment)    Precaution Comments  active cancer; uncontrolled diabetes      Restrictions   Weight Bearing Restrictions  No      Balance Screen   Has the patient fallen in the past 6 months  No    Has the patient had a decrease in activity level because of a fear of falling?   No    Is the patient reluctant to leave their home because of a fear of falling?   No      Home Environment   Living Environment  Private residence    Living Arrangements  Spouse/significant other    Available Help at Discharge  Family      Prior Function   Level of Independence  Independent    Vocation  Full time employment    Solicitor    Leisure  She does not exercise      Cognition   Overall Cognitive Status  Within Functional Limits for tasks assessed      Observation/Other Assessments   Observations  L-Dex score +1.6      Posture/Postural Control   Posture/Postural Control  Postural limitations    Postural Limitations  Rounded Shoulders;Forward head      ROM / Strength   AROM / PROM / Strength  AROM;Strength      AROM   AROM Assessment Site  Shoulder    Right/Left Shoulder  Right;Left    Right Shoulder Extension  50 Degrees    Right Shoulder Flexion  148 Degrees    Right Shoulder ABduction  156 Degrees    Right Shoulder Internal Rotation  71 Degrees    Right Shoulder External Rotation  82 Degrees    Left Shoulder Extension  44 Degrees    Left Shoulder Flexion  139 Degrees    Left Shoulder ABduction  146 Degrees    Left Shoulder Internal Rotation  84 Degrees    Left Shoulder External Rotation  77 Degrees      Strength   Overall Strength  Within functional limits for tasks performed        LYMPHEDEMA/ONCOLOGY QUESTIONNAIRE - 03/07/20 1259      Type   Cancer Type  Right breast cancer      Lymphedema Assessments   Lymphedema Assessments  Upper extremities      Right Upper Extremity Lymphedema   10 cm Proximal to Olecranon Process   35.9 cm    Olecranon Process  27.7 cm    10 cm Proximal to Ulnar Styloid Process  27.6 cm    Just Proximal to Ulnar Styloid Process  19.6 cm    Across Hand at [employer]  21.2 cm    At [place] of 2nd Digit  7.4 cm      Left Upper Extremity Lymphedema   10 cm Proximal to Olecranon Process  35.1 cm    Olecranon Process  29.8 cm    10 cm Proximal to Ulnar Styloid Process  29.1 cm    Just Proximal to Ulnar Styloid Process  20.1 cm    Across Hand at [employer]  21.5 cm    At [place] of 2nd Digit  7.3 cm          Quick Dash - 03/07/20 0001    Open a tight or new jar  Mild difficulty    Do heavy household chores (wash walls, wash floors)  Moderate difficulty    Carry a shopping bag or briefcase  Mild difficulty    Wash your back  Mild difficulty    Use a knife to cut food  No difficulty    Recreational activities in which you take some force or impact through your arm, shoulder, or hand (golf, hammering, tennis)  Mild difficulty    During the past week, to what extent has your arm, shoulder or hand problem interfered with your normal social activities with family, friends, neighbors, or groups?  Slightly    During the past week, to what extent has your arm, shoulder or hand problem limited your work or other regular daily activities  Quite a bit    Arm, shoulder, or hand pain.  Mild    Tingling (pins and needles) in your arm, shoulder, or hand  None    Difficulty Sleeping  No difficulty    DASH Score  25 %        Objective measurements completed on examination: See above findings.     Patient was instructed today in a home exercise program today for post op shoulder range of motion. These included active assist shoulder flexion in sitting, scapular retraction, wall walking with  shoulder abduction, and hands behind head external rotation.  She was encouraged to do these twice a day, holding 3 seconds and repeating 5 times when permitted by her physician.       PT Education  - 03/07/20 1300    Education Details  Lymphedema risk reduction and post op shoulder ROM HEP    Person(s) Educated  Patient;Spouse    Methods  Explanation;Demonstration;Handout    Comprehension  Returned demonstration;Verbalized understanding          PT Long Term Goals - 03/07/20 1419      PT LONG TERM GOAL #1   Title  Patient will demonstrate she has regained full shoulder ROM and function post operatively compared to baselines.    Time  8    Period  Weeks    Status  New    Target Date  05/02/20      Breast Clinic Goals - 03/07/20 1419      Patient will be able to verbalize understanding of pertinent lymphedema risk reduction practices relevant to her diagnosis specifically related to skin care.   Time  1    Period  Days    Status  Achieved      Patient will be able to return demonstrate and/or verbalize understanding of the post-op home exercise program related to regaining shoulder range of motion.   Time  1    Period  Days    Status  Achieved      Patient will be able to verbalize understanding of the importance of attending the postoperative After Breast Cancer Class for further lymphedema risk reduction education and therapeutic exercise.   Time  1    Period  Days    Status  Achieved            Plan - 03/07/20 1301    Clinical Impression Statement  Patient was diagnosed on 02/27/2020 with right invasive ductal carcinoma breast cancer. It is an area of distortion in the power inner quadrant. It is ER/PR positive and HER2 negative with a Ki67 of 2%. She has diabetes and today has a blood glucose level of > 600. She reports she does not take her diabetic medication. Her multidiscipinary medical team met prior to her assessments to determine a recommended treatment plan. She is planning to have a mammaprint test on the core biopsy, a right lumpectomy and sentinel node biopsy followed by radiation and anti-estrogen therapy. She will benefit from a post op PT  reassessment to determine needs.    Stability/Clinical Decision Making  Stable/Uncomplicated    Clinical Decision Making  Low    Rehab Potential  Excellent    PT Frequency  --   Eval and 1 f/u visit followed by L-Dex screenings every 3 months   PT Treatment/Interventions  ADLs/Self Care Home Management;Therapeutic exercise;Patient/family education    PT Next Visit Plan  Reassess 3-4 weeks post op to determine needs    PT Home Exercise Plan  Post op shoulder ROM HEP    Consulted and Agree with Plan of Care  Patient;Family member/caregiver    Family Member Consulted  Husband       Patient will benefit from skilled therapeutic intervention in order to improve the following deficits and impairments:  Postural dysfunction, Decreased range of motion, Decreased knowledge of precautions, Impaired UE functional use, Pain  Visit Diagnosis: Malignant neoplasm of lower-inner quadrant of right breast of female, estrogen receptor positive ([place]) - Plan: PT plan of care cert/re-cert  Abnormal posture -  Plan: PT plan of care cert/re-cert   Patient will follow up at outpatient cancer rehab 3-4 weeks following surgery.  If the patient requires physical therapy at that time, a specific plan will be dictated and sent to the referring physician for approval. The patient was educated today on appropriate basic range of motion exercises to begin post operatively and the importance of attending the After Breast Cancer class following surgery.  Patient was educated today on lymphedema risk reduction practices as it pertains to recommendations that will benefit the patient immediately following surgery.  She verbalized good understanding.    The patient was assessed using the L-Dex machine today to produce a lymphedema index baseline score. The patient will be reassessed on a regular basis (typically every 3 months) to obtain new L-Dex scores. If the score is > 6.5 points away from his/her baseline score indicating  onset of subclinical lymphedema, it will be recommended to wear a compression garment for 4 weeks, 12 hours per day and then be reassessed. If the score continues to be > 6.5 points from baseline at reassessment, we will initiate lymphedema treatment. Assessing in this manner has a 95% rate of preventing clinically significant lymphedema.    Problem List Patient Active Problem List   Diagnosis Date Noted  . Morbid obesity with BMI of 40.0-44.9, adult ([place]) 03/07/2020  . Type 2 diabetes mellitus, uncontrolled ([place]) 03/07/2020  . Fibroadenoma of right breast 03/07/2020  . Family history of prostate cancer   . Malignant neoplasm of upper-inner quadrant of right breast in female, estrogen receptor positive ([place]) 03/05/2020  . Embedded earring of left ear 08/11/2014  . Diabetes mellitus type 2 in obese ([place])   . Seasonal allergies   . Mood disorder ([place]) 08/08/2014  . Bipolar 1 disorder, manic, moderate ([place]) 08/08/2014   Annia Friendly, PT 03/07/20 2:24 PM  [place], [place], [address] Phone: [phone]   Fax:  [phone]  Name: Hawraa Stambaugh Tuley MRN: [id number] Date of Birth: 30-May-1976

## 2020-03-07 NOTE — Progress Notes (Signed)
Late entry:  At 9:08am, received critical lab:  Glucose 637.  Verbally communicated to V.Nicola Girt, RN with repeat back.

## 2020-03-07 NOTE — Telephone Encounter (Signed)
This RN received call stating panic lab for glucose at 637.  Informed MD who gave order for 10 units of regular insulin.  Informed pt of above value and MD order-pt states she " has never used insulin before - as well as she ate a waffle with syrup this AM".  Pt requested to wait on insulin until she speaks with medical oncologist.  Dr Jana Hakim made aware of above.

## 2020-03-07 NOTE — Telephone Encounter (Signed)
Ordered mammaprint (core) per Dr. Jana Hakim.  Faxed requisition to pathology and [facility].

## 2020-03-07 NOTE — Progress Notes (Signed)
REFERRING PROVIDER: Chauncey Cruel, MD [place],  [place] [address]  PRIMARY PROVIDER:  Precious Gilding, [place]  PRIMARY REASON FOR VISIT:  1. Malignant neoplasm of upper-inner quadrant of right breast in female, estrogen receptor positive ([place])   2. Family history of prostate cancer      I connected with [name] on 03/07/2020 at 11:30 am EDT by [facility] video conference and verified that I am speaking with the correct person using two identifiers.   Patient location: [facility] clinic Provider location: [facility] office  HISTORY OF PRESENT ILLNESS:   [name], a 44 y.o. female, was seen for a [facility] cancer genetics consultation at the request of Dr. Jana Hakim due to a personal history of young-onset breast cancer.  [name] presents to clinic today to discuss the possibility of a hereditary predisposition to cancer, genetic testing, and to further clarify her future cancer risks, as well as potential cancer risks for family members.   In 2021, at the age of 89, [name] was diagnosed with invasive ductal carcinoma of the right breast. The treatment plan includes surgery, mammaprint, radiation therapy, and antiestrogen therapy.     CANCER HISTORY:  Oncology History   No history exists.    RISK FACTORS:  Menarche was at age 36.  No live births.  OCP use for approximately 12 years.  Ovaries intact: yes - right ovarian cyst and fallopian tube removed in 2009.  Hysterectomy: no.  Menopausal status: premenopausal.  HRT use: 0 years. Colonoscopy: n/a. Mammogram within the last year: yes. Number of breast biopsies: 2. Any excessive radiation exposure in the past: no.   Past Medical History:  Diagnosis Date  . Bipolar 1 disorder ([place])   . Diabetes mellitus type 2 in obese ([place])   . Family history of prostate cancer   . Obesity   . Seasonal allergies     Past Surgical History:  Procedure Laterality Date  . OOPHORECTOMY  2009    Social History    Socioeconomic History  . Marital status: Married    Spouse name: Not on file  . Number of children: Not on file  . Years of education: Not on file  . Highest education level: Not on file  Occupational History  . Not on file  Tobacco Use  . Smoking status: Former Research scientist (life sciences)  . Smokeless tobacco: Never Used  Substance and Sexual Activity  . Alcohol use: Yes    Comment: social  . Drug use: No  . Sexual activity: Yes    Birth control/protection: Pill  Other Topics Concern  . Not on file  Social History Narrative   Works   Scientist, physiological Strain:   . Difficulty of Paying Living Expenses:   Food Insecurity:   . Worried About Charity fundraiser in the Last Year:   . Arboriculturist in the Last Year:   Transportation Needs:   . Film/video editor (Medical):   Marland Kitchen Lack of Transportation (Non-Medical):   Physical Activity:   . Days of Exercise per Week:   . Minutes of Exercise per Session:   Stress:   . Feeling of Stress :   Social Connections:   . Frequency of Communication with Friends and Family:   . Frequency of Social Gatherings with Friends and Family:   . Attends Religious Services:   . Active Member of Clubs or Organizations:   . Attends Archivist Meetings:   .  Marital Status:      FAMILY HISTORY:  We obtained a detailed, 4-generation family history.  Significant diagnoses are listed below: Family History  Problem Relation Age of Onset  . Heart disease Other   . Macular degeneration Other   . Mental illness Other        both sides of family  . Prostate cancer Paternal Uncle 45  . Immunodeficiency Maternal Aunt   . Dementia Maternal Grandmother   . Diabetes Maternal Aunt    [name] does not have children. She has three brothers and one sister ranging in age from 32 to 73. Two of her brothers are paternal half-brothers. Her sister had a recent suspicious mammogram that she is being follow for, but has not been  diagnosed with cancer.  [name] mother is currently living at the age of 69 and does not have a history of cancer. [name] mother had two maternal half-sisters. One died around age 50 and had an immunodeficiency disorder (described as like living in a bubble). The other died when she was older than 60 from diabetes and kidney failure. [name] maternal grandmother died in her 77s from dementia, and her maternal grandfather died in his 51s. There are no known diagnoses of cancer on the maternal side of the family.  [name] father is currently living at the age of 31 and does not have a history of cancer. She has one paternal aunt (age 47) and one paternal uncle (age 38). Her uncle was diagnosed with prostate cancer at the age of 15 that was aggressive, but not metastatic. Her paternal grandmother died when she was 75, and her paternal grandfather died when he was 54. There are no other known diagnoses of cancer on the paternal side of the family.  [name] is unaware of previous family history of genetic testing for hereditary cancer risks. Her maternal and paternal ancestors are of [place] and [place] descent. There is no reported Ashkenazi Jewish ancestry. There is no known consanguinity.  GENETIC COUNSELING ASSESSMENT: [name] is a 44 y.o. female with a personal history of young-onset breast cancer and a family history of prostate cancer, which is somewhat suggestive of a hereditary cancer syndrome and predisposition to cancer. We, therefore, discussed and recommended the following at today's visit.   DISCUSSION: We discussed that 5-10% of breast cancer is hereditary, with most cases associated with the BRCA1 and BRCA2 genes.  There are other genes that can be associated with hereditary breast cancer syndromes.  These include ATM, CHEK2, PALB2, etc.  We discussed that testing is beneficial for several reasons including knowing about other cancer risks, identifying potential screening  and risk-reduction options that may be appropriate, and to understand if other family members could be at risk for cancer and allow them to undergo genetic testing.   We reviewed the characteristics, features and inheritance patterns of hereditary cancer syndromes. We also discussed genetic testing, including the appropriate family members to test, the process of testing, insurance coverage and turn-around-time for results. We discussed the implications of a negative, positive and/or variant of uncertain significant result. In order to get genetic test results in a timely manner so that [name] can use these genetic test results for surgical decisions, we recommended [name] pursue genetic testing for the [facility] Breast Cancer STAT panel. Once complete, we recommend [name] pursue reflex genetic testing to the Common Hereditary Cancers panel.   The Breast Cancer STAT panel offered by [facility] includes  sequencing and rearrangement analysis for the following 9 genes:  ATM, BRCA1, BRCA2, CDH1, CHEK2, PALB2, PTEN, STK11 and TP53.  The Common Hereditary Cancers Panel offered by [facility] includes sequencing and/or deletion duplication testing of the following 48 genes: APC, ATM, AXIN2, BARD1, BMPR1A, BRCA1, BRCA2, BRIP1, CDH1, CDK4, CDKN2A (p14ARF), CDKN2A (p16INK4a), CHEK2, CTNNA1, DICER1, EPCAM (Deletion/duplication testing only), GREM1 (promoter region deletion/duplication testing only), KIT, MEN1, MLH1, MSH2, MSH3, MSH6, MUTYH, NBN, NF1, NHTL1, PALB2, PDGFRA, PMS2, POLD1, POLE, PTEN, RAD50, RAD51C, RAD51D, RNF43, SDHB, SDHC, SDHD, SMAD4, SMARCA4. STK11, TP53, TSC1, TSC2, and VHL.  The following genes are evaluated for sequence changes only: SDHA and HOXB13 c.251G>A variant only.   Based on Ms. Bohl's personal history of cancer, she meets medical criteria for genetic testing. Despite that she meets criteria, she may still have an out of pocket cost.   PLAN: After considering the risks, benefits, and  limitations, [name] provided informed consent to pursue genetic testing and the blood sample was sent to [facility] for analysis of the Breast Cancer STAT panel + Common Hereditary Cancers panel. Results should be available within approximately one-two weeks' time, at which point they will be disclosed by telephone to [name], as will any additional recommendations warranted by these results. [name] will receive a summary of her genetic counseling visit and a copy of her results once available. This information will also be available in Epic.   [name] questions were answered to her satisfaction today. Our contact information was provided should additional questions or concerns arise. Thank you for the referral and allowing [place] to share in the care of your patient.   Clint Guy, MS, [facility] Genetic Counselor [email or web address] Phone: [phone]  The patient was seen for a total of 20 minutes in face-to-face genetic counseling.  This patient was discussed with Drs. Magrinat, Lindi Adie and/or Burr Medico who agrees with the above.    _______________________________________________________________________ For Office Staff:  Number of people involved in session: 1 Was an Intern/ student involved with case: no

## 2020-03-07 NOTE — Progress Notes (Signed)
Radiation Oncology         [phone] ________________________________  Name: [name]        MRN: [id number]  Date of Service: 03/07/2020 DOB: June 28, 1976  TK:[id number], [place], [place]  [place], [place], [place]     REFERRING PHYSICIAN: [place], [place], [place]   DIAGNOSIS: The encounter diagnosis was Malignant neoplasm of upper-inner quadrant of right breast in female, estrogen receptor positive ([place]).   HISTORY OF PRESENT ILLNESS: [name] is a 44 y.o. female seen in the multidisciplinary breast clinic for a new diagnosis of right breast cancer. The patient was noted to have a distortion on a diagnostic mammogram.  She has been followed for a benign-appearing mass.  Her further diagnostic images revealed distortion in the inner right breast without ultrasound correlate, her axilla was negative for adenopathy.  The mass that was being followed was also noted again in the 9 o'clock position and was felt to be stable.  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  Because of the findings she went back for a second biopsy of the mass that has been followed on 03/05/2020 and this was negative for malignancy.  She is seen today to discuss treatment recommendations for her cancer.   PREVIOUS RADIATION THERAPY: No   PAST MEDICAL HISTORY:  Past Medical History:  Diagnosis Date  . Bipolar 1 disorder ([place])   . Diabetes mellitus type 2 in obese ([place])   . Obesity   . Seasonal allergies        PAST SURGICAL HISTORY: Past Surgical History:  Procedure Laterality Date  . OOPHORECTOMY  2009     FAMILY HISTORY:  Family History  Problem Relation Age of Onset  . Heart disease Other   . Macular degeneration Other   . Mental illness Other        both sides of family     SOCIAL HISTORY:  reports that she has never smoked.  She does not have any smokeless tobacco history on file. She reports current alcohol use. She reports that she does not use drugs.  The patient is married and lives in [place].  She works for [employer].   ALLERGIES: Patient has no known allergies.   MEDICATIONS:  Current Outpatient Medications  Medication Sig Dispense Refill  . ALPRAZolam (XANAX) 0.25 MG tablet Take 1 tablet (0.25 mg) two times daily as needed for anxiety 30 tablet 0  . benztropine (COGENTIN) 1 MG tablet Take 1 tablet (1 mg total) by mouth 2 (two) times daily. For prevention of drug induced involuntary movement 60 tablet 0  . cephALEXin (KEFLEX) 500 MG capsule Take 1 capsule (500 mg total) by mouth 2 (two) times daily. For infection    . cetirizine (ZYRTEC) 10 MG tablet Take 1 tablet (10 mg total) by mouth every morning. For allergies    . divalproex (DEPAKOTE ER) 500 MG 24 hr tablet Take 1 tablet (500 mg total) by mouth 2 (two) times daily. For mood stabilization 60 tablet 0  . metFORMIN (GLUCOPHAGE) 500 MG tablet Take 1 tablet (500 mg total) by mouth 2 (two) times daily with a meal. For diabetes management 60 tablet 0  . mupirocin cream (BACTROBAN) 2 % Apply topically 2 (two) times daily. For wound care 15 g 0  . norethindrone-ethinyl estradiol (JUNEL FE,GILDESS FE,LOESTRIN FE) 1-20 MG-MCG  tablet Take 1 tablet by mouth daily. For birth control method 1 Package 11  . OLANZapine zydis (ZYPREXA) 10 MG disintegrating tablet Take 1 tablet (10 mg total) by mouth 2 (two) times daily. For mood control 60 tablet 0  . sulfamethoxazole-trimethoprim (BACTRIM DS) 800-160 MG per tablet Take 2 tablets by mouth 2 (two) times daily. For infection    . traZODone (DESYREL) 50 MG tablet Take 1 tablet (50 mg total) by mouth at bedtime and may repeat dose one time if needed. For sleep 60 tablet 0   No current facility-administered medications for this encounter.     REVIEW OF SYSTEMS: On review of systems, the patient reports that she is doing  well overall. She denies headaches or visual disturbances. She denies any chest pain, shortness of breath, cough, fevers, chills, night sweats, unintended weight changes. She denies any bowel or bladder disturbances, and denies abdominal pain, nausea or vomiting. She denies any new musculoskeletal or joint aches or pains. A complete review of systems is obtained and is otherwise negative.     PHYSICAL EXAM:  Wt Readings from Last 3 Encounters:  No data found for Wt   Temp Readings from Last 3 Encounters:  08/08/14 98.9 F (37.2 C) (Oral)  08/09/13 98.6 F (37 C) (Oral)   BP Readings from Last 3 Encounters:  08/08/14 141/97  08/09/13 (!) 165/102   Pulse Readings from Last 3 Encounters:  08/08/14 (!) 124  08/09/13 72    In general this is a well appearing caucasin female in no acute distress. She's alert and oriented x4 and appropriate throughout the examination. Cardiopulmonary assessment is negative for acute distress and she exhibits normal effort. Bilateral breast exam is deferred.    ECOG = 0  0 - Asymptomatic (Fully active, able to carry on all predisease activities without restriction)  1 - Symptomatic but completely ambulatory (Restricted in physically strenuous activity but ambulatory and able to carry out work of a light or sedentary nature. For example, light housework, office work)  2 - Symptomatic, <50% in bed during the day (Ambulatory and capable of all self care but unable to carry out any work activities. Up and about more than 50% of waking hours)  3 - Symptomatic, >50% in bed, but not bedbound (Capable of only limited self-care, confined to bed or chair 50% or more of waking hours)  4 - Bedbound (Completely disabled. Cannot carry on any self-care. Totally confined to bed or chair)  5 - Death   Eustace Pen MM, Creech RH, Tormey DC, et al. [phone]). "Toxicity and response criteria of the [facility]". [place] Oncol. 5 (6):  649-55    LABORATORY DATA:  Lab Results  Component Value Date   WBC 14.1 (H) 08/08/2014   HGB 12.9 08/08/2014   HCT 38.7 08/08/2014   MCV 79.5 08/08/2014   PLT 386 08/08/2014   Lab Results  Component Value Date   NA 137 08/14/2014   K 4.4 08/14/2014   CL 98 08/14/2014   CO2 23 08/14/2014   Lab Results  Component Value Date   ALT 14 08/08/2014   AST 23 08/08/2014   ALKPHOS 68 08/08/2014   BILITOT 0.4 08/08/2014      RADIOGRAPHY: US BREAST LTD UNI RIGHT INC AXILLA  Addendum Date: 03/02/2020   ADDENDUM REPORT: 03/02/2020 12:30 ADDENDUM: The right axillary nodes were normal in appearance. Electronically Signed   By: Dorise Bullion III M.D   On: 03/02/2020 12:30  Result Date: 03/02/2020 CLINICAL DATA:  Follow-up of a right breast mass. EXAM: DIGITAL DIAGNOSTIC BILATERAL MAMMOGRAM WITH CAD AND TOMO ULTRASOUND RIGHT BREAST COMPARISON:  Previous exam(s). ACR Breast Density Category b: There are scattered areas of fibroglandular density. FINDINGS: The mass in the lateral anterior right breast is stable mammographically. There is new distortion noted in the medial inferior right breast. No other suspicious findings identified in either breast. Mammographic images were processed with CAD. On physical exam, no suspicious lumps are identified. Targeted ultrasound is performed, showing the mass at 9 o'clock, 2 cm from the nipple remains measuring 7 x 4 x 6 mm today versus 7 x 5 x 7 mm in March of 2020. No abnormalities are seen in the medial inferior right breast to correlate with the distortion. IMPRESSION: New distortion in the medial inferior right breast. Stable probably benign right breast mass at 9 o'clock, 2 cm from the nipple. No evidence of malignancy in the left breast. RECOMMENDATION: Stereotactic biopsy of the new medial right breast distortion. If the biopsy demonstrates malignancy, recommend biopsy of the 9 o'clock right breast mass. If the biopsy does not demonstrate malignancy,  recommend a follow-up of the 9 o'clock right breast mass in 1 year to ensure at least 2 years of stability. I have discussed the findings and recommendations with the patient. If applicable, a reminder letter will be sent to the patient regarding the next appointment. BI-RADS CATEGORY  4: Suspicious. Electronically Signed: By: Dorise Bullion III M.D On: 02/27/2020 09:13   MM DIAG BREAST TOMO BILATERAL  Addendum Date: 03/02/2020   ADDENDUM REPORT: 03/02/2020 12:30 ADDENDUM: The right axillary nodes were normal in appearance. Electronically Signed   By: Dorise Bullion III M.D   On: 03/02/2020 12:30   Result Date: 03/02/2020 CLINICAL DATA:  Follow-up of a right breast mass. EXAM: DIGITAL DIAGNOSTIC BILATERAL MAMMOGRAM WITH CAD AND TOMO ULTRASOUND RIGHT BREAST COMPARISON:  Previous exam(s). ACR Breast Density Category b: There are scattered areas of fibroglandular density. FINDINGS: The mass in the lateral anterior right breast is stable mammographically. There is new distortion noted in the medial inferior right breast. No other suspicious findings identified in either breast. Mammographic images were processed with CAD. On physical exam, no suspicious lumps are identified. Targeted ultrasound is performed, showing the mass at 9 o'clock, 2 cm from the nipple remains measuring 7 x 4 x 6 mm today versus 7 x 5 x 7 mm in March of 2020. No abnormalities are seen in the medial inferior right breast to correlate with the distortion. IMPRESSION: New distortion in the medial inferior right breast. Stable probably benign right breast mass at 9 o'clock, 2 cm from the nipple. No evidence of malignancy in the left breast. RECOMMENDATION: Stereotactic biopsy of the new medial right breast distortion. If the biopsy demonstrates malignancy, recommend biopsy of the 9 o'clock right breast mass. If the biopsy does not demonstrate malignancy, recommend a follow-up of the 9 o'clock right breast mass in 1 year to ensure at least 2  years of stability. I have discussed the findings and recommendations with the patient. If applicable, a reminder letter will be sent to the patient regarding the next appointment. BI-RADS CATEGORY  4: Suspicious. Electronically Signed: By: Dorise Bullion III M.D On: 02/27/2020 09:13   MM CLIP PLACEMENT RIGHT  Result Date: 03/05/2020 CLINICAL DATA:  Ultrasound-guided biopsy was performed of a mass in the 9 o'clock axis of the right breast 2 cm from the nipple. Mass measures 7  mm. EXAM: DIAGNOSTIC RIGHT MAMMOGRAM POST ULTRASOUND BIOPSY COMPARISON:  Previous exam(s). FINDINGS: Mammographic images were obtained following ultrasound guided biopsy of the right breast. The biopsy marking clip is in expected position at the site of biopsy. IMPRESSION: Appropriate positioning of the ribbon shaped biopsy marking clip at the site of biopsy in the lower outer quadrant. Final Assessment: Post Procedure Mammograms for Marker Placement Electronically Signed   By: Curlene Dolphin M.D.   On: 03/05/2020 10:06   MM CLIP PLACEMENT RIGHT  Result Date: 03/01/2020 CLINICAL DATA:  Patient status post stereotactic guided biopsy right breast distortion. EXAM: DIAGNOSTIC RIGHT MAMMOGRAM POST STEREOTACTIC BIOPSY COMPARISON:  Previous exam(s). FINDINGS: Mammographic images were obtained following stereotactic guided biopsy of right breast distortion. The biopsy marking clip is in expected position at the site of biopsy. IMPRESSION: Appropriate positioning of the X shaped biopsy marking clip at the site of biopsy in the right breast distortion. Final Assessment: Post Procedure Mammograms for Marker Placement Electronically Signed   By: Lovey Newcomer M.D.   On: 03/01/2020 13:02   MM RT BREAST BX W LOC DEV 1ST LESION IMAGE BX SPEC STEREO GUIDE  Addendum Date: 03/02/2020   ADDENDUM REPORT: 03/02/2020 14:46 ADDENDUM: Pathology revealed GRADE I INVASIVE DUCTAL CARCINOMA, ADENOSIS WITH CALCIFICATIONS of the RIGHT breast, upper inner. This  was found to be concordant by Dr. Lovey Newcomer. Pathology results were discussed with the patient by telephone. The patient reported doing well after the biopsy with tenderness at the site. Post biopsy instructions and care were reviewed and questions were answered. The patient was encouraged to call [facility] for any additional concerns. The patient is scheduled for ultrasound guided biopsy of the 9 o'clock RIGHT breast mass on March 05, 2020. The patient was referred to [facility] on March 07, 2020. Pathology results reported by Stacie Acres RN on 03/02/2020. Electronically Signed   By: Lovey Newcomer M.D.   On: 03/02/2020 14:46   Result Date: 03/02/2020 CLINICAL DATA:  Patient with right breast distortion. EXAM: RIGHT BREAST STEREOTACTIC CORE NEEDLE BIOPSY COMPARISON:  Previous exams. FINDINGS: The patient and I discussed the procedure of stereotactic-guided biopsy including benefits and alternatives. We discussed the high likelihood of a successful procedure. We discussed the risks of the procedure including infection, bleeding, tissue injury, clip migration, and inadequate sampling. Informed written consent was given. The usual time out protocol was performed immediately prior to the procedure. Using sterile technique and 1% Lidocaine as local anesthetic, under stereotactic guidance, a 9 gauge vacuum assisted device was used to perform core needle biopsy of distortion within the medial right breast using a medial approach. Lesion quadrant: Upper inner quadrant At the conclusion of the procedure, X shaped tissue marker clip was deployed into the biopsy cavity. Follow-up 2-view mammogram was performed and dictated separately. IMPRESSION: Stereotactic-guided biopsy of right breast distortion. No apparent complications. Electronically Signed: By: Lovey Newcomer M.D. On: 03/01/2020 13:01   [place] RT BREAST BX W LOC DEV  1ST LESION IMG BX SPEC US GUIDE  Addendum Date: 03/06/2020   ADDENDUM REPORT: 03/06/2020 14:19 ADDENDUM: Pathology revealed FIBROADENOMA of the RIGHT breast, 9 o'clock. This was found to be concordant by Dr. Curlene Dolphin. Pathology results were discussed with the patient by telephone. The patient reported doing well after the biopsy with tenderness at the site. Post biopsy instructions and care were reviewed and questions were answered. The patient was encouraged to  call [facility] for any additional concerns. The patient has a recent diagnosis of right breast cancer and should follow her outlined treatment plan. Pathology results reported by Stacie Acres RN on 03/06/2020. Electronically Signed   By: Curlene Dolphin M.D.   On: 03/06/2020 14:19   Result Date: 03/06/2020 CLINICAL DATA:  Ultrasound-guided core needle biopsy was recommended of a mass in the 9 o'clock axis of the right breast 2 cm from nipple. Patient was recently diagnosed with grade 1 invasive ductal carcinoma following biopsy right breast distortion upper inner quadrant. EXAM: ULTRASOUND GUIDED RIGHT BREAST CORE NEEDLE BIOPSY COMPARISON:  Previous exam(s). PROCEDURE: I met with the patient and we discussed the procedure of ultrasound-guided biopsy, including benefits and alternatives. We discussed the high likelihood of a successful procedure. We discussed the risks of the procedure, including infection, bleeding, tissue injury, clip migration, and inadequate sampling. Informed written consent was given. The usual time-out protocol was performed immediately prior to the procedure. Lesion quadrant: Lower outer quadrant Using sterile technique and 1% Lidocaine as local anesthetic, under direct ultrasound visualization, a 12 gauge spring-loaded device was used to perform biopsy of a 7 mm mass in the 9 o'clock axis of the right breast using a lateral approach. At the conclusion of the procedure ribbon tissue marker clip was  deployed into the biopsy cavity. Follow up 2 view mammogram was performed and dictated separately. IMPRESSION: Ultrasound guided biopsy of the right breast. No apparent complications. Electronically Signed: By: Curlene Dolphin M.D. On: 03/05/2020 10:05       IMPRESSION/PLAN: 1. Stage unknown, [id number] grade 1 ER/PR positive invasive ductal carcinoma of the right breast. Dr. Lisbeth Renshaw discusses the pathology findings and reviews the nature of right breast disease. Dr. Jana Hakim recommends mammaprint from her core biopsy to determine a role for systemic therapy. Additional discussion was to consider genetic testing, and make surgical determinations once her mammaprint and genetic testing has resulted. Surgery would be anticipated initially, and if there was a role for systemic therapy it would be in the adjuvant setting. She would also benefit if she has breast conserving surgery to receive external radiotherapy to the breast followed by antiestrogen therapy. At this time, unless there was a significantly different situation following surgery, he would not anticipate radiotherapy if she undergoes mastectomy. We discussed the risks, benefits, short, and long term effects of radiotherapy. Dr. Lisbeth Renshaw discusses the delivery and logistics of radiotherapy and anticipates a course of 6 1/2 weeks of radiotherapy. If she has breast conserving surgery. We will follow along with her decision making. 2. Possible genetic predisposition to malignancy. The patient is a candidate for genetic testing given her personal and family history. She was offered referral and will be seen today as these results may determine how she wishes to proceed with surgical approach. 3. Hyperglycemia. The patient had a glucose of 637. She was offered insulin and evaluation in the ED by Dr. Jana Hakim, but declined and will resume oral metformin. This will be followed expectantly.  In a visit lasting 60 minutes, greater than 50% of the time was spent face  to face reviewing her case, as well as in preparation of, discussing, and coordinating the patient's care.  The above documentation reflects my direct findings during this shared patient visit. Please see the separate note by Dr. Lisbeth Renshaw on this date for the remainder of the patient's plan of care.    Carola Rhine, PAC

## 2020-03-07 NOTE — Patient Instructions (Signed)

## 2020-03-07 NOTE — Telephone Encounter (Signed)
Per MD review with pt per noted glucose of 637- plan is to not give insulin today- pt is to resume metformin - order given with request to send to pt's pharmacy.  Prescription sent to verified pharmacy and noted as received.

## 2020-03-08 ENCOUNTER — Telehealth: Payer: Self-pay | Admitting: Oncology

## 2020-03-08 NOTE — Telephone Encounter (Signed)
Scheduled appt per 3/24 los. Pt confirmed appt date and time.

## 2020-03-14 ENCOUNTER — Telehealth: Payer: Self-pay

## 2020-03-14 ENCOUNTER — Other Ambulatory Visit: Payer: Self-pay | Admitting: Surgery

## 2020-03-14 DIAGNOSIS — Z853 Personal history of malignant neoplasm of breast: Secondary | ICD-10-CM

## 2020-03-14 NOTE — Telephone Encounter (Signed)
Nutrition Assessment  Reason for Assessment:  Pt attended Breast Clinic on 3/24 and was given nutrition packet by nurse navigator.    ASSESSMENT:  44 year old female with right breast cancer.  Planning lumpectomy, mammaprint, radiation and antiestrogens.    Spoke with patient via phone to introduce self and service at [facility].  Patient reports that she has gotten back no track with healthy eating to help control blood glucose and has started taken her metformin.    Medications:  Metformin  Labs: Glucose 637  Anthropometrics:   Height: 63.5 inches Weight: 250 lb BMI: 43   NUTRITION DIAGNOSIS: Food and nutrition related knowledge deficit related to new diagnosis of breast cancer as evidenced by no prior need for nutrition related information.  INTERVENTION:   Discussed briefly packet of information regarding nutritional tips for breast cancer patients.  Patient has previously met with RD regarding diabetes.  "I know what I need to eat I just got off track."  Patient states that she has been taking medications and eating healthy and is feeling better.  Reports significant other is holding her accountable.   Patient asked RD to let Dr. Gwenlyn Perking know that she is taking medication. Will send message. Contact information provided and patient knows to contact me with questions/concerns.    MONITORING, EVALUATION, and GOAL: Pt will consume a healthy plant based diet to maintain lean body mass throughout treatment.   Rajvir Ernster B. Zenia Resides, [place], [place] Registered Dietitian [phone] (pager)

## 2020-03-15 ENCOUNTER — Encounter: Payer: Self-pay | Admitting: *Deleted

## 2020-03-15 DIAGNOSIS — Z170 Estrogen receptor positive status [ER+]: Secondary | ICD-10-CM

## 2020-03-15 DIAGNOSIS — C50211 Malignant neoplasm of upper-inner quadrant of right female breast: Secondary | ICD-10-CM

## 2020-03-15 NOTE — Progress Notes (Signed)
Spoke to pt concerning [place] from 3.24.21 and Mammaprint results on core bx of LOW RISK. Discussed chemo not recommended based on the results at this time. Informed genetic testing results should be back around end of this or beginning of next week. Pt has relayed that if the genetics are positive she will have mastectomy. Denies further questions or needs at this time. Encourage pt to call with concerns. Received verbal understanding. Contact information provided.

## 2020-03-21 ENCOUNTER — Encounter: Payer: Self-pay | Admitting: *Deleted

## 2020-03-23 ENCOUNTER — Telehealth: Payer: Self-pay | Admitting: Genetic Counselor

## 2020-03-23 NOTE — Telephone Encounter (Signed)
LVM that her genetic testing is delayed and the results will come back later than originally expected. The currently estimated report date is April 13th, although this is only an estimate and the results may be available sooner or later. If this estimated date changes we will reach out to keep her updated. She is welcome to call back with questions or concerns. Left contact information.

## 2020-03-26 ENCOUNTER — Encounter: Payer: Self-pay | Admitting: Genetic Counselor

## 2020-03-26 ENCOUNTER — Encounter: Payer: Self-pay | Admitting: Oncology

## 2020-03-26 ENCOUNTER — Telehealth: Payer: Self-pay | Admitting: Genetic Counselor

## 2020-03-26 ENCOUNTER — Ambulatory Visit: Payer: Self-pay | Admitting: Genetic Counselor

## 2020-03-26 DIAGNOSIS — Z1379 Encounter for other screening for genetic and chromosomal anomalies: Secondary | ICD-10-CM | POA: Insufficient documentation

## 2020-03-26 NOTE — Progress Notes (Signed)
HPI:  [name] was previously seen in the [place] clinic due to a personal history of young-onset breast cancer and concerns regarding a hereditary predisposition to cancer. Please refer to our prior cancer genetics clinic note for more information regarding our discussion, assessment and recommendations, at the time. [name] recent genetic test results were disclosed to her, as were recommendations warranted by these results. These results and recommendations are discussed in more detail below.  CANCER HISTORY:  Oncology History  Malignant neoplasm of upper-inner quadrant of right breast in female, estrogen receptor positive ([place])  03/05/2020 Initial Diagnosis   Malignant neoplasm of upper-inner quadrant of right breast in female, estrogen receptor positive ([place])   03/25/2020 Genetic Testing   Negative genetic testing:  No pathogenic variants detected on the [facility] Breast Cancer STAT Panel or Common Hereditary Cancers Panel. The report date is 03/25/2020.  The Breast Cancer STAT Panel offered by [facility] includes sequencing and deletion/duplication analysis for the following 9 genes:  ATM, BRCA1, BRCA2, CDH1, CHEK2, PALB2, PTEN, STK11 and TP53.  The Common Hereditary Cancers Panel offered by [facility] includes sequencing and/or deletion duplication testing of the following 48 genes: APC, ATM, AXIN2, BARD1, BMPR1A, BRCA1, BRCA2, BRIP1, CDH1, CDK4, CDKN2A (p14ARF), CDKN2A (p16INK4a), CHEK2, CTNNA1, DICER1, EPCAM (Deletion/duplication testing only), GREM1 (promoter region deletion/duplication testing only), KIT, MEN1, MLH1, MSH2, MSH3, MSH6, MUTYH, NBN, NF1, NHTL1, PALB2, PDGFRA, PMS2, POLD1, POLE, PTEN, RAD50, RAD51C, RAD51D, RNF43, SDHB, SDHC, SDHD, SMAD4, SMARCA4. STK11, TP53, TSC1, TSC2, and VHL.  The following genes were evaluated for sequence changes only: SDHA and HOXB13 c.251G>A variant only.     FAMILY HISTORY:  We obtained a detailed, 4-generation family history.   Significant diagnoses are listed below: Family History  Problem Relation Age of Onset  . Heart disease Other   . Macular degeneration Other   . Mental illness Other        both sides of family  . Prostate cancer Paternal Uncle 68  . Immunodeficiency Maternal Aunt   . Dementia Maternal Grandmother   . Diabetes Maternal Aunt    [name] does not have children. She has three brothers and one sister ranging in age from 53 to 45. Two of her brothers are paternal half-brothers. Her sister had a recent suspicious mammogram that she is being followed for, but has not been diagnosed with cancer.  [name] mother is currently living at the age of 63 and does not have a history of cancer. [name] mother had two maternal half-sisters. One died around age 75 and had an immunodeficiency disorder (described as like living in a bubble). The other died when she was older than 78 from diabetes and kidney failure. [name] maternal grandmother died in her 104s from dementia, and her maternal grandfather died in his 21s. There are no known diagnoses of cancer on the maternal side of the family.  [name] father is currently living at the age of 66 and does not have a history of cancer. She has one paternal aunt (age 62) and one paternal uncle (age 13). Her uncle was diagnosed with prostate cancer at the age of 10 that was aggressive, but not metastatic. Her paternal grandmother died when she was 23, and her paternal grandfather died when he was 46. There are no other known diagnoses of cancer on the paternal side of the family.  [name] is unaware of previous family history of genetic testing for hereditary cancer risks. Her maternal and paternal  ancestors are of [place] and [place] descent. There is no reported Ashkenazi Jewish ancestry. There is no known consanguinity.  GENETIC TEST RESULTS: Genetic testing reported out on 03/25/2020 through the [facility] Breast Cancer STAT panel and Common  Hereditary Cancers panel. No pathogenic variants were detected.   The Breast Cancer STAT Panel offered by [facility] includes sequencing and deletion/duplication analysis for the following 9 genes:  ATM, BRCA1, BRCA2, CDH1, CHEK2, PALB2, PTEN, STK11 and TP53. The Common Hereditary Cancers Panel offered by [facility] includes sequencing and/or deletion duplication testing of the following 48 genes: APC, ATM, AXIN2, BARD1, BMPR1A, BRCA1, BRCA2, BRIP1, CDH1, CDK4, CDKN2A (p14ARF), CDKN2A (p16INK4a), CHEK2, CTNNA1, DICER1, EPCAM (Deletion/duplication testing only), GREM1 (promoter region deletion/duplication testing only), KIT, MEN1, MLH1, MSH2, MSH3, MSH6, MUTYH, NBN, NF1, NHTL1, PALB2, PDGFRA, PMS2, POLD1, POLE, PTEN, RAD50, RAD51C, RAD51D, RNF43, SDHB, SDHC, SDHD, SMAD4, SMARCA4. STK11, TP53, TSC1, TSC2, and VHL.  The following genes were evaluated for sequence changes only: SDHA and HOXB13 c.251G>A variant only. The test report will be scanned into EPIC and located under the Molecular Pathology section of the Results Review tab.  A portion of the result report is included below for reference.     We discussed with [name] that because current genetic testing is not perfect, it is possible there may be a gene mutation in one of these genes that current testing cannot detect, but that chance is small.  We also discussed, that there could be another gene that has not yet been discovered, or that we have not yet tested, that is responsible for the cancer diagnoses in the family. It is also possible there is a hereditary cause for the cancer in the family that [name] did not inherit and therefore was not identified in her testing.  Therefore, it is important to remain in touch with cancer genetics in the future so that we can continue to offer [name] the most up to date genetic testing.   CANCER SCREENING RECOMMENDATIONS: [name] test result is considered negative (normal).  This means that we have not  identified a hereditary cause for her personal and family history of cancer at this time. Most cancers happen by chance and this negative test suggests that her personal and family of cancer may fall into this category.    While reassuring, this does not definitively rule out a hereditary predisposition to cancer. It is still possible that there could be genetic mutations that are undetectable by current technology. There could be genetic mutations in genes that have not been tested or identified to increase cancer risk.  Therefore, it is recommended she continue to follow the cancer management and screening guidelines provided by her oncology and primary healthcare providers.   An individual's cancer risk and medical management are not determined by genetic test results alone. Overall cancer risk assessment incorporates additional factors, including personal medical history, family history, and any available genetic information that may result in a personalized plan for cancer prevention and surveillance.  RECOMMENDATIONS FOR FAMILY MEMBERS:  Individuals in this family might be at some increased risk of developing cancer, over the general population risk, simply due to the family history of cancer.  We recommended women in this family have a yearly mammogram beginning at age 67, or 64 years younger than the earliest onset of cancer, an annual clinical breast exam, and perform monthly breast self-exams. Women in this family should also have a gynecological exam as recommended by their primary provider. All family  members should have a colonoscopy by age 66.  FOLLOW-UP: Lastly, we discussed with [name] that cancer genetics is a rapidly advancing field and it is possible that new genetic tests will be appropriate for her and/or her family members in the future. We encouraged her to remain in contact with cancer genetics on an annual basis so we can update her personal and family histories and let her know  of advances in cancer genetics that may benefit this family.   Our contact number was provided. [name] questions were answered to her satisfaction, and she knows she is welcome to call us at anytime with additional questions or concerns.   Clint Guy, [place], [facility] Licensed, Certified Dispensing [email or web address] Phone: [phone]

## 2020-03-26 NOTE — Telephone Encounter (Signed)
Revealed negative genetic testing.  Discussed that we do not know why she has breast cancer or why there is cancer in the family.  It is possible that there could be a mutation in a different gene that we are not testing, or our current technology may not be able detect certain mutations.  It will therefore be important for her to keep in contact with genetics to keep up with whether additional testing may be appropriate in the future.

## 2020-03-27 ENCOUNTER — Other Ambulatory Visit: Payer: Self-pay

## 2020-03-27 ENCOUNTER — Encounter (HOSPITAL_BASED_OUTPATIENT_CLINIC_OR_DEPARTMENT_OTHER): Payer: Self-pay | Admitting: Surgery

## 2020-03-27 NOTE — Progress Notes (Signed)
Patient had critical glucose level of 637 on 03/07/20. Dr. Jana Hakim, Oncologist advised patient to follow up with PCP for diabetes management, (per office visit note from 03/04/20- see EMR).  Patient has not yet followed up with PCP. Chart reviewed by Dr. Daiva Huge, anesthesiologist. Per Dr. Daiva Huge, patient must be seen and get surgical clearance from PCP prior to procedure at [facility]. Abigail Butts, at Dr. Trevor Mace office notified.

## 2020-03-29 ENCOUNTER — Encounter (HOSPITAL_BASED_OUTPATIENT_CLINIC_OR_DEPARTMENT_OTHER)
Admission: RE | Admit: 2020-03-29 | Discharge: 2020-03-29 | Disposition: A | Discharge status: Home / Self Care | Payer: 59 | Source: Ambulatory Visit | Attending: Surgery | Admitting: Surgery

## 2020-03-29 DIAGNOSIS — Z01812 Encounter for preprocedural laboratory examination: Secondary | ICD-10-CM | POA: Diagnosis present

## 2020-03-29 LAB — BASIC METABOLIC PANEL
Anion gap: 12 (ref 5–15)
BUN: 11 mg/dL (ref 6–20)
CO2: 27 mmol/L (ref 22–32)
Calcium: 9.7 mg/dL (ref 8.9–10.3)
Chloride: 97 mmol/L — ABNORMAL LOW (ref 98–111)
Creatinine, Ser: 0.5 mg/dL (ref 0.44–1.00)
GFR calc Af Amer: >60 mL/min (ref >60)
GFR calc non Af Amer: >60 mL/min (ref >60)
Glucose, Bld: 276 mg/dL — ABNORMAL HIGH (ref 70–99)
Potassium: 5.1 mmol/L (ref 3.5–5.1)
Sodium: 136 mmol/L (ref 135–145)

## 2020-03-29 LAB — POCT PREGNANCY, URINE: Preg Test, Ur: NEGATIVE

## 2020-03-29 MED ORDER — ENSURE PRE-SURGERY PO LIQD: 296.0000 mL | Freq: Once | ORAL | Status: DC

## 2020-03-29 NOTE — Progress Notes (Signed)

## 2020-03-30 ENCOUNTER — Encounter: Payer: Self-pay | Admitting: *Deleted

## 2020-03-30 NOTE — Progress Notes (Signed)
Notified pt of glucose-276, per pt's request.

## 2020-03-31 ENCOUNTER — Other Ambulatory Visit (HOSPITAL_COMMUNITY)
Admission: RE | Admit: 2020-03-31 | Discharge: 2020-03-31 | Disposition: A | Discharge status: Home / Self Care | Payer: 59 | Source: Ambulatory Visit | Attending: Surgery | Admitting: Surgery

## 2020-03-31 DIAGNOSIS — Z01812 Encounter for preprocedural laboratory examination: Secondary | ICD-10-CM | POA: Diagnosis not present

## 2020-03-31 DIAGNOSIS — Z20822 Contact with and (suspected) exposure to covid-19: Secondary | ICD-10-CM | POA: Diagnosis not present

## 2020-03-31 LAB — SARS CORONAVIRUS 2 (TAT 6-24 HRS): SARS Coronavirus 2: NEGATIVE

## 2020-04-03 ENCOUNTER — Other Ambulatory Visit: Payer: Self-pay

## 2020-04-03 ENCOUNTER — Ambulatory Visit
Admission: RE | Admit: 2020-04-03 | Discharge: 2020-04-03 | Disposition: A | Discharge status: Home / Self Care | Payer: 59 | Source: Ambulatory Visit | Attending: Surgery | Admitting: Surgery

## 2020-04-03 DIAGNOSIS — Z853 Personal history of malignant neoplasm of breast: Secondary | ICD-10-CM

## 2020-04-03 NOTE — H&P (Signed)
  Francesco Sor [place] Documented: 03/07/2020 7:23 AM Location: [facility] Patient #: [id number] DOB: 10-12-1976 Undefined / Language: Cleophus Molt / Race: White Female   History of Present Illness Nathaneil Canary A. Ninfa Linden MD; 03/07/2020 11:37 AM) The patient is a 44 year old female who presents with breast cancer.  chief complaint: right breast cancer   This is a pleasant 44 year old female who was found to have a distortion of recent screening mammography. She underwent a biopsy of this showing an invasive right breast cancer. She subsequently underwent a biopsy of another small mass which was a fibroadenoma. The architectural distortion is in the inner lower quadrant of the right breast. She has no previous history of breast cancer. She denies nipple discharge. She is otherwise without complaints   Medication History Tawni Pummel, RN; 03/07/2020 7:23 AM) Medications Reconciled    Physical Exam (Malachi Kinzler A. Ninfa Linden MD; 03/07/2020 11:38 AM) The physical exam findings are as follows: Note:She appears well on exam.  There is some ecchymosis on the right breast but no palpable breast mass. The nipple areolar complex is normal. There is no axillary adenopathy.    Assessment & Plan (Neala Miggins A. Ninfa Linden MD; 03/07/2020 11:39 AM) BREAST CANCER, STAGE 1, RIGHT (C50.911) Impression: The patient has been presented in the multidisciplinary breast clinic. I have reviewed her mammograms, ultrasound, pathology results. I discussion with the patient and her husband regarding the diagnosis. We discussed multiple treatment options. She is interested in breast conservation. We discussed proceeding with a radioactive seed guided right breast lumpectomy and sentinel lymph node biopsy. I discussed procedures with him in detail. I discussed the risk which includes but is not limited to bleeding, infection, injury to surrounding structures, the need for further surgery if margins are positive, lymphedema,  cardiopulmonary issues, postoperative recovery, etc. At this point she wishes to proceed with surgery which will be scheduled.

## 2020-04-04 ENCOUNTER — Ambulatory Visit (HOSPITAL_COMMUNITY)
Admission: RE | Admit: 2020-04-04 | Discharge: 2020-04-04 | Disposition: A | Discharge status: Home / Self Care | Payer: 59 | Source: Ambulatory Visit | Attending: Surgery | Admitting: Surgery

## 2020-04-04 ENCOUNTER — Encounter (HOSPITAL_BASED_OUTPATIENT_CLINIC_OR_DEPARTMENT_OTHER): Payer: Self-pay | Admitting: Surgery

## 2020-04-04 ENCOUNTER — Encounter (HOSPITAL_BASED_OUTPATIENT_CLINIC_OR_DEPARTMENT_OTHER)
Admission: RE | Disposition: A | Discharge status: Home / Self Care | Payer: Self-pay | Source: Home / Self Care | Attending: Surgery

## 2020-04-04 ENCOUNTER — Ambulatory Visit (HOSPITAL_BASED_OUTPATIENT_CLINIC_OR_DEPARTMENT_OTHER)
Admission: RE | Admit: 2020-04-04 | Discharge: 2020-04-04 | Disposition: A | Discharge status: Home / Self Care | Payer: 59 | Attending: Surgery | Admitting: Surgery

## 2020-04-04 ENCOUNTER — Ambulatory Visit (HOSPITAL_BASED_OUTPATIENT_CLINIC_OR_DEPARTMENT_OTHER): Payer: 59 | Admitting: Anesthesiology

## 2020-04-04 ENCOUNTER — Ambulatory Visit
Admission: RE | Admit: 2020-04-04 | Discharge: 2020-04-04 | Disposition: A | Discharge status: Home / Self Care | Payer: 59 | Source: Ambulatory Visit | Attending: Surgery | Admitting: Surgery

## 2020-04-04 ENCOUNTER — Other Ambulatory Visit: Payer: Self-pay

## 2020-04-04 DIAGNOSIS — Z170 Estrogen receptor positive status [ER+]: Secondary | ICD-10-CM | POA: Insufficient documentation

## 2020-04-04 DIAGNOSIS — C50311 Malignant neoplasm of lower-inner quadrant of right female breast: Secondary | ICD-10-CM | POA: Diagnosis not present

## 2020-04-04 DIAGNOSIS — Z853 Personal history of malignant neoplasm of breast: Secondary | ICD-10-CM

## 2020-04-04 DIAGNOSIS — Z87891 Personal history of nicotine dependence: Secondary | ICD-10-CM | POA: Insufficient documentation

## 2020-04-04 DIAGNOSIS — C50911 Malignant neoplasm of unspecified site of right female breast: Secondary | ICD-10-CM | POA: Diagnosis present

## 2020-04-04 HISTORY — DX: Nausea with vomiting, unspecified: R11.2

## 2020-04-04 HISTORY — DX: Other specified postprocedural states: Z98.890

## 2020-04-04 HISTORY — DX: Other complications of anesthesia, initial encounter: T88.59XA

## 2020-04-04 HISTORY — PX: BREAST LUMPECTOMY: SHX2

## 2020-04-04 HISTORY — PX: BREAST LUMPECTOMY WITH RADIOACTIVE SEED AND SENTINEL LYMPH NODE BIOPSY: SHX6550

## 2020-04-04 LAB — GLUCOSE, CAPILLARY
Glucose-Capillary: 245 mg/dL — ABNORMAL HIGH (ref 70–99)
Glucose-Capillary: 241 mg/dL — ABNORMAL HIGH (ref 70–99)

## 2020-04-04 SURGERY — BREAST LUMPECTOMY WITH RADIOACTIVE SEED AND SENTINEL LYMPH NODE BIOPSY
Anesthesia: General | Site: Breast | Laterality: Right

## 2020-04-04 MED ORDER — CELECOXIB 200 MG PO CAPS
ORAL_CAPSULE | ORAL | Status: AC
  Filled 2020-04-04: qty 1

## 2020-04-04 MED ORDER — FENTANYL CITRATE (PF) 100 MCG/2ML IJ SOLN
INTRAMUSCULAR | Status: AC
  Filled 2020-04-04: qty 2

## 2020-04-04 MED ORDER — ONDANSETRON HCL 4 MG/2ML IJ SOLN
INTRAMUSCULAR | Status: DC | PRN
  Administered 2020-04-04: 4 mg via INTRAVENOUS

## 2020-04-04 MED ORDER — GABAPENTIN 300 MG PO CAPS
ORAL_CAPSULE | ORAL | Status: AC
  Filled 2020-04-04: qty 1

## 2020-04-04 MED ORDER — CHLORHEXIDINE GLUCONATE CLOTH 2 % EX PADS: 6.0000 | MEDICATED_PAD | Freq: Once | CUTANEOUS | Status: DC

## 2020-04-04 MED ORDER — FENTANYL CITRATE (PF) 100 MCG/2ML IJ SOLN
50.0000 ug | INTRAMUSCULAR | Status: AC | PRN
  Administered 2020-04-04: 50 ug via INTRAVENOUS
  Administered 2020-04-04: 13:00:00 100 ug via INTRAVENOUS
  Administered 2020-04-04: 50 ug via INTRAVENOUS

## 2020-04-04 MED ORDER — FENTANYL CITRATE (PF) 100 MCG/2ML IJ SOLN: 25.0000 ug | INTRAMUSCULAR | Status: DC | PRN

## 2020-04-04 MED ORDER — SODIUM CHLORIDE (PF) 0.9 % IJ SOLN
INTRAMUSCULAR | Status: AC
  Filled 2020-04-04: qty 10

## 2020-04-04 MED ORDER — MEPERIDINE HCL 25 MG/ML IJ SOLN: 6.2500 mg | INTRAMUSCULAR | Status: DC | PRN

## 2020-04-04 MED ORDER — ACETAMINOPHEN 325 MG PO TABS: 325.0000 mg | ORAL_TABLET | ORAL | Status: DC | PRN

## 2020-04-04 MED ORDER — BUPIVACAINE HCL (PF) 0.5 % IJ SOLN
INTRAMUSCULAR | Status: AC
  Filled 2020-04-04: qty 60

## 2020-04-04 MED ORDER — PROPOFOL 10 MG/ML IV BOLUS
INTRAVENOUS | Status: DC | PRN
  Administered 2020-04-04: 200 mg via INTRAVENOUS

## 2020-04-04 MED ORDER — DEXAMETHASONE SODIUM PHOSPHATE 4 MG/ML IJ SOLN
INTRAMUSCULAR | Status: DC | PRN
  Administered 2020-04-04: 4 mg via INTRAVENOUS

## 2020-04-04 MED ORDER — ACETAMINOPHEN 500 MG PO TABS
1000.0000 mg | ORAL_TABLET | ORAL | Status: AC
  Administered 2020-04-04: 1000 mg via ORAL

## 2020-04-04 MED ORDER — ACETAMINOPHEN 500 MG PO TABS
ORAL_TABLET | ORAL | Status: AC
  Filled 2020-04-04: qty 2

## 2020-04-04 MED ORDER — LIDOCAINE HCL (CARDIAC) PF 100 MG/5ML IV SOSY
PREFILLED_SYRINGE | INTRAVENOUS | Status: DC | PRN
  Administered 2020-04-04: 100 mg via INTRAVENOUS

## 2020-04-04 MED ORDER — GABAPENTIN 300 MG PO CAPS
300.0000 mg | ORAL_CAPSULE | ORAL | Status: AC
  Administered 2020-04-04: 300 mg via ORAL

## 2020-04-04 MED ORDER — OXYCODONE HCL 5 MG/5ML PO SOLN: 5.0000 mg | Freq: Once | ORAL | Status: DC | PRN

## 2020-04-04 MED ORDER — ONDANSETRON HCL 4 MG/2ML IJ SOLN: 4.0000 mg | Freq: Once | INTRAMUSCULAR | Status: DC | PRN

## 2020-04-04 MED ORDER — METHYLENE BLUE 0.5 % INJ SOLN
INTRAVENOUS | Status: AC
  Filled 2020-04-04: qty 10

## 2020-04-04 MED ORDER — TECHNETIUM TC 99M SULFUR COLLOID FILTERED
1.0000 | Freq: Once | INTRAVENOUS | Status: AC | PRN
  Administered 2020-04-04: 1 via INTRADERMAL

## 2020-04-04 MED ORDER — LACTATED RINGERS IV SOLN: INTRAVENOUS | Status: DC

## 2020-04-04 MED ORDER — ROPIVACAINE HCL 7.5 MG/ML IJ SOLN
INTRAMUSCULAR | Status: DC | PRN
  Administered 2020-04-04: 30 mL via PERINEURAL

## 2020-04-04 MED ORDER — ACETAMINOPHEN 160 MG/5ML PO SOLN: 325.0000 mg | ORAL | Status: DC | PRN

## 2020-04-04 MED ORDER — CEFAZOLIN SODIUM-DEXTROSE 2-4 GM/100ML-% IV SOLN
INTRAVENOUS | Status: AC
  Filled 2020-04-04: qty 100

## 2020-04-04 MED ORDER — MIDAZOLAM HCL 2 MG/2ML IJ SOLN
1.0000 mg | INTRAMUSCULAR | Status: DC | PRN
  Administered 2020-04-04 (×2): 2 mg via INTRAVENOUS

## 2020-04-04 MED ORDER — OXYCODONE HCL 5 MG PO TABS: 5.0000 mg | ORAL_TABLET | Freq: Once | ORAL | Status: DC | PRN

## 2020-04-04 MED ORDER — MIDAZOLAM HCL 2 MG/2ML IJ SOLN
INTRAMUSCULAR | Status: AC
  Filled 2020-04-04: qty 2

## 2020-04-04 MED ORDER — HYDROCODONE-ACETAMINOPHEN 5-325 MG PO TABS: 1.0000 | ORAL_TABLET | Freq: Four times a day (QID) | ORAL | 0 refills | Status: DC | PRN

## 2020-04-04 MED ORDER — CELECOXIB 200 MG PO CAPS
400.0000 mg | ORAL_CAPSULE | ORAL | Status: AC
  Administered 2020-04-04: 400 mg via ORAL

## 2020-04-04 MED ORDER — SCOPOLAMINE 1 MG/3DAYS TD PT72
1.0000 | MEDICATED_PATCH | TRANSDERMAL | Status: DC
  Administered 2020-04-04: 1.5 mg via TRANSDERMAL

## 2020-04-04 MED ORDER — DIPHENHYDRAMINE HCL 50 MG/ML IJ SOLN
INTRAMUSCULAR | Status: DC | PRN
  Administered 2020-04-04: 12.5 mg via INTRAVENOUS

## 2020-04-04 MED ORDER — CLONIDINE HCL (ANALGESIA) 100 MCG/ML EP SOLN
EPIDURAL | Status: DC | PRN
  Administered 2020-04-04: 100 ug

## 2020-04-04 MED ORDER — CEFAZOLIN SODIUM-DEXTROSE 2-4 GM/100ML-% IV SOLN
2.0000 g | INTRAVENOUS | Status: AC
  Administered 2020-04-04: 2 g via INTRAVENOUS

## 2020-04-04 MED ORDER — BUPIVACAINE HCL (PF) 0.5 % IJ SOLN
INTRAMUSCULAR | Status: DC | PRN
  Administered 2020-04-04: 15 mL

## 2020-04-04 SURGICAL SUPPLY — 51 items
ADH SKN CLS APL DERMABOND .7 (GAUZE/BANDAGES/DRESSINGS) ×1
APL PRP STRL LF DISP 70% ISPRP (MISCELLANEOUS) ×1
APPLIER CLIP 9.375 MED OPEN (MISCELLANEOUS) ×2
APR CLP MED 9.3 20 MLT OPN (MISCELLANEOUS) ×1
BINDER BREAST 3XL (GAUZE/BANDAGES/DRESSINGS) ×1 IMPLANT
BLADE SURG 15 STRL LF DISP TIS (BLADE) ×1 IMPLANT
BLADE SURG 15 STRL SS (BLADE) ×2
CANISTER SUCT 1200ML W/VALVE (MISCELLANEOUS) IMPLANT
CHLORAPREP W/TINT 26 (MISCELLANEOUS) ×2 IMPLANT
CLIP APPLIE 9.375 MED OPEN (MISCELLANEOUS) ×1 IMPLANT
CLIP VESOCCLUDE SM WIDE 6/CT (CLIP) IMPLANT
COVER BACK TABLE 60X90IN (DRAPES) ×2 IMPLANT
COVER MAYO STAND STRL (DRAPES) ×2 IMPLANT
COVER PROBE W GEL 5X96 (DRAPES) ×2 IMPLANT
COVER WAND RF STERILE (DRAPES) IMPLANT
DECANTER SPIKE VIAL GLASS SM (MISCELLANEOUS) IMPLANT
DERMABOND ADVANCED (GAUZE/BANDAGES/DRESSINGS) ×1
DERMABOND ADVANCED .7 DNX12 (GAUZE/BANDAGES/DRESSINGS) ×1 IMPLANT
DRAPE LAPAROSCOPIC ABDOMINAL (DRAPES) ×2 IMPLANT
DRAPE UTILITY XL STRL (DRAPES) ×2 IMPLANT
DRSG PAD ABDOMINAL 8X10 ST (GAUZE/BANDAGES/DRESSINGS) ×1 IMPLANT
ELECT REM PT RETURN 9FT ADLT (ELECTROSURGICAL) ×2
ELECTRODE REM PT RTRN 9FT ADLT (ELECTROSURGICAL) ×1 IMPLANT
GAUZE SPONGE 4X4 12PLY STRL LF (GAUZE/BANDAGES/DRESSINGS) IMPLANT
GLOVE BIO SURGEON STRL SZ 6.5 (GLOVE) ×1 IMPLANT
GLOVE BIOGEL PI IND STRL 7.0 (GLOVE) IMPLANT
GLOVE BIOGEL PI INDICATOR 7.0 (GLOVE) ×2
GLOVE SURG SIGNA 7.5 PF LTX (GLOVE) ×2 IMPLANT
GOWN STRL REUS W/ TWL LRG LVL3 (GOWN DISPOSABLE) ×1 IMPLANT
GOWN STRL REUS W/ TWL XL LVL3 (GOWN DISPOSABLE) ×1 IMPLANT
GOWN STRL REUS W/TWL LRG LVL3 (GOWN DISPOSABLE) ×2
GOWN STRL REUS W/TWL XL LVL3 (GOWN DISPOSABLE) ×2
KIT MARKER MARGIN INK (KITS) ×2 IMPLANT
NDL HYPO 25X1 1.5 SAFETY (NEEDLE) ×1 IMPLANT
NDL SAFETY ECLIPSE 18X1.5 (NEEDLE) ×1 IMPLANT
NEEDLE HYPO 18GX1.5 SHARP (NEEDLE)
NEEDLE HYPO 25X1 1.5 SAFETY (NEEDLE) ×2 IMPLANT
NS IRRIG 1000ML POUR BTL (IV SOLUTION) ×2 IMPLANT
PACK BASIN DAY SURGERY FS (CUSTOM PROCEDURE TRAY) ×2 IMPLANT
PENCIL SMOKE EVACUATOR (MISCELLANEOUS) ×2 IMPLANT
SLEEVE SCD COMPRESS KNEE MED (MISCELLANEOUS) ×2 IMPLANT
SPONGE LAP 4X18 RFD (DISPOSABLE) ×2 IMPLANT
SUT MNCRL AB 4-0 PS2 18 (SUTURE) ×2 IMPLANT
SUT SILK 2 0 SH (SUTURE) IMPLANT
SUT VIC AB 3-0 SH 27 (SUTURE) ×4
SUT VIC AB 3-0 SH 27X BRD (SUTURE) ×1 IMPLANT
SYR CONTROL 10ML LL (SYRINGE) ×2 IMPLANT
TOWEL GREEN STERILE FF (TOWEL DISPOSABLE) ×2 IMPLANT
TRAY FAXITRON CT DISP (TRAY / TRAY PROCEDURE) ×2 IMPLANT
TUBE CONNECTING 20X1/4 (TUBING) IMPLANT
YANKAUER SUCT BULB TIP NO VENT (SUCTIONS) IMPLANT

## 2020-04-04 NOTE — Anesthesia Postprocedure Evaluation (Signed)
Anesthesia Post Note  Patient: [name]  Procedure(s) Performed: RIGHT BREAST LUMPECTOMY WITH RADIOACTIVE SEED AND SENTINEL LYMPH NODE BIOPSY (Right Breast)     Patient location during evaluation: PACU Anesthesia Type: General Level of consciousness: awake and alert Pain management: pain level controlled Vital Signs Assessment: post-procedure vital signs reviewed and stable Respiratory status: spontaneous breathing, nonlabored ventilation, respiratory function stable and patient connected to nasal cannula oxygen Cardiovascular status: blood pressure returned to baseline and stable Postop Assessment: no apparent nausea or vomiting Anesthetic complications: no    Last Vitals:  Vitals:   04/04/20 1453 04/04/20 1521  BP: 123/84 122/83  Pulse: 86 82  Resp: 18 18  Temp:  36.6 C  SpO2: 93% 99%    Last Pain:  Vitals:   04/04/20 1521  TempSrc:   PainSc: 0-No pain                 Jessamyn Watterson

## 2020-04-04 NOTE — Anesthesia Preprocedure Evaluation (Signed)
Anesthesia Evaluation  Patient identified by MRN, date of birth, ID band Patient awake    Reviewed: Allergy & Precautions, H&P , NPO status , Patient's Chart, lab work & pertinent test results, reviewed documented beta blocker date and time   History of Anesthesia Complications (+) PONV and history of anesthetic complications  Airway Mallampati: II  TM Distance: >3 FB Neck ROM: full    Dental no notable dental hx.    Pulmonary neg pulmonary ROS, former smoker,    Pulmonary exam normal breath sounds clear to auscultation       Cardiovascular Exercise Tolerance: Good negative cardio ROS   Rhythm:regular Rate:Normal     Neuro/Psych negative neurological ROS  negative psych ROS   GI/Hepatic negative GI ROS, Neg liver ROS,   Endo/Other  negative endocrine ROSdiabetes  Renal/GU negative Renal ROS  negative genitourinary   Musculoskeletal   Abdominal   Peds  Hematology negative hematology ROS (+)   Anesthesia Other Findings   Reproductive/Obstetrics negative OB ROS                             Anesthesia Physical Anesthesia Plan  ASA: III  Anesthesia Plan: General   Post-op Pain Management: GA combined w/ Regional for post-op pain   Induction:   PONV Risk Score and Plan: 4 or greater and Ondansetron, Dexamethasone, Scopolamine patch - Pre-op, Midazolam and Treatment may vary due to age or medical condition  Airway Management Planned: LMA and Oral ETT  Additional Equipment:   Intra-op Plan:   Post-operative Plan: Extubation in OR  Informed Consent: I have reviewed the patients History and Physical, chart, labs and discussed the procedure including the risks, benefits and alternatives for the proposed anesthesia with the patient or authorized representative who has indicated his/her understanding and acceptance.     Dental Advisory Given  Plan Discussed with: CRNA,  Anesthesiologist and Surgeon  Anesthesia Plan Comments: (Discussed both nerve block for pain relief post-op and GA; including NV, sore throat, dental injury, and pulmonary complications)        Anesthesia Quick Evaluation

## 2020-04-04 NOTE — Progress Notes (Signed)
Assisted Dr. Oddono with right, ultrasound guided, pectoralis block. Side rails up, monitors on throughout procedure. See vital signs in flow sheet. Tolerated Procedure well. 

## 2020-04-04 NOTE — Anesthesia Procedure Notes (Signed)
Procedure Name: LMA Insertion Date/Time: 04/04/2020 1:26 PM Performed by: Willa Frater, CRNA Pre-anesthesia Checklist: Patient identified, Emergency Drugs available, Suction available and Patient being monitored Patient Re-evaluated:Patient Re-evaluated prior to induction Oxygen Delivery Method: Circle system utilized Preoxygenation: Pre-oxygenation with 100% oxygen Induction Type: IV induction Ventilation: Mask ventilation without difficulty LMA: LMA inserted LMA Size: 5.0 Number of attempts: 1 Airway Equipment and Method: Bite block Placement Confirmation: positive ETCO2 Tube secured with: Tape Dental Injury: Teeth and Oropharynx as per pre-operative assessment

## 2020-04-04 NOTE — Anesthesia Procedure Notes (Signed)
Anesthesia Regional Block: Pectoralis block   Pre-Anesthetic Checklist: ,, timeout performed, Correct Patient, Correct Site, Correct Laterality, Correct Procedure, Correct Position, site marked, Risks and benefits discussed,  Surgical consent,  Pre-op evaluation,  At surgeon's request and post-op pain management  Laterality: Right  Prep: chloraprep       Needles:  Injection technique: Single-shot  Needle Type: Echogenic Stimulator Needle     Needle Length: 5cm  Needle Gauge: 22     Additional Needles:   Procedures:, nerve stimulator,,, ultrasound used (permanent image in chart),,,,  Narrative:  Start time: 04/04/2020 12:10 PM End time: 04/04/2020 12:20 PM Injection made incrementally with aspirations every 5 mL.  Performed by: Personally  Anesthesiologist: Janeece Riggers, MD  Additional Notes: Functioning IV was confirmed and monitors were applied.  A 63mm 22ga Arrow echogenic stimulator needle was used. Sterile prep and drape,hand hygiene and sterile gloves were used. Ultrasound guidance: relevant anatomy identified, needle position confirmed, local anesthetic spread visualized around nerve(s)., vascular puncture avoided.  Image printed for medical record. Negative aspiration and negative test dose prior to incremental administration of local anesthetic. The patient tolerated the procedure well.

## 2020-04-04 NOTE — Interval H&P Note (Signed)
History and Physical Interval Note: no change in H and P  04/04/2020 1:06 PM  [name]  has presented today for surgery, with the diagnosis of RIGHT BREAST CANCERS.  The various methods of treatment have been discussed with the patient and family. After consideration of risks, benefits and other options for treatment, the patient has consented to  Procedure(s): RIGHT BREAST LUMPECTOMY WITH RADIOACTIVE SEED AND SENTINEL LYMPH NODE BIOPSY (Right) as a surgical intervention.  The patient's history has been reviewed, patient examined, no change in status, stable for surgery.  I have reviewed the patient's chart and labs.  Questions were answered to the patient's satisfaction.     [name]

## 2020-04-04 NOTE — Op Note (Signed)
   [name] 04/04/2020   Pre-op Diagnosis: RIGHT BREAST CANCERS     Post-op Diagnosis: same  Procedure(s): RIGHT BREAST LUMPECTOMY WITH RADIOACTIVE SEED AND DEEP AXILLARY SENTINEL LYMPH NODE BIOPSY  Surgeon(s): [name], MD  Anesthesia: General  Staff:  Circulator: Ted Mcalpine, RN Scrub Person: Lorenza Burton, [place] Circulator Assistant: Izora Ribas, RN  Estimated Blood Loss: Minimal               Specimens: sent to path  Indications: This is a 44 year old female with a newly diagnosed stage I right breast cancer.  The decision was made to proceed with a right breast radioactive seed guided lumpectomy and sentinel lymph node biopsy  Procedure: The patient was brought to the operating room and identifies correct patient.  She is placed upon the operating table general anesthesia was induced.  Her right breast and axilla were prepped and draped in usual sterile fashion.  The radioactive seed was located with the neoprobe at the medial edge of the areola of the right breast.  I anesthetized the skin with Marcaine and made a circumareolar incision with a scalpel.  With the aid of neoprobe I then performed a wide lumpectomy staying around the seed using electrocautery.  I took the dissection all the way down to the chest wall.  I then completely remove the lumpectomy specimen.  As the seed was more towards the medial portion of the lumpectomy specimen I decided to excise an additional medial posterior margin going down to the chest wall.  This was sent separately to pathology.  An x-ray was performed on the lumpectomy specimen confirming the radioactive seed and previous marker in the specimen.  The margins have been marked pain.  It was then sent to pathology for evaluation.  I next identified an area of increased uptake in the right axilla with the neoprobe.  I made an incision with a scalpel after anesthetizing with Marcaine.  I then dissected down to the deep  axillary tissue.  There was extensive uptake of radioactive isotope in the axilla making it difficult to identify the nodes.  There was a reactive slight necrotic lymph node which I excised along with several pieces of surrounding fat that appeared to contain several sentinel nodes.  At this point the nodal basin appeared clean.  I could palpate no other lymph nodes.  Hemostasis was achieved with cautery.  I next placed surgical clips at the site of the lumpectomy.  I then closed both incisions with interrupted 3-0 Vicryl sutures and running 4-0 Monocryl sutures.  Dermabond and a breast binder were applied.  The patient tolerated procedure well.  All the counts were correct at the end of the procedure.  The patient was then extubated in the operating room and taken in a stable addition to the recovery room.          [name]   Date: 04/04/2020  Time: 2:10 PM

## 2020-04-04 NOTE — Progress Notes (Signed)
Nuc med inj performed by nuc med staff. Pt tol well with additional fentanyl for comfort. VSS. Emotional support provided.

## 2020-04-04 NOTE — Transfer of Care (Signed)
Immediate Anesthesia Transfer of Care Note  Patient: [name]  Procedure(s) Performed: RIGHT BREAST LUMPECTOMY WITH RADIOACTIVE SEED AND SENTINEL LYMPH NODE BIOPSY (Right Breast)  Patient Location: PACU  Anesthesia Type:GA combined with regional for post-op pain  Level of Consciousness: sedated  Airway & Oxygen Therapy: Patient Spontanous Breathing and Patient connected to face mask oxygen  Post-op Assessment: Report given to RN and Post -op Vital signs reviewed and stable  Post vital signs: Reviewed and stable  Last Vitals:  Vitals Value Taken Time  BP    Temp    Pulse 95 04/04/20 1417  Resp    SpO2 94 % 04/04/20 1417  Vitals shown include unvalidated device data.  Last Pain:  Vitals:   04/04/20 1129  TempSrc: Tympanic  PainSc: 0-No pain         Complications: No apparent anesthesia complications

## 2020-04-04 NOTE — Discharge Instructions (Signed)
No Tylenol until 5:45pm if needed. No Ibuprofen until 7:45pm if needed.     [place] Office Phone Number [phone]  BREAST BIOPSY/ PARTIAL MASTECTOMY: POST OP INSTRUCTIONS  Always review your discharge instruction sheet given to you by the facility where your surgery was performed.  IF YOU HAVE DISABILITY OR FAMILY LEAVE FORMS, YOU MUST BRING THEM TO THE OFFICE FOR PROCESSING.  DO NOT GIVE THEM TO YOUR DOCTOR.  1. A prescription for pain medication may be given to you upon discharge.  Take your pain medication as prescribed, if needed.  If narcotic pain medicine is not needed, then you may take acetaminophen (Tylenol) or ibuprofen (Advil) as needed. 2. Take your usually prescribed medications unless otherwise directed 3. If you need a refill on your pain medication, please contact your pharmacy.  They will contact our office to request authorization.  Prescriptions will not be filled after 5pm or on week-ends. 4. You should eat very light the first 24 hours after surgery, such as soup, crackers, pudding, etc.  Resume your normal diet the day after surgery. 5. Most patients will experience some swelling and bruising in the breast.  Ice packs and a good support bra will help.  Swelling and bruising can take several days to resolve.  6. It is common to experience some constipation if taking pain medication after surgery.  Increasing fluid intake and taking a stool softener will usually help or prevent this problem from occurring.  A mild laxative (Milk of Magnesia or Miralax) should be taken according to package directions if there are no bowel movements after 48 hours. 7. Unless discharge instructions indicate otherwise, you may remove your bandages 24-48 hours after surgery, and you may shower at that time.  You may have steri-strips (small skin tapes) in place directly over the incision.  These strips should be left on the skin for 7-10 days.  If your surgeon used skin glue  on the incision, you may shower in 24 hours.  The glue will flake off over the next 2-3 weeks.  Any sutures or staples will be removed at the office during your follow-up visit. 8. ACTIVITIES:  You may resume regular daily activities (gradually increasing) beginning the next day.  Wearing a good support bra or sports bra minimizes pain and swelling.  You may have sexual intercourse when it is comfortable. a. You may drive when you no longer are taking prescription pain medication, you can comfortably wear a seatbelt, and you can safely maneuver your car and apply brakes. b. RETURN TO WORK:  ______________________________________________________________________________________ 9. You should see your doctor in the office for a follow-up appointment approximately two weeks after your surgery.  Your doctor's nurse will typically make your follow-up appointment when she calls you with your pathology report.  Expect your pathology report 2-3 business days after your surgery.  You may call to check if you do not hear from [place] after three days. 10. OTHER INSTRUCTIONS:OK TO SHOWER STARTING TOMORROW 11. YOU MAY REMOVE THE BINDER IN THE MORNING, OR THIS EVENING IF IT IS UNCOMFORTABLE 12. ICE PACK, IBUPROFEN ALSO FOR PAIN 13. NO VIGOROUS ACTIVITY FOR ONE WEEK _______________________________________________________________________________________________ _____________________________________________________________________________________________________________________________________ _____________________________________________________________________________________________________________________________________ _____________________________________________________________________________________________________________________________________  WHEN TO CALL YOUR DOCTOR: 1. Fever over 101.0 2. Nausea and/or vomiting. 3. Extreme swelling or bruising. 4. Continued bleeding from incision. 5. Increased pain,  redness, or drainage from the incision.  The clinic staff is available to answer your questions during regular business hours.  Please don't hesitate to call  and ask to speak to one of the nurses for clinical concerns.  If you have a medical emergency, go to the nearest emergency room or call 911.  A surgeon from [facility] is always on call at the hospital.  For further questions, please visit [email or web address]     Post Anesthesia Home Care Instructions  Activity: Get plenty of rest for the remainder of the day. A responsible individual must stay with you for 24 hours following the procedure.  For the next 24 hours, DO NOT: -Drive a car -Paediatric nurse -Drink alcoholic beverages -Take any medication unless instructed by your physician -Make any legal decisions or sign important papers.  Meals: Start with liquid foods such as gelatin or soup. Progress to regular foods as tolerated. Avoid greasy, spicy, heavy foods. If nausea and/or vomiting occur, drink only clear liquids until the nausea and/or vomiting subsides. Call your physician if vomiting continues.  Special Instructions/Symptoms: Your throat may feel dry or sore from the anesthesia or the breathing tube placed in your throat during surgery. If this causes discomfort, gargle with warm salt water. The discomfort should disappear within 24 hours.  If you had a scopolamine patch placed behind your ear for the management of post- operative nausea and/or vomiting:  1. The medication in the patch is effective for 72 hours, after which it should be removed.  Wrap patch in a tissue and discard in the trash. Wash hands thoroughly with soap and water. 2. You may remove the patch earlier than 72 hours if you experience unpleasant side effects which may include dry mouth, dizziness or visual disturbances. 3. Avoid touching the patch. Wash your hands with soap and water after contact with the patch.

## 2020-04-05 ENCOUNTER — Encounter: Payer: Self-pay | Admitting: *Deleted

## 2020-04-06 LAB — SURGICAL PATHOLOGY

## 2020-04-09 ENCOUNTER — Encounter: Payer: Self-pay | Admitting: *Deleted

## 2020-04-17 NOTE — Progress Notes (Signed)
[place]  [phone] [phone]     ID: [name] DOB: November 11, 1976  MR#: [id number]  PIR#:[id number]  Patient Care Team: Precious Gilding, [place] as PCP - General (Physician Assistant) System, Provider Not In Thurnell Lose, MD as Consulting Physician (Obstetrics and Gynecology) Mauro Kaufmann, RN as Oncology Nurse Navigator Rockwell Germany, RN as Oncology Nurse Navigator [name], MD as Consulting Physician (General Surgery) Georg Ang, Virgie Dad, MD as Consulting Physician (Oncology) Kyung Rudd, MD as Consulting Physician (Radiation Oncology) Chauncey Cruel, MD OTHER MD: Pauline Good, NP (psychiatry)  CHIEF COMPLAINT: estrogen receptor positive breast cancer  CURRENT TREATMENT: Adjuvant radiation pending   INTERVAL HISTORY: [name] returns today for follow up of her estrogen receptor positive breast cancer accompanied by her husband [name]. She was evaluated in the multidisciplinary breast cancer clinic on 03/07/2020.  Her genetic testing results were negative.   Mammaprint testing was performed on the original biopsy sample and showed low risk.  She opted to proceed with right lumpectomy on 04/04/2020 under Dr. Ninfa Linden. Pathology from the procedure ([id number]) showed: invasive ductal carcinoma, grade 1, 1.4 cm; ductal carcinoma in situ, intermediate grade; margins negative for carcinoma; small incidental intraductal papilloma.  All four biopsied lymph nodes were negative for carcinoma.   REVIEW OF SYSTEMS: [name] did well with her surgery.  She took a Norco for a few days but stopped earlier this week.  She has not had significant bleeding or fever.  She is doing rehab exercises for the right arm very successfully and has had no swelling.  She does have numbness in the right axilla.  She shaves with an Copy.  She is being extra careful regarding her diet and is bringing her blood sugar down successfully.  A detailed  review of systems today was otherwise stable    HISTORY OF CURRENT ILLNESS: From the original intake note:  "[name]" had routine screening mammography on 02/07/2019 showing a possible abnormality in the right breast. She underwent right diagnostic mammography with tomography and right breast ultrasonography at [facility] on 02/23/2019 showing: breast density category B; probably-benign 0.7 cm mass in right breast at 9 o'clock, likely a fibroadenoma. She returned for short-term right breast ultrasound on 08/29/2019, which showed stability of the probably-benign mass.  She returned for her annual mammography and underwent bilateral diagnostic mammography with tomography and right breast ultrasonography at [facility] on 02/27/2020 showing: breast density category B; new area of distortion in medial inferior right breast; this could not be demonstrated on ultrasound.  There was a stable probably-benign right breast mass at 9 o'clock; no evidence of left breast malignancy or lymphadenopathy.  Accordingly on 03/01/2020 she proceeded to biopsy of the right breast area in question. The pathology from this procedure ([id number]) showed: invasive ductal carcinoma, grade 1; adenosis with calcifications. Prognostic indicators significant for: estrogen receptor, 95% positive with moderate staining intensity and progesterone receptor, 95% positive with strong staining intensity. Proliferation marker Ki67 at 2%. HER2 negative by immunohistochemistry (1+).  Biopsy of the probably-benign mass was performed on 03/05/2020. 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  The patient's subsequent history is as detailed below.   PAST MEDICAL HISTORY: Past Medical History:  Diagnosis Date  . Bipolar 1 disorder ([place])   . Complication of anesthesia   . Diabetes mellitus type 2 in obese ([place])   . Family history of prostate cancer   . Obesity   . PONV (postoperative nausea and vomiting)   .  Seasonal  allergies     PAST SURGICAL HISTORY: Past Surgical History:  Procedure Laterality Date  . BREAST LUMPECTOMY WITH RADIOACTIVE SEED AND SENTINEL LYMPH NODE BIOPSY Right 04/04/2020   Procedure: RIGHT BREAST LUMPECTOMY WITH RADIOACTIVE SEED AND SENTINEL LYMPH NODE BIOPSY;  Surgeon: [name], MD;  Location: [facility];  Service: General;  Laterality: Right;  . OOPHORECTOMY  2009    FAMILY HISTORY: Family History  Problem Relation Age of Onset  . Heart disease Other   . Macular degeneration Other   . Mental illness Other        both sides of family  . Prostate cancer Paternal Uncle 66  . Immunodeficiency Maternal Aunt   . Dementia Maternal Grandmother   . Diabetes Maternal Aunt   Her father is age 8 and her mother 93 as of 02/2020.  The patient has 3 brothers and 1 sister. She reports prostate cancer in a paternal uncle at age 8.  There is a family history of ovarian cancer.  She denies a family history of breast or pancreatic cancer.   GYNECOLOGIC HISTORY:  Patient's last menstrual period was 03/19/2020. Menarche: 44 years old [place] P 0 LMP 02/2020, regular and lasts 5-7 days Contraceptive: used since 2009, will switch to IUD with cancer diagnosis HRT n/a  Hysterectomy? no BSO? 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 Husband Jeneen Rinks "[name]" is a Administrator.  At home is just the 2 of them plus a cast.  They recently moved to a townhouse with a lot of stairs and that is helping her exercise.  She is not a Designer, fashion/clothing.    ADVANCED DIRECTIVES: In the absence of any documentation to the contrary, the patient's spouse is their HCPOA.    HEALTH MAINTENANCE: Social History   Tobacco Use  . Smoking status: Former Research scientist (life sciences)  . Smokeless tobacco: Never Used  Substance Use Topics  . Alcohol use: Yes    Comment: social    . Drug use: No     Colonoscopy: n/a (age)  PAP: 12/2018, negative  Bone density: n/a (age)   No Known Allergies  Current Outpatient Medications  Medication Sig Dispense Refill  . ALPRAZolam (XANAX) 0.25 MG tablet Take 1 tablet (0.25 mg) two times daily as needed for anxiety 30 tablet 0  . benztropine (COGENTIN) 1 MG tablet Take 1 tablet (1 mg total) by mouth 2 (two) times daily. For prevention of drug induced involuntary movement 60 tablet 0  . divalproex (DEPAKOTE ER) 500 MG 24 hr tablet Take 1 tablet (500 mg total) by mouth 2 (two) times daily. For mood stabilization 60 tablet 0  . metFORMIN (GLUCOPHAGE) 500 MG tablet Take 1 tablet (500 mg total) by mouth 2 (two) times daily with a meal. For diabetes management 60 tablet 3  . OLANZapine zydis (ZYPREXA) 5 MG disintegrating tablet Take 0.5 tablets (2.5 mg total) by mouth daily. For mood control    . trihexyphenidyl (ARTANE) 5 MG tablet      No current facility-administered medications for this visit.    OBJECTIVE:  white woman in no acute distress  Vitals:   04/18/20 0854  BP: (!) 135/91  Pulse: 94  Resp: 17  Temp: 98.2 F (36.8 C)  SpO2: 98%     Body mass index is 42.5 kg/m.   Wt Readings from Last 3 Encounters:  04/18/20 247 lb 9.6 oz (112.3 kg)  04/04/20 245 lb 6 oz (111.3 kg)  03/07/20 250 lb 12.8 oz (113.8 kg)      ECOG FS:1 - Symptomatic but completely ambulatory  Sclerae unicteric, EOMs intact Wearing a mask No cervical or supraclavicular adenopathy Lungs no rales or rhonchi Heart regular rate and rhythm Abd soft, nontender, positive bowel sounds MSK no focal spinal tenderness, no upper extremity lymphedema Neuro: nonfocal, well oriented, appropriate affect Breasts: The right breast is status post lumpectomy.  The incision is healing very nicely.  The cosmetic result is excellent.  There is no evidence of residual or recurrent disease.  The left breast is benign.  Both axillae are benign.   LAB  RESULTS:  CMP     Component Value Date/Time   NA 136 04/18/2020 0805   K 4.1 04/18/2020 0805   CL 99 04/18/2020 0805   CO2 24 04/18/2020 0805   GLUCOSE 267 (H) 04/18/2020 0805   BUN 14 04/18/2020 0805   CREATININE 0.63 04/18/2020 0805   CREATININE 0.80 03/07/2020 0810   CALCIUM 9.5 04/18/2020 0805   PROT 7.8 04/18/2020 0805   ALBUMIN 3.7 04/18/2020 0805   AST 12 (L) 04/18/2020 0805   AST 14 (L) 03/07/2020 0810   ALT 15 04/18/2020 0805   ALT 12 03/07/2020 0810   ALKPHOS 68 04/18/2020 0805   BILITOT 0.3 04/18/2020 0805   BILITOT 0.3 03/07/2020 0810   GFRNONAA >60 04/18/2020 0805   GFRNONAA >60 03/07/2020 0810   GFRAA >60 04/18/2020 0805   GFRAA >60 03/07/2020 0810    No results found for: TOTALPROTELP, ALBUMINELP, A1GS, A2GS, BETS, BETA2SER, GAMS, MSPIKE, SPEI  Lab Results  Component Value Date   WBC 7.9 04/18/2020   NEUTROABS 5.2 04/18/2020   HGB 15.9 (H) 04/18/2020   HCT 47.5 (H) 04/18/2020   MCV 84.5 04/18/2020   PLT 340 04/18/2020    No results found for: LABCA2  No components found for: [id number]  No results for input(s): INR in the last 168 hours.  No results found for: LABCA2  No results found for: YIF027  No results found for: XAJ287  No results found for: OMV672  No results found for: CA2729  No components found for: HGQUANT  No results found for: CEA1 / No results found for: CEA1   No results found for: AFPTUMOR  No results found for: CHROMOGRNA  No results found for: KPAFRELGTCHN, LAMBDASER, KAPLAMBRATIO (kappa/lambda light chains)  No results found for: HGBA, HGBA2QUANT, HGBFQUANT, HGBSQUAN (Hemoglobinopathy evaluation)   No results found for: LDH  No results found for: IRON, TIBC, IRONPCTSAT (Iron and TIBC)  No results found for: FERRITIN  Urinalysis    Component Value Date/Time   COLORURINE YELLOW 08/11/2014 0654   APPEARANCEUR CLOUDY (A) 08/11/2014 0654   LABSPEC 1.004 (L) 08/11/2014 0654   PHURINE 7.0 08/11/2014 0654    GLUCOSEU NEGATIVE 08/11/2014 0654   HGBUR NEGATIVE 08/11/2014 0654   BILIRUBINUR NEGATIVE 08/11/2014 0654   KETONESUR NEGATIVE 08/11/2014 0654   PROTEINUR NEGATIVE 08/11/2014 0654   UROBILINOGEN 0.2 08/11/2014 0654   NITRITE NEGATIVE 08/11/2014 0654   LEUKOCYTESUR NEGATIVE 08/11/2014 0654     STUDIES: NM Sentinel Node Inj-No Rpt (Breast)  Result Date: 04/04/2020 Sulfur colloid was injected by the nuclear medicine technologist for melanoma sentinel node.   MM Breast Surgical Specimen  Result Date: 04/04/2020 CLINICAL DATA:  Evaluate specimen EXAM: SPECIMEN RADIOGRAPH OF THE RIGHT BREAST COMPARISON:  Previous exam(s). FINDINGS: Status post excision of the  right breast. The radioactive seed and biopsy marker clip are present, completely intact, and were marked for pathology. IMPRESSION: Specimen radiograph of the right breast. Electronically Signed   By: Dorise Bullion III M.D   On: 04/04/2020 13:53   MM RT RADIOACTIVE SEED LOC MAMMO GUIDE  Result Date: 04/03/2020 CLINICAL DATA:  44 year old with a biopsy-proven grade 1 invasive ductal carcinoma involving the INNER RIGHT breast at MIDDLE depth, slight UPPER INNER QUADRANT, associated with an X shaped clip. She had a recent benign biopsy of a mass in the OUTER RIGHT breast with pathology revealing fibroadenoma, associated with a ribbon shaped clip. EXAM: MAMMOGRAPHIC GUIDED RADIOACTIVE SEED LOCALIZATION OF THE RIGHT BREAST COMPARISON:  Previous exam(s). FINDINGS: Patient presents for radioactive seed localization prior to RIGHT breast lumpectomy. I met with the patient and we discussed the procedure of seed localization including benefits and alternatives. We discussed the high likelihood of a successful procedure. We discussed the risks of the procedure including infection, bleeding, tissue injury and further surgery. We discussed the low dose of radioactivity involved in the procedure. Informed, written consent was given. The usual time-out  protocol was performed immediately prior to the procedure. Using mammographic guidance, sterile technique, 1% lidocaine as local anesthetic, an I-125 radioactive seed was used to localize the X shaped tissue marker clip associated with the biopsy-proven invasive ductal carcinoma in the INNER RIGHT breast using a medial approach. The follow-up mammogram images confirm that the seed is in the expected location immediately adjacent to the X clip. The images are marked for Dr. Ninfa Linden. Follow-up survey of the patient confirms the presence of the radioactive seed. Order number of I-125 seed: [id number] Total activity: 0.246 mCi Reference Date: 03/22/2020 The patient tolerated the procedure well and was released from the [facility]. She was given instructions regarding seed removal. IMPRESSION: Radioactive seed localization of biopsy-proven invasive ductal carcinoma involving the INNER RIGHT breast. No apparent complications. Electronically Signed   By: Evangeline Dakin M.D.   On: 04/03/2020 14:07     ELIGIBLE FOR AVAILABLE RESEARCH PROTOCOL: AET  ASSESSMENT: 44 y.o. [place] woman status post right breast upper inner quadrant biopsy 03/01/2020 for a clinical TXN0, stage Ia invasive ductal carcinoma, grade 1, estrogen and progesterone receptor positive, HER-2 not amplified, with an MIB-1 of 2%.  (1) genetics testing pending  (a) Negative genetic testing:  No pathogenic variants detected on the [facility] Breast Cancer STAT Panel or Common Hereditary Cancers Panel. The report date is 03/25/2020.  The Breast Cancer STAT Panel offered by [facility] includes sequencing and deletion/duplication analysis for the following 9 genes:  ATM, BRCA1, BRCA2, CDH1, CHEK2, PALB2, PTEN, STK11 and TP53.  The Common Hereditary Cancers Panel offered by [facility] includes sequencing and/or deletion duplication testing of the following 48 genes: APC, ATM, AXIN2, BARD1, BMPR1A, BRCA1, BRCA2, BRIP1, CDH1, CDK4, CDKN2A (p14ARF), CDKN2A  (p16INK4a), CHEK2, CTNNA1, DICER1, EPCAM (Deletion/duplication testing only), GREM1 (promoter region deletion/duplication testing only), KIT, MEN1, MLH1, MSH2, MSH3, MSH6, MUTYH, NBN, NF1, NHTL1, PALB2, PDGFRA, PMS2, POLD1, POLE, PTEN, RAD50, RAD51C, RAD51D, RNF43, SDHB, SDHC, SDHD, SMAD4, SMARCA4. STK11, TP53, TSC1, TSC2, and VHL.  The following genes were evaluated for sequence changes only: SDHA and HOXB13 c.251G>A variant only.  (2) MammaPrint to be obtained from the original breast biopsy read as molecular "low risk", clinical low risk  (3) status post right lumpectomy and sentinel lymph node sampling 04/04/2020 for a pT1c pN0, stage IA invasive ductal carcinoma, grade 1, with negative margins  (a) a total of  4 right axillary lymph nodes were removed.  (4) adjuvant radiation pending  (5) antiestrogens to follow  (6) type 2 diabetes mellitus   PLAN: Lakiyah did well with her surgery and she is scheduled to meet with radiation shortly and likely will start radiation next week.  We reviewed her pathology report which is very favorable.  We then reviewed her MammaPrint report which is also very good.  She is in the clinical low risk and metabolic low risk group.  That group does very well without chemotherapy and also likely without ovarian suppression.  We are considering ovarian suppression and clinical high risk low metabolic risk patients.  We did discuss the Zoladex option and she understands the multiple side effects that that would entail.  We are not going in that direction.  The plan then will be to start tamoxifen when she is done with radiation.  I am going to see her shortly before she finishes radiation and we will start tamoxifen likely a couple of weeks after that.  Today we did discuss the possible toxicities side effects and complications of this agent.  She also will be meeting with her gynecologist soon to have an IUD placed for contraception  I am delighted that she is  working on her blood sugar and her goal is to try to get the A1c under 6 which she has done in the past  She knows to call for any other issue that may develop before the next visit  Total encounter time 30 minutes.Sarajane Jews C. Aleysia Oltmann, MD 04/18/2020 9:38 AM Medical Oncology and Hematology [facility] [place], [place] [address] Tel. [phone]    Fax. [phone]   This document serves as a record of services personally performed by Lurline Del, MD. It was created on his behalf by Wilburn Mylar, a trained medical scribe. The creation of this record is based on the scribe's personal observations and the provider's statements to them.   I, Lurline Del MD, have reviewed the above documentation for accuracy and completeness, and I agree with the above.   *Total Encounter Time as defined by the Centers for Medicare and Medicaid Services includes, in addition to the face-to-face time of a patient visit (documented in the note above) non-face-to-face time: obtaining and reviewing outside history, ordering and reviewing medications, tests or procedures, care coordination (communications with other health care professionals or caregivers) and documentation in the medical record.

## 2020-04-18 ENCOUNTER — Inpatient Hospital Stay: Payer: 59

## 2020-04-18 ENCOUNTER — Inpatient Hospital Stay: Payer: 59 | Attending: Oncology | Admitting: Oncology

## 2020-04-18 ENCOUNTER — Other Ambulatory Visit: Payer: Self-pay

## 2020-04-18 VITALS — BP 135/91 | HR 94 | Temp 98.2°F | Resp 17 | Ht 64.0 in | Wt 247.6 lb

## 2020-04-18 DIAGNOSIS — F39 Unspecified mood [affective] disorder: Secondary | ICD-10-CM

## 2020-04-18 DIAGNOSIS — E119 Type 2 diabetes mellitus without complications: Secondary | ICD-10-CM | POA: Insufficient documentation

## 2020-04-18 DIAGNOSIS — Z170 Estrogen receptor positive status [ER+]: Secondary | ICD-10-CM

## 2020-04-18 DIAGNOSIS — C50211 Malignant neoplasm of upper-inner quadrant of right female breast: Secondary | ICD-10-CM

## 2020-04-18 DIAGNOSIS — E1169 Type 2 diabetes mellitus with other specified complication: Secondary | ICD-10-CM

## 2020-04-18 DIAGNOSIS — E11649 Type 2 diabetes mellitus with hypoglycemia without coma: Secondary | ICD-10-CM

## 2020-04-18 DIAGNOSIS — E1165 Type 2 diabetes mellitus with hyperglycemia: Secondary | ICD-10-CM | POA: Diagnosis not present

## 2020-04-18 DIAGNOSIS — D241 Benign neoplasm of right breast: Secondary | ICD-10-CM

## 2020-04-18 DIAGNOSIS — Z6841 Body Mass Index (BMI) 40.0 and over, adult: Secondary | ICD-10-CM

## 2020-04-18 LAB — CBC WITH DIFFERENTIAL/PLATELET
Abs Immature Granulocytes: 0.04 10*3/uL (ref 0.00–0.07)
Basophils Absolute: 0 10*3/uL (ref 0.0–0.1)
Basophils Relative: 0 %
Eosinophils Absolute: 0.2 10*3/uL (ref 0.0–0.5)
Eosinophils Relative: 2 %
HCT: 47.5 % — ABNORMAL HIGH (ref 36.0–46.0)
Hemoglobin: 15.9 g/dL — ABNORMAL HIGH (ref 12.0–15.0)
Immature Granulocytes: 1 %
Lymphocytes Relative: 24 %
Lymphs Abs: 1.9 10*3/uL (ref 0.7–4.0)
MCH: 28.3 pg (ref 26.0–34.0)
MCHC: 33.5 g/dL (ref 30.0–36.0)
MCV: 84.5 fL (ref 80.0–100.0)
Monocytes Absolute: 0.5 10*3/uL (ref 0.1–1.0)
Monocytes Relative: 7 %
Neutro Abs: 5.2 10*3/uL (ref 1.7–7.7)
Neutrophils Relative %: 66 %
Platelets: 340 10*3/uL (ref 150–400)
RBC: 5.62 MIL/uL — ABNORMAL HIGH (ref 3.87–5.11)
RDW: 12.4 % (ref 11.5–15.5)
WBC: 7.9 10*3/uL (ref 4.0–10.5)
nRBC: 0 % (ref 0.0–0.2)

## 2020-04-18 LAB — COMPREHENSIVE METABOLIC PANEL
ALT: 15 U/L (ref 0–44)
AST: 12 U/L — ABNORMAL LOW (ref 15–41)
Albumin: 3.7 g/dL (ref 3.5–5.0)
Alkaline Phosphatase: 68 U/L (ref 38–126)
Anion gap: 13 (ref 5–15)
BUN: 14 mg/dL (ref 6–20)
CO2: 24 mmol/L (ref 22–32)
Calcium: 9.5 mg/dL (ref 8.9–10.3)
Chloride: 99 mmol/L (ref 98–111)
Creatinine, Ser: 0.63 mg/dL (ref 0.44–1.00)
GFR calc Af Amer: >60 mL/min (ref >60)
GFR calc non Af Amer: >60 mL/min (ref >60)
Glucose, Bld: 267 mg/dL — ABNORMAL HIGH (ref 70–99)
Potassium: 4.1 mmol/L (ref 3.5–5.1)
Sodium: 136 mmol/L (ref 135–145)
Total Bilirubin: 0.3 mg/dL (ref 0.3–1.2)
Total Protein: 7.8 g/dL (ref 6.5–8.1)

## 2020-04-19 ENCOUNTER — Telehealth: Payer: Self-pay | Admitting: Adult Health

## 2020-04-19 NOTE — Telephone Encounter (Signed)
Scheduled appts  Per 5/5 los. Pt confirmed appt date and time.

## 2020-04-23 ENCOUNTER — Telehealth: Payer: Self-pay

## 2020-04-23 ENCOUNTER — Other Ambulatory Visit: Payer: Self-pay | Admitting: Radiation Oncology

## 2020-04-23 DIAGNOSIS — C50211 Malignant neoplasm of upper-inner quadrant of right female breast: Secondary | ICD-10-CM

## 2020-04-23 NOTE — Telephone Encounter (Signed)
Called patient in regards to lab work she will come in an hour before her appointment to go to the lab

## 2020-04-25 ENCOUNTER — Ambulatory Visit
Admission: RE | Admit: 2020-04-25 | Discharge: 2020-04-25 | Disposition: A | Discharge status: Home / Self Care | Payer: 59 | Source: Ambulatory Visit | Attending: Radiation Oncology | Admitting: Radiation Oncology

## 2020-04-25 ENCOUNTER — Encounter: Payer: Self-pay | Admitting: Radiation Oncology

## 2020-04-25 ENCOUNTER — Other Ambulatory Visit: Payer: Self-pay

## 2020-04-25 ENCOUNTER — Encounter: Payer: Self-pay | Admitting: *Deleted

## 2020-04-25 VITALS — BP 144/95 | HR 99 | Temp 98.3°F | Resp 20 | Ht 64.0 in | Wt 245.6 lb

## 2020-04-25 DIAGNOSIS — C50211 Malignant neoplasm of upper-inner quadrant of right female breast: Secondary | ICD-10-CM

## 2020-04-25 DIAGNOSIS — Z510 Encounter for antineoplastic radiation therapy: Secondary | ICD-10-CM | POA: Insufficient documentation

## 2020-04-25 DIAGNOSIS — Z170 Estrogen receptor positive status [ER+]: Secondary | ICD-10-CM

## 2020-04-25 LAB — PREGNANCY, URINE: Preg Test, Ur: NEGATIVE

## 2020-04-25 NOTE — Progress Notes (Signed)
Radiation Oncology         [phone] ________________________________  Name: [name]        MRN: [id number]  Date of Service: 04/25/2020 DOB: [birth year]  KD:TOIZTIW, [name], [place]  [name], Amy Dad, MD     REFERRING PHYSICIAN: Magrinat, Amy Dad, MD   DIAGNOSIS: The encounter diagnosis was Malignant neoplasm of upper-inner quadrant of right breast in female, estrogen receptor positive ([place]).   HISTORY OF PRESENT ILLNESS: [name] is a 44 y.o. female  originally seen in the multidisciplinary breast clinic for a new diagnosis of right breast cancer. The patient was noted to have a distortion on a diagnostic mammogram.  She has been followed for a benign-appearing mass.  Her further diagnostic images revealed distortion in the inner right breast without ultrasound correlate, her axilla was negative for adenopathy.  The mass that was being followed was also noted again in the 9 o'clock position and was felt to be stable.  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  Because of the findings she went back for a second biopsy of the mass that has been followed on 03/05/2020 and this was negative for malignancy. Mammaprint on her biopsy was low risk, and her genetic testing was negative. 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  Her margins were negative for disease and 4 sampled lymph nodes were negative for malignancy as well.  She is scheduled to meet with Dr. Simona Huh to have a ParaGard IUD placed IUD placed on Friday.  She did come in early to proceed with a pregnancy test which was negative, and is here now to talk about adjuvant radiotherapy.   PREVIOUS RADIATION THERAPY: No   PAST  MEDICAL HISTORY:  Past Medical History:  Diagnosis Date  . Bipolar 1 disorder ([place])   . Complication of anesthesia   . Diabetes mellitus type 2 in obese ([place])   . Family history of prostate cancer   . Obesity   . PONV (postoperative nausea and vomiting)   . Seasonal allergies        PAST SURGICAL HISTORY: Past Surgical History:  Procedure Laterality Date  . BREAST LUMPECTOMY WITH RADIOACTIVE SEED AND SENTINEL LYMPH NODE BIOPSY Right 04/04/2020   Procedure: RIGHT BREAST LUMPECTOMY WITH RADIOACTIVE SEED AND SENTINEL LYMPH NODE BIOPSY;  Surgeon: [name], MD;  Location: [facility];  Service: General;  Laterality: Right;  . OOPHORECTOMY  2009     FAMILY HISTORY:  Family History  Problem Relation Age of Onset  . Heart disease Other   . Macular degeneration Other   . Mental illness Other        both sides of family  . Prostate cancer Paternal Uncle 26  . Immunodeficiency Maternal Aunt   . Dementia Maternal Grandmother   . Diabetes Maternal Aunt      SOCIAL HISTORY:  reports that she has quit smoking. She has never used smokeless tobacco. She reports current alcohol use. She reports that she does not use drugs.  The patient is married and lives in [place].  She works for [employer].   ALLERGIES: Patient has no known allergies.   MEDICATIONS:  Current Outpatient Medications  Medication Sig Dispense Refill  .  ALPRAZolam (XANAX) 0.25 MG tablet Take 1 tablet (0.25 mg) two times daily as needed for anxiety 30 tablet 0  . benztropine (COGENTIN) 1 MG tablet Take 1 tablet (1 mg total) by mouth 2 (two) times daily. For prevention of drug induced involuntary movement 60 tablet 0  . divalproex (DEPAKOTE ER) 500 MG 24 hr tablet Take 1 tablet (500 mg total) by mouth 2 (two) times daily. For mood stabilization 60 tablet 0  . metFORMIN (GLUCOPHAGE) 500 MG tablet Take 1 tablet (500 mg total) by mouth 2 (two) times daily with a meal. For diabetes management 60 tablet 3   . OLANZapine zydis (ZYPREXA) 5 MG disintegrating tablet Take 0.5 tablets (2.5 mg total) by mouth daily. For mood control    . trihexyphenidyl (ARTANE) 5 MG tablet      No current facility-administered medications for this encounter.     REVIEW OF SYSTEMS: On review of systems, the patient reports that she is doing well overall.  She feels that she is having some sensory changes in the axilla since her surgery, otherwise she is doing quite well with her healing and denies any concerns with swelling or risks of infection.  She is also been trying to make improvements in her eating and exercise to try and improve her globin A1c.  She denies headaches or visual disturbances.  No other complaints are verbalized.    PHYSICAL EXAM:  Wt Readings from Last 3 Encounters:  04/25/20 245 lb 9.6 oz (111.4 kg)  04/18/20 247 lb 9.6 oz (112.3 kg)  04/04/20 245 lb 6 oz (111.3 kg)   Temp Readings from Last 3 Encounters:  04/25/20 98.3 F (36.8 C)  04/18/20 98.2 F (36.8 C) (Temporal)  04/04/20 97.8 F (36.6 C)   BP Readings from Last 3 Encounters:  04/25/20 (!) 144/95  04/18/20 (!) 135/91  04/04/20 122/83   Pulse Readings from Last 3 Encounters:  04/25/20 99  04/18/20 94  04/04/20 82    In general this is a well appearing caucasin female in no acute distress. She's alert and oriented x4 and appropriate throughout the examination. Cardiopulmonary assessment is negative for acute distress and she exhibits normal effort.  Her right breast incision is well-healed without evidence of cellulitic streaking bleeding or drainage.  No evidence of edema is noted of the breast, chest wall or upper extremity on the right.  Her lymph axillary site is also intact without cellulitic streaking bleeding or drainage.    ECOG = 0  0 - Asymptomatic (Fully active, able to carry on all predisease activities without restriction)  1 - Symptomatic but completely ambulatory (Restricted in physically strenuous  activity but ambulatory and able to carry out work of a light or sedentary nature. For example, light housework, office work)  2 - Symptomatic, <50% in bed during the day (Ambulatory and capable of all self care but unable to carry out any work activities. Up and about more than 50% of waking hours)  3 - Symptomatic, >50% in bed, but not bedbound (Capable of only limited self-care, confined to bed or chair 50% or more of waking hours)  4 - Bedbound (Completely disabled. Cannot carry on any self-care. Totally confined to bed or chair)  5 - Death   Eustace Pen MM, Creech RH, Tormey DC, et al. [phone]). "Toxicity and response criteria of the [facility]". [place] Oncol. 5 (6): 649-55    LABORATORY DATA:  Lab Results  Component Value Date   WBC 7.9  04/18/2020   HGB 15.9 (H) 04/18/2020   HCT 47.5 (H) 04/18/2020   MCV 84.5 04/18/2020   PLT 340 04/18/2020   Lab Results  Component Value Date   NA 136 04/18/2020   K 4.1 04/18/2020   CL 99 04/18/2020   CO2 24 04/18/2020   Lab Results  Component Value Date   ALT 15 04/18/2020   AST 12 (L) 04/18/2020   ALKPHOS 68 04/18/2020   BILITOT 0.3 04/18/2020      RADIOGRAPHY: NM Sentinel Node Inj-No Rpt (Breast)  Result Date: 04/04/2020 Sulfur colloid was injected by the nuclear medicine technologist for melanoma sentinel node.   MM Breast Surgical Specimen  Result Date: 04/04/2020 CLINICAL DATA:  Evaluate specimen EXAM: SPECIMEN RADIOGRAPH OF THE RIGHT BREAST COMPARISON:  Previous exam(s). FINDINGS: Status post excision of the right breast. The radioactive seed and biopsy marker clip are present, completely intact, and were marked for pathology. IMPRESSION: Specimen radiograph of the right breast. Electronically Signed   By: Dorise Bullion III M.D   On: 04/04/2020 13:53   MM RT RADIOACTIVE SEED LOC MAMMO GUIDE  Result Date: 04/03/2020 CLINICAL DATA:  44 year old with a biopsy-proven grade 1 invasive ductal carcinoma  involving the INNER RIGHT breast at MIDDLE depth, slight UPPER INNER QUADRANT, associated with an X shaped clip. She had a recent benign biopsy of a mass in the OUTER RIGHT breast with pathology revealing fibroadenoma, associated with a ribbon shaped clip. EXAM: MAMMOGRAPHIC GUIDED RADIOACTIVE SEED LOCALIZATION OF THE RIGHT BREAST COMPARISON:  Previous exam(s). FINDINGS: Patient presents for radioactive seed localization prior to RIGHT breast lumpectomy. I met with the patient and we discussed the procedure of seed localization including benefits and alternatives. We discussed the high likelihood of a successful procedure. We discussed the risks of the procedure including infection, bleeding, tissue injury and further surgery. We discussed the low dose of radioactivity involved in the procedure. Informed, written consent was given. The usual time-out protocol was performed immediately prior to the procedure. Using mammographic guidance, sterile technique, 1% lidocaine as local anesthetic, an I-125 radioactive seed was used to localize the X shaped tissue marker clip associated with the biopsy-proven invasive ductal carcinoma in the INNER RIGHT breast using a medial approach. The follow-up mammogram images confirm that the seed is in the expected location immediately adjacent to the X clip. The images are marked for Dr. Ninfa Linden. Follow-up survey of the patient confirms the presence of the radioactive seed. Order number of I-125 seed: [id number] Total activity: 0.246 mCi Reference Date: 03/22/2020 The patient tolerated the procedure well and was released from the [facility]. She was given instructions regarding seed removal. IMPRESSION: Radioactive seed localization of biopsy-proven invasive ductal carcinoma involving the INNER RIGHT breast. No apparent complications. Electronically Signed   By: Evangeline Dakin M.D.   On: 04/03/2020 14:07       IMPRESSION/PLAN: 1. Stage IA, cT1cN0M0 grade 1 ER/PR positive  invasive ductal carcinoma of the right breast. Dr. Lisbeth Renshaw discusses the final pathology findings and reviews the nature of right breast disease.  She is doing very well since her surgery, and is ready to proceed with adjuvant external radiotherapy to the breast followed by antiestrogen therapy.  We again reviewed the rationale for radiotherapy. We discussed the risks, benefits, short, and long term effects of radiotherapy. Dr. Lisbeth Renshaw discusses the delivery and logistics of radiotherapy and anticipates a course of 6 1/2 weeks of radiotherapy.  She will simulate this morning. Written consent is obtained and placed in  the chart, a copy was provided to the patient. 2. Hyperglycemia. The patient had a glucose of 637 when we first met, her sugars have been averaging in the low 200s since.  We will continue to follow this expectantly but she will notify us if she becomes symptomatic. 3. Contraceptive counseling.  The patient is having a ParaGard copper IUD placed this week.  Pregnancy test today was negative.  A copy of this was provided to her so that Dr. Margie Billet is aware as well  In a visit lasting 45 minutes, greater than 50% of the time was spent face to face reviewing her case, as well as in preparation of, discussing, and coordinating the patient's care.  The above documentation reflects my direct findings during this shared patient visit. Please see the separate note by Dr. Lisbeth Renshaw on this date for the remainder of the patient's plan of care.    Carola Rhine, PAC

## 2020-04-26 ENCOUNTER — Encounter: Payer: Self-pay | Admitting: Physical Therapy

## 2020-04-26 ENCOUNTER — Ambulatory Visit: Payer: 59 | Attending: Surgery | Admitting: Physical Therapy

## 2020-04-26 DIAGNOSIS — Z170 Estrogen receptor positive status [ER+]: Secondary | ICD-10-CM | POA: Insufficient documentation

## 2020-04-26 DIAGNOSIS — R293 Abnormal posture: Secondary | ICD-10-CM | POA: Diagnosis present

## 2020-04-26 DIAGNOSIS — C50311 Malignant neoplasm of lower-inner quadrant of right female breast: Secondary | ICD-10-CM | POA: Diagnosis present

## 2020-04-26 DIAGNOSIS — Z483 Aftercare following surgery for neoplasm: Secondary | ICD-10-CM | POA: Diagnosis present

## 2020-04-26 NOTE — Patient Instructions (Signed)
Closed Chain: Shoulder Abduction / Adduction - on Wall    One hand on wall, step to side and return. Stepping causes shoulder to abduct and adduct. Step _5__ times, holding 5 seconds, _3 times per day. http://ss.exer.us/267   Copyright  VHI. All rights reserved.

## 2020-04-26 NOTE — Therapy (Signed)
[place], [place], [address] Phone: [phone]   Fax:  [phone]  Physical Therapy Treatment  Patient Details  Name: [name] MRN: [id number] Date of Birth: 1976-08-13 Referring Provider (PT): Dr. [name]   Encounter Date: 04/26/2020  PT End of Session - 04/26/20 0943    Visit Number  2    Number of Visits  2    PT Start Time  0900    PT Stop Time  0950    PT Time Calculation (min)  50 min    Activity Tolerance  Patient tolerated treatment well    Behavior During Therapy  [facility] for tasks assessed/performed       Past Medical History:  Diagnosis Date  . Bipolar 1 disorder ([place])   . Complication of anesthesia   . Diabetes mellitus type 2 in obese ([place])   . Family history of prostate cancer   . Obesity   . PONV (postoperative nausea and vomiting)   . Seasonal allergies     Past Surgical History:  Procedure Laterality Date  . BREAST LUMPECTOMY WITH RADIOACTIVE SEED AND SENTINEL LYMPH NODE BIOPSY Right 04/04/2020   Procedure: RIGHT BREAST LUMPECTOMY WITH RADIOACTIVE SEED AND SENTINEL LYMPH NODE BIOPSY;  Surgeon: [name], MD;  Location: [facility];  Service: General;  Laterality: Right;  . OOPHORECTOMY  2009    There were no vitals filed for this visit.  Subjective Assessment - 04/26/20 0904    Subjective  Patient underwent a right lumpectomy and sentinel node biopsy on 04/04/2020 with 4 negative lymph nodes removed. Mammaprint was low so no chemo needed but she will begin radiation on 05/02/2020 for 6 weeks.    Pertinent History  Patient was diagnosed on 02/27/2020 with right invasive ductal carcinoma breast cancer. Patient underwent a right lumpectomy and sentinel node biopsy on 04/04/2020 with 4 negative lymph nodes removed. It is ER/PR positive and HER2 negative with a Ki67 of 2%. She has diabetes and today has a blood glucose level of > 600. She reports  she does not take her diabetic medication.    Patient Stated Goals  see if my arm is ok    Currently in Pain?  No/denies         [facility] PT Assessment - 04/26/20 0001      Assessment   Medical Diagnosis  s/p right lumpectomy and SLNB    Referring Provider (PT)  Dr. [name]    Onset Date/Surgical Date  04/04/20    Hand Dominance  Right    Prior Therapy  Baselines      Precautions   Precautions  Other (comment)    Precaution Comments  active cancer; uncontrolled diabetes      Restrictions   Weight Bearing Restrictions  No      Balance Screen   Has the patient fallen in the past 6 months  No    Has the patient had a decrease in activity level because of a fear of falling?   No    Is the patient reluctant to leave their home because of a fear of falling?   No      Home Environment   Living Environment  Private residence    Living Arrangements  Spouse/significant other    Available Help at Discharge  Family      Prior Function   Level of Independence  Independent    Vocation  Full time employment  Vocation Requirements  Has not returned to work yet    Leisure  She is walking some and plans to begin yoga      Cognition   Overall Cognitive Status  Within Functional Limits for tasks assessed      Observation/Other Assessments   Observations  Right axillary and breast incisions both appear to be well healed. No edema visibly present.      AROM   AROM Assessment Site  Shoulder    Right/Left Shoulder  Right    Right Shoulder Extension  52 Degrees    Right Shoulder Flexion  146 Degrees    Right Shoulder ABduction  147 Degrees    Right Shoulder Internal Rotation  55 Degrees    Right Shoulder External Rotation  86 Degrees      Strength   Overall Strength  Within functional limits for tasks performed        LYMPHEDEMA/ONCOLOGY QUESTIONNAIRE - 04/26/20 0917      Type   Cancer Type  Right breast cancer      Surgeries   Lumpectomy Date  04/04/20    Sentinel  Lymph Node Biopsy Date  04/04/20    Number Lymph Nodes Removed  4      Treatment   Active Chemotherapy Treatment  No    Past Chemotherapy Treatment  No    Active Radiation Treatment  No    Past Radiation Treatment  No    Current Hormone Treatment  No    Past Hormone Therapy  No      What other symptoms do you have   Are you Having Heaviness or Tightness  No    Are you having Pain  No    Are you having pitting edema  No    Is it Hard or Difficult finding clothes that fit  No    Do you have infections  No    Is there Decreased scar mobility  Yes    St[place]mer Sign  No      Lymphedema Assessments   Lymphedema Assessments  Upper extremities      Right Upper Extremity Lymphedema   10 cm Proximal to Olecranon Process  34.1 cm    Olecranon Process  28 cm    10 cm Proximal to Ulnar Styloid Process  26.9 cm    Just Proximal to Ulnar Styloid Process  19.2 cm    Across Hand at [employer]  20.9 cm    At [place] of 2nd Digit  7.2 cm      Left Upper Extremity Lymphedema   10 cm Proximal to Olecranon Process  34.1 cm    Olecranon Process  29 cm    10 cm Proximal to Ulnar Styloid Process  27.6 cm    Just Proximal to Ulnar Styloid Process  19.5 cm    Across Hand at [employer]  21 cm    At [place] of 2nd Digit  7.2 cm         Quick Dash - 04/26/20 0001    Open a tight or new jar  Mild difficulty    Do heavy household chores (wash walls, wash floors)  Mild difficulty    Carry a shopping bag or briefcase  No difficulty    Wash your back  Moderate difficulty    Use a knife to cut food  No difficulty    Recreational activities in which you take some force or impact through your arm, shoulder, or hand (golf,  hammering, tennis)  Mild difficulty    During the past week, to what extent has your arm, shoulder or hand problem interfered with your normal social activities with family, friends, neighbors, or groups?  Not at all    During the past week, to what extent has your arm, shoulder or  hand problem limited your work or other regular daily activities  Slightly    Arm, shoulder, or hand pain.  None    Tingling (pins and needles) in your arm, shoulder, or hand  None    Difficulty Sleeping  Mild difficulty    DASH Score  15.91 %                     PT Education - 04/26/20 0943    Education Details  Reviewed lymphedema risk reduction; reviewed HEP and closed chain abduction neural stretch    Person(s) Educated  Patient    Methods  Explanation;Demonstration    Comprehension  Verbalized understanding;Returned demonstration          PT Long Term Goals - 04/26/20 1058      PT LONG TERM GOAL #1   Title  Patient will demonstrate she has regained full shoulder ROM and function post operatively compared to baselines.    Time  8    Period  Weeks    Status  Achieved            Plan - 04/26/20 1055    Clinical Impression Statement  Patient is doing very well s/p right lumpectomy and sentinel node biopsy. She will begin radiation next week. Her shoulder ROM is back to baseline, there are no signs of lymphedema, and her incisions are healing well. She will benefit from participating in the [facility] class for lymphedema risk reduction education and fro performing some scar massage on her incision sites. Otherwise, she has no PT needs at this time. We did spend time discussing the importance of regular exercise to include walking, water exercises, and yoga or Tai Chi for reducing recurrence risk and she was very open to all of that. She knows to contact me with any questions or concerns.    PT Treatment/Interventions  ADLs/Self Care Home Management;Therapeutic exercise;Patient/family education    PT Next Visit Plan  D/C but will see every 3 months for L-Dex screenings    PT Home Exercise Plan  Post op shoulder ROM HEP and closed chain abduction stretch    Consulted and Agree with Plan of Care  Patient       Patient will benefit from skilled therapeutic intervention  in order to improve the following deficits and impairments:  Postural dysfunction, Decreased range of motion, Decreased knowledge of precautions, Impaired UE functional use, Pain, Decreased scar mobility  Visit Diagnosis: Malignant neoplasm of lower-inner quadrant of right breast of female, estrogen receptor positive ([facility])  Abnormal posture  Aftercare following surgery for neoplasm     Problem List Patient Active Problem List   Diagnosis Date Noted  . Genetic testing 03/26/2020  . Morbid obesity with BMI of 40.0-44.9, adult ([place]) 03/07/2020  . Type 2 diabetes mellitus, uncontrolled ([place]) 03/07/2020  . Fibroadenoma of right breast 03/07/2020  . Family history of prostate cancer   . Malignant neoplasm of upper-inner quadrant of right breast in female, estrogen receptor positive ([place]) 03/05/2020  . Embedded earring of left ear 08/11/2014  . Diabetes mellitus type 2 in obese ([place])   . Seasonal allergies   . Mood disorder ([place]) 08/08/2014  .  Bipolar 1 disorder, manic, moderate ([place]) 08/08/2014   PHYSICAL THERAPY DISCHARGE SUMMARY  Visits from Start of Care: 2  Current functional level related to goals / functional outcomes: Goals met; see above for objective findings.   Remaining deficits: Scar tightness and slight abduction ROM deficit.   Education / Equipment: HEP Plan: Patient agrees to discharge.  Patient goals were met. Patient is being discharged due to meeting the stated rehab goals.  ?????         Annia Friendly, [place] 04/26/20 11:00 AM  [place], [place], [address] Phone: [phone]   Fax:  [phone]  Name: Emonii Wienke Abel MRN: [id number] Date of Birth: Mar 31, 1976

## 2020-05-01 DIAGNOSIS — Z510 Encounter for antineoplastic radiation therapy: Secondary | ICD-10-CM | POA: Diagnosis not present

## 2020-05-02 ENCOUNTER — Other Ambulatory Visit: Payer: Self-pay

## 2020-05-02 ENCOUNTER — Ambulatory Visit
Admission: RE | Admit: 2020-05-02 | Discharge: 2020-05-02 | Disposition: A | Discharge status: Home / Self Care | Payer: 59 | Source: Ambulatory Visit | Attending: Radiation Oncology | Admitting: Radiation Oncology

## 2020-05-02 DIAGNOSIS — Z510 Encounter for antineoplastic radiation therapy: Secondary | ICD-10-CM | POA: Diagnosis not present

## 2020-05-03 ENCOUNTER — Other Ambulatory Visit: Payer: Self-pay

## 2020-05-03 ENCOUNTER — Ambulatory Visit
Admission: RE | Admit: 2020-05-03 | Discharge: 2020-05-03 | Disposition: A | Discharge status: Home / Self Care | Payer: 59 | Source: Ambulatory Visit | Attending: Radiation Oncology | Admitting: Radiation Oncology

## 2020-05-03 DIAGNOSIS — Z510 Encounter for antineoplastic radiation therapy: Secondary | ICD-10-CM | POA: Diagnosis not present

## 2020-05-04 ENCOUNTER — Ambulatory Visit
Admission: RE | Admit: 2020-05-04 | Discharge: 2020-05-04 | Disposition: A | Discharge status: Home / Self Care | Payer: 59 | Source: Ambulatory Visit | Attending: Radiation Oncology | Admitting: Radiation Oncology

## 2020-05-04 ENCOUNTER — Other Ambulatory Visit: Payer: Self-pay

## 2020-05-04 DIAGNOSIS — C50211 Malignant neoplasm of upper-inner quadrant of right female breast: Secondary | ICD-10-CM

## 2020-05-04 DIAGNOSIS — Z510 Encounter for antineoplastic radiation therapy: Secondary | ICD-10-CM | POA: Diagnosis not present

## 2020-05-04 MED ORDER — ALRA NON-METALLIC DEODORANT (RAD-ONC)
1.0000 "application " | Freq: Once | TOPICAL | Status: AC
  Administered 2020-05-04: 1 via TOPICAL

## 2020-05-04 MED ORDER — SONAFINE EX EMUL
1.0000 "application " | Freq: Once | CUTANEOUS | Status: AC
  Administered 2020-05-04: 1 via TOPICAL

## 2020-05-04 NOTE — Progress Notes (Signed)
Pt here for patient teaching.  Pt given Radiation and You booklet, skin care instructions, Radiaplex gel and Sonafine.  Reviewed areas of pertinence such as fatigue, hair loss, skin changes, breast tenderness and breast swelling . Pt able to give teach back of to pat skin and use unscented/gentle soap,apply Sonafine bid, avoid applying anything to skin within 4 hours of treatment, avoid wearing an under wire bra and to use an electric razor if they must shave. Pt verbalizes understanding of information given and will contact nursing with any questions or concerns.     [name]. Leonie Green, BSN

## 2020-05-07 ENCOUNTER — Other Ambulatory Visit: Payer: Self-pay

## 2020-05-07 ENCOUNTER — Ambulatory Visit
Admission: RE | Admit: 2020-05-07 | Discharge: 2020-05-07 | Disposition: A | Discharge status: Home / Self Care | Payer: 59 | Source: Ambulatory Visit | Attending: Radiation Oncology | Admitting: Radiation Oncology

## 2020-05-07 DIAGNOSIS — Z510 Encounter for antineoplastic radiation therapy: Secondary | ICD-10-CM | POA: Diagnosis not present

## 2020-05-08 ENCOUNTER — Ambulatory Visit
Admission: RE | Admit: 2020-05-08 | Discharge: 2020-05-08 | Disposition: A | Discharge status: Home / Self Care | Payer: 59 | Source: Ambulatory Visit | Attending: Radiation Oncology | Admitting: Radiation Oncology

## 2020-05-08 ENCOUNTER — Other Ambulatory Visit: Payer: Self-pay

## 2020-05-08 DIAGNOSIS — Z510 Encounter for antineoplastic radiation therapy: Secondary | ICD-10-CM | POA: Diagnosis not present

## 2020-05-09 ENCOUNTER — Other Ambulatory Visit: Payer: Self-pay

## 2020-05-09 ENCOUNTER — Ambulatory Visit
Admission: RE | Admit: 2020-05-09 | Discharge: 2020-05-09 | Disposition: A | Discharge status: Home / Self Care | Payer: 59 | Source: Ambulatory Visit | Attending: Radiation Oncology | Admitting: Radiation Oncology

## 2020-05-09 DIAGNOSIS — Z510 Encounter for antineoplastic radiation therapy: Secondary | ICD-10-CM | POA: Diagnosis not present

## 2020-05-10 ENCOUNTER — Other Ambulatory Visit: Payer: Self-pay

## 2020-05-10 ENCOUNTER — Ambulatory Visit
Admission: RE | Admit: 2020-05-10 | Discharge: 2020-05-10 | Disposition: A | Discharge status: Home / Self Care | Payer: 59 | Source: Ambulatory Visit | Attending: Radiation Oncology | Admitting: Radiation Oncology

## 2020-05-10 DIAGNOSIS — Z510 Encounter for antineoplastic radiation therapy: Secondary | ICD-10-CM | POA: Diagnosis not present

## 2020-05-11 ENCOUNTER — Other Ambulatory Visit: Payer: Self-pay | Admitting: Radiation Oncology

## 2020-05-11 ENCOUNTER — Ambulatory Visit
Admission: RE | Admit: 2020-05-11 | Discharge: 2020-05-11 | Disposition: A | Discharge status: Home / Self Care | Payer: 59 | Source: Ambulatory Visit | Attending: Radiation Oncology | Admitting: Radiation Oncology

## 2020-05-11 ENCOUNTER — Other Ambulatory Visit: Payer: Self-pay

## 2020-05-11 DIAGNOSIS — Z510 Encounter for antineoplastic radiation therapy: Secondary | ICD-10-CM | POA: Diagnosis not present

## 2020-05-11 MED ORDER — ONDANSETRON HCL 4 MG PO TABS: 4.0000 mg | ORAL_TABLET | Freq: Three times a day (TID) | ORAL | 1 refills | Status: DC | PRN

## 2020-05-15 ENCOUNTER — Ambulatory Visit
Admission: RE | Admit: 2020-05-15 | Discharge: 2020-05-15 | Disposition: A | Discharge status: Home / Self Care | Payer: 59 | Source: Ambulatory Visit | Attending: Radiation Oncology | Admitting: Radiation Oncology

## 2020-05-15 ENCOUNTER — Other Ambulatory Visit: Payer: Self-pay

## 2020-05-15 DIAGNOSIS — Z510 Encounter for antineoplastic radiation therapy: Secondary | ICD-10-CM | POA: Insufficient documentation

## 2020-05-15 DIAGNOSIS — C50211 Malignant neoplasm of upper-inner quadrant of right female breast: Secondary | ICD-10-CM | POA: Insufficient documentation

## 2020-05-15 DIAGNOSIS — Z170 Estrogen receptor positive status [ER+]: Secondary | ICD-10-CM | POA: Diagnosis present

## 2020-05-16 ENCOUNTER — Ambulatory Visit
Admission: RE | Admit: 2020-05-16 | Discharge: 2020-05-16 | Disposition: A | Discharge status: Home / Self Care | Payer: 59 | Source: Ambulatory Visit | Attending: Radiation Oncology | Admitting: Radiation Oncology

## 2020-05-16 ENCOUNTER — Other Ambulatory Visit: Payer: Self-pay

## 2020-05-16 DIAGNOSIS — C50211 Malignant neoplasm of upper-inner quadrant of right female breast: Secondary | ICD-10-CM | POA: Diagnosis not present

## 2020-05-17 ENCOUNTER — Ambulatory Visit
Admission: RE | Admit: 2020-05-17 | Discharge: 2020-05-17 | Disposition: A | Discharge status: Home / Self Care | Payer: 59 | Source: Ambulatory Visit | Attending: Radiation Oncology | Admitting: Radiation Oncology

## 2020-05-17 ENCOUNTER — Other Ambulatory Visit: Payer: Self-pay

## 2020-05-17 DIAGNOSIS — C50211 Malignant neoplasm of upper-inner quadrant of right female breast: Secondary | ICD-10-CM | POA: Diagnosis not present

## 2020-05-18 ENCOUNTER — Ambulatory Visit
Admission: RE | Admit: 2020-05-18 | Discharge: 2020-05-18 | Disposition: A | Discharge status: Home / Self Care | Payer: 59 | Source: Ambulatory Visit | Attending: Radiation Oncology | Admitting: Radiation Oncology

## 2020-05-18 ENCOUNTER — Other Ambulatory Visit: Payer: Self-pay

## 2020-05-18 DIAGNOSIS — C50211 Malignant neoplasm of upper-inner quadrant of right female breast: Secondary | ICD-10-CM | POA: Diagnosis not present

## 2020-05-21 ENCOUNTER — Ambulatory Visit
Admission: RE | Admit: 2020-05-21 | Discharge: 2020-05-21 | Disposition: A | Discharge status: Home / Self Care | Payer: 59 | Source: Ambulatory Visit | Attending: Radiation Oncology | Admitting: Radiation Oncology

## 2020-05-21 ENCOUNTER — Other Ambulatory Visit: Payer: Self-pay

## 2020-05-21 DIAGNOSIS — C50211 Malignant neoplasm of upper-inner quadrant of right female breast: Secondary | ICD-10-CM | POA: Diagnosis not present

## 2020-05-22 ENCOUNTER — Ambulatory Visit
Admission: RE | Admit: 2020-05-22 | Discharge: 2020-05-22 | Disposition: A | Discharge status: Home / Self Care | Payer: 59 | Source: Ambulatory Visit | Attending: Radiation Oncology | Admitting: Radiation Oncology

## 2020-05-22 ENCOUNTER — Other Ambulatory Visit: Payer: Self-pay

## 2020-05-22 DIAGNOSIS — C50211 Malignant neoplasm of upper-inner quadrant of right female breast: Secondary | ICD-10-CM | POA: Diagnosis not present

## 2020-05-23 ENCOUNTER — Ambulatory Visit
Admission: RE | Admit: 2020-05-23 | Discharge: 2020-05-23 | Disposition: A | Discharge status: Home / Self Care | Payer: 59 | Source: Ambulatory Visit | Attending: Radiation Oncology | Admitting: Radiation Oncology

## 2020-05-23 ENCOUNTER — Other Ambulatory Visit: Payer: Self-pay

## 2020-05-23 DIAGNOSIS — C50211 Malignant neoplasm of upper-inner quadrant of right female breast: Secondary | ICD-10-CM | POA: Diagnosis not present

## 2020-05-24 ENCOUNTER — Other Ambulatory Visit: Payer: Self-pay

## 2020-05-24 ENCOUNTER — Ambulatory Visit
Admission: RE | Admit: 2020-05-24 | Discharge: 2020-05-24 | Disposition: A | Discharge status: Home / Self Care | Payer: 59 | Source: Ambulatory Visit | Attending: Radiation Oncology | Admitting: Radiation Oncology

## 2020-05-24 DIAGNOSIS — C50211 Malignant neoplasm of upper-inner quadrant of right female breast: Secondary | ICD-10-CM | POA: Diagnosis not present

## 2020-05-25 ENCOUNTER — Ambulatory Visit
Admission: RE | Admit: 2020-05-25 | Discharge: 2020-05-25 | Disposition: A | Discharge status: Home / Self Care | Payer: 59 | Source: Ambulatory Visit | Attending: Radiation Oncology | Admitting: Radiation Oncology

## 2020-05-25 ENCOUNTER — Other Ambulatory Visit: Payer: Self-pay

## 2020-05-25 DIAGNOSIS — C50211 Malignant neoplasm of upper-inner quadrant of right female breast: Secondary | ICD-10-CM | POA: Diagnosis not present

## 2020-05-25 DIAGNOSIS — Z170 Estrogen receptor positive status [ER+]: Secondary | ICD-10-CM

## 2020-05-25 MED ORDER — SONAFINE EX EMUL
1.0000 "application " | Freq: Once | CUTANEOUS | Status: AC
  Administered 2020-05-25: 1 via TOPICAL

## 2020-05-27 NOTE — Progress Notes (Signed)
[facility]  [phone] [phone]     ID: [name] DOB: 08-20-1976  MR#: [id number]  EUM#:[id number]  Patient Care Team: Precious Gilding, [place] as PCP - General (Physician Assistant) System, Provider Not In Thurnell Lose, MD as Consulting Physician (Obstetrics and Gynecology) Mauro Kaufmann, RN as Oncology Nurse Navigator Rockwell Germany, RN as Oncology Nurse Navigator [name], MD as Consulting Physician (General Surgery) Virlee Stroschein, Virgie Dad, MD as Consulting Physician (Oncology) Kyung Rudd, MD as Consulting Physician (Radiation Oncology) Chauncey Cruel, MD OTHER MD: Pauline Good, NP (psychiatry)   CHIEF COMPLAINT: estrogen receptor positive breast cancer  CURRENT TREATMENT: Adjuvant radiation to be followed by tamoxifen   INTERVAL HISTORY: [name] returns today for follow up of her estrogen receptor positive breast cancer.  Since her last visit, she was referred back to Dr. Lisbeth Renshaw on 04/25/2020 to discuss adjuvant radiation therapy. She started treatment on 05/02/2020 and is scheduled through 06/19/2020.  She is tolerating radiation well so far, with some inframammary erythema and some mild fatigue as the only symptoms   REVIEW OF SYSTEMS: [name] tells me she had an IUD placed by her gynecologist but it went in not quite right and had to be removed.  She has had normal periods since.  They are using barrier methods for contraception until she gets the IUD replaced which is going to be in August after she is done with radiation.  She is watching her diet and she tells me she is making good progress on her diabetes.  She requested that we refill her Metformin until she gets back with her primary care physician which I was glad to do.    HISTORY OF CURRENT ILLNESS: From the original intake note:  "[name]" had routine screening mammography on 02/07/2019 showing a possible abnormality in the right breast. She underwent right diagnostic  mammography with tomography and right breast ultrasonography at [facility] on 02/23/2019 showing: breast density category B; probably-benign 0.7 cm mass in right breast at 9 o'clock, likely a fibroadenoma. She returned for short-term right breast ultrasound on 08/29/2019, which showed stability of the probably-benign mass.  She returned for her annual mammography and underwent bilateral diagnostic mammography with tomography and right breast ultrasonography at [facility] on 02/27/2020 showing: breast density category B; new area of distortion in medial inferior right breast; this could not be demonstrated on ultrasound.  There was a stable probably-benign right breast mass at 9 o'clock; no evidence of left breast malignancy or lymphadenopathy.  Accordingly on 03/01/2020 she proceeded to biopsy of the right breast area in question. The pathology from this procedure ([id number]) showed: invasive ductal carcinoma, grade 1; adenosis with calcifications. Prognostic indicators significant for: estrogen receptor, 95% positive with moderate staining intensity and progesterone receptor, 95% positive with strong staining intensity. Proliferation marker Ki67 at 2%. HER2 negative by immunohistochemistry (1+).  Biopsy of the probably-benign mass was performed on 03/05/2020. Pathology [id number]) confirmed this to be a fibroadenoma.  The patient's subsequent history is as detailed below.   PAST MEDICAL HISTORY: Past Medical History:  Diagnosis Date  . Bipolar 1 disorder ([place])   . Complication of anesthesia   . Diabetes mellitus type 2 in obese ([place])   . Family history of prostate cancer   . Obesity   . PONV (postoperative nausea and vomiting)   . Seasonal allergies     PAST SURGICAL HISTORY: Past Surgical History:  Procedure Laterality Date  . BREAST LUMPECTOMY WITH RADIOACTIVE SEED  AND SENTINEL LYMPH NODE BIOPSY Right 04/04/2020   Procedure: RIGHT BREAST LUMPECTOMY WITH RADIOACTIVE SEED AND  SENTINEL LYMPH NODE BIOPSY;  Surgeon: [name], MD;  Location: [facility];  Service: General;  Laterality: Right;  . OOPHORECTOMY  2009    FAMILY HISTORY: Family History  Problem Relation Age of Onset  . Heart disease Other   . Macular degeneration Other   . Mental illness Other        both sides of family  . Prostate cancer Paternal Uncle 58  . Immunodeficiency Maternal Aunt   . Dementia Maternal Grandmother   . Diabetes Maternal Aunt   Her father is age 49 and her mother 35 as of 02/2020.  The patient has 3 brothers and 1 sister. She reports prostate cancer in a paternal uncle at age 61.  There is a family history of ovarian cancer.  She denies a family history of breast or pancreatic cancer.   GYNECOLOGIC HISTORY:  No LMP recorded. (Menstrual status: Other). Menarche: 44 years old [place] P 0 LMP 02/2020, regular and lasts 5-7 days Contraceptive: used since 2009, will switch to IUD with cancer diagnosis HRT n/a  Hysterectomy? no BSO? 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 Husband Amy Rinks "Ulice Dash" is a Administrator.  At home is just the 2 of them plus a cat.  They recently moved to a townhouse with a lot of stairs and that is helping her exercise.  She is not a Designer, fashion/clothing.    ADVANCED DIRECTIVES: In the absence of any documentation to the contrary, the patient's spouse is their HCPOA.    HEALTH MAINTENANCE: Social History   Tobacco Use  . Smoking status: Former Research scientist (life sciences)  . Smokeless tobacco: Never Used  Substance Use Topics  . Alcohol use: Yes    Comment: social  . Drug use: No     Colonoscopy: n/a (age)  PAP: 12/2018, negative  Bone density: n/a (age)   No Known Allergies  Current Outpatient Medications  Medication Sig Dispense Refill  . ALPRAZolam (XANAX) 0.25 MG tablet Take 1 tablet (0.25 mg) two  times daily as needed for anxiety 30 tablet 0  . benztropine (COGENTIN) 1 MG tablet Take 1 tablet (1 mg total) by mouth 2 (two) times daily. For prevention of drug induced involuntary movement 60 tablet 0  . divalproex (DEPAKOTE ER) 500 MG 24 hr tablet Take 1 tablet (500 mg total) by mouth 2 (two) times daily. For mood stabilization 60 tablet 0  . metFORMIN (GLUCOPHAGE) 500 MG tablet Take 1 tablet (500 mg total) by mouth 2 (two) times daily with a meal. For diabetes management 60 tablet 3  . OLANZapine zydis (ZYPREXA) 5 MG disintegrating tablet Take 0.5 tablets (2.5 mg total) by mouth daily. For mood control    . ondansetron (ZOFRAN) 4 MG tablet Take 1 tablet (4 mg total) by mouth every 8 (eight) hours as needed for nausea or vomiting. 20 tablet 1  . trihexyphenidyl (ARTANE) 5 MG tablet      No current facility-administered medications for this visit.    OBJECTIVE:  white woman who appears stated age  83:   05/28/20 1306  BP: (!) 144/90  Pulse: 86  Resp: 20  Temp: 98.7 F (37.1 C)  SpO2: 100%     Body mass index is 40.13 kg/m.   Wt Readings from  Last 3 Encounters:  05/28/20 244 lb 14.4 oz (111.1 kg)  04/25/20 245 lb 9.6 oz (111.4 kg)  04/18/20 247 lb 9.6 oz (112.3 kg)      ECOG FS:1 - Symptomatic but completely ambulatory  Sclerae unicteric, EOMs intact Wearing a mask No cervical or supraclavicular adenopathy Lungs no rales or rhonchi Heart regular rate and rhythm Abd soft, nontender, positive bowel sounds MSK no focal spinal tenderness, no upper extremity lymphedema; she had a mild accident at work where a box hit her right upper arm, without breaking the skin.  There was mild tenderness superior to the medial aspect of the elbow but no palpable hematoma and no bruising no evidence of cellulitis Neuro: nonfocal, well oriented, appropriate affect Breasts: The right breast is status post lumpectomy and radiation currently receiving radiation.  There is erythema particularly  in the inframammary fold, a little bit marked also in the right axilla.  Left breast is benign.  Both axillae are benign.   LAB RESULTS:  CMP     Component Value Date/Time   NA 136 04/18/2020 0805   K 4.1 04/18/2020 0805   CL 99 04/18/2020 0805   CO2 24 04/18/2020 0805   GLUCOSE 267 (H) 04/18/2020 0805   BUN 14 04/18/2020 0805   CREATININE 0.63 04/18/2020 0805   CREATININE 0.80 03/07/2020 0810   CALCIUM 9.5 04/18/2020 0805   PROT 7.8 04/18/2020 0805   ALBUMIN 3.7 04/18/2020 0805   AST 12 (L) 04/18/2020 0805   AST 14 (L) 03/07/2020 0810   ALT 15 04/18/2020 0805   ALT 12 03/07/2020 0810   ALKPHOS 68 04/18/2020 0805   BILITOT 0.3 04/18/2020 0805   BILITOT 0.3 03/07/2020 0810   GFRNONAA >60 04/18/2020 0805   GFRNONAA >60 03/07/2020 0810   GFRAA >60 04/18/2020 0805   GFRAA >60 03/07/2020 0810    No results found for: TOTALPROTELP, ALBUMINELP, A1GS, A2GS, BETS, BETA2SER, GAMS, MSPIKE, SPEI  Lab Results  Component Value Date   WBC 7.9 04/18/2020   NEUTROABS 5.2 04/18/2020   HGB 15.9 (H) 04/18/2020   HCT 47.5 (H) 04/18/2020   MCV 84.5 04/18/2020   PLT 340 04/18/2020    No results found for: LABCA2  No components found for: OACZYS063  No results for input(s): INR in the last 168 hours.  No results found for: LABCA2  No results found for: KZS010  No results found for: XNA355  No results found for: DDU202  No results found for: CA2729  No components found for: HGQUANT  No results found for: CEA1 / No results found for: CEA1   No results found for: AFPTUMOR  No results found for: CHROMOGRNA  No results found for: KPAFRELGTCHN, LAMBDASER, KAPLAMBRATIO (kappa/lambda light chains)  No results found for: HGBA, HGBA2QUANT, HGBFQUANT, HGBSQUAN (Hemoglobinopathy evaluation)   No results found for: LDH  No results found for: IRON, TIBC, IRONPCTSAT (Iron and TIBC)  No results found for: FERRITIN  Urinalysis    Component Value Date/Time   COLORURINE  YELLOW 08/11/2014 0654   APPEARANCEUR CLOUDY (A) 08/11/2014 0654   LABSPEC 1.004 (L) 08/11/2014 0654   PHURINE 7.0 08/11/2014 0654   GLUCOSEU NEGATIVE 08/11/2014 0654   HGBUR NEGATIVE 08/11/2014 0654   BILIRUBINUR NEGATIVE 08/11/2014 0654   KETONESUR NEGATIVE 08/11/2014 0654   PROTEINUR NEGATIVE 08/11/2014 0654   UROBILINOGEN 0.2 08/11/2014 0654   NITRITE NEGATIVE 08/11/2014 0654   LEUKOCYTESUR NEGATIVE 08/11/2014 0654     STUDIES: No results found.   ELIGIBLE FOR AVAILABLE RESEARCH  PROTOCOL: AET  ASSESSMENT: 44 y.o. [place] woman status post right breast upper inner quadrant biopsy 03/01/2020 for a clinical TXN0, stage IA invasive ductal carcinoma, grade 1, estrogen and progesterone receptor positive, HER-2 not amplified, with an MIB-1 of 2%.  (1) genetics testing 03/25/2020 through the   [facility] Breast Cancer STAT Panel or Common Hereditary Cancers Panel found no deleterious mutations in ATM, BRCA1, BRCA2, CDH1, CHEK2, PALB2, PTEN, STK11 and TP53.  The Common Hereditary Cancers Panel offered by [facility] includes sequencing and/or deletion duplication testing of the following 48 genes: APC, ATM, AXIN2, BARD1, BMPR1A, BRCA1, BRCA2, BRIP1, CDH1, CDK4, CDKN2A (p14ARF), CDKN2A (p16INK4a), CHEK2, CTNNA1, DICER1, EPCAM (Deletion/duplication testing only), GREM1 (promoter region deletion/duplication testing only), KIT, MEN1, MLH1, MSH2, MSH3, MSH6, MUTYH, NBN, NF1, NHTL1, PALB2, PDGFRA, PMS2, POLD1, POLE, PTEN, RAD50, RAD51C, RAD51D, RNF43, SDHB, SDHC, SDHD, SMAD4, SMARCA4. STK11, TP53, TSC1, TSC2, and VHL.  The following genes were evaluated for sequence changes only: SDHA and HOXB13 c.251G>A variant only.  (2) MammaPrint obtained from the original breast biopsy read as molecular "low risk", clinical low risk  (3) status post right lumpectomy and sentinel lymph node sampling 04/04/2020 for a pT1c pN0, stage IA invasive ductal carcinoma, grade 1, with negative margins  (a) a total of 4 right  axillary lymph nodes were removed.  (4) adjuvant radiation ongoing to be completed 06/19/2020  (5) to start tamoxifen 07/15/2020  (6) type 2 diabetes mellitus   PLAN: [name] is tolerating radiation well.  She is just about halfway through.  She understands a second half will be more symptomatic, and she will have more skin changes some more fatigue.  The best way to deal with all that is to "push through it" since rest unfortunately does not make 1 feel rested under those circumstances.  Since she finishes radiation early in July I think it would be fine to start tamoxifen 07/15/2020.  We again discussed the possible toxicities side effects and complications of this agent and particularly she understands it is not a contraceptive.  She will have her IUD replaced in August.  I am going to see her in mid September after has been on tamoxifen about 6 weeks just to make sure she is tolerating things well and is recovering from radiation.  If all is going well at that time we will broaden the follow-up interval  She knows to call for any other issue that may develop before the next visit  Total encounter time 35 minutes.Sarajane Jews C. Jasma Seevers, MD 05/28/2020 1:15 PM Medical Oncology and Hematology [facility] [place], [place] [address] Tel. [phone]    Fax. [phone]   This document serves as a record of services personally performed by Lurline Del, MD. It was created on his behalf by Wilburn Mylar, a trained medical scribe. The creation of this record is based on the scribe's personal observations and the provider's statements to them.   I, Lurline Del MD, have reviewed the above documentation for accuracy and completeness, and I agree with the above.   *Total Encounter Time as defined by the Centers for Medicare and Medicaid Services includes, in addition to the face-to-face time of a patient visit (documented in the note above)  non-face-to-face time: obtaining and reviewing outside history, ordering and reviewing medications, tests or procedures, care coordination (communications with other health care professionals or caregivers) and documentation in the medical record.

## 2020-05-28 ENCOUNTER — Inpatient Hospital Stay: Payer: 59 | Attending: Oncology | Admitting: Oncology

## 2020-05-28 ENCOUNTER — Other Ambulatory Visit: Payer: Self-pay

## 2020-05-28 ENCOUNTER — Ambulatory Visit
Admission: RE | Admit: 2020-05-28 | Discharge: 2020-05-28 | Disposition: A | Discharge status: Home / Self Care | Payer: 59 | Source: Ambulatory Visit | Attending: Radiation Oncology | Admitting: Radiation Oncology

## 2020-05-28 VITALS — BP 144/90 | HR 86 | Temp 98.7°F | Resp 20 | Ht 65.5 in | Wt 244.9 lb

## 2020-05-28 DIAGNOSIS — C50211 Malignant neoplasm of upper-inner quadrant of right female breast: Secondary | ICD-10-CM

## 2020-05-28 DIAGNOSIS — E669 Obesity, unspecified: Secondary | ICD-10-CM

## 2020-05-28 DIAGNOSIS — E1169 Type 2 diabetes mellitus with other specified complication: Secondary | ICD-10-CM

## 2020-05-28 DIAGNOSIS — Z170 Estrogen receptor positive status [ER+]: Secondary | ICD-10-CM | POA: Insufficient documentation

## 2020-05-28 DIAGNOSIS — R5383 Other fatigue: Secondary | ICD-10-CM | POA: Insufficient documentation

## 2020-05-28 DIAGNOSIS — Z6841 Body Mass Index (BMI) 40.0 and over, adult: Secondary | ICD-10-CM

## 2020-05-28 DIAGNOSIS — E119 Type 2 diabetes mellitus without complications: Secondary | ICD-10-CM | POA: Insufficient documentation

## 2020-05-28 MED ORDER — TAMOXIFEN CITRATE 20 MG PO TABS: 20.0000 mg | ORAL_TABLET | Freq: Every day | ORAL | 4 refills | Status: AC

## 2020-05-28 MED ORDER — METFORMIN HCL 500 MG PO TABS: 500.0000 mg | ORAL_TABLET | Freq: Two times a day (BID) | ORAL | 3 refills | Status: DC

## 2020-05-29 ENCOUNTER — Ambulatory Visit
Admission: RE | Admit: 2020-05-29 | Discharge: 2020-05-29 | Disposition: A | Discharge status: Home / Self Care | Payer: 59 | Source: Ambulatory Visit | Attending: Radiation Oncology | Admitting: Radiation Oncology

## 2020-05-29 ENCOUNTER — Other Ambulatory Visit: Payer: Self-pay

## 2020-05-29 ENCOUNTER — Telehealth: Payer: Self-pay | Admitting: Oncology

## 2020-05-29 DIAGNOSIS — C50211 Malignant neoplasm of upper-inner quadrant of right female breast: Secondary | ICD-10-CM | POA: Diagnosis not present

## 2020-05-29 NOTE — Telephone Encounter (Signed)
Scheduled appts per 6/14 los. Left voicemail with appt date and time.

## 2020-05-30 ENCOUNTER — Ambulatory Visit
Admission: RE | Admit: 2020-05-30 | Discharge: 2020-05-30 | Disposition: A | Discharge status: Home / Self Care | Payer: 59 | Source: Ambulatory Visit | Attending: Radiation Oncology | Admitting: Radiation Oncology

## 2020-05-30 ENCOUNTER — Other Ambulatory Visit: Payer: Self-pay

## 2020-05-30 DIAGNOSIS — C50211 Malignant neoplasm of upper-inner quadrant of right female breast: Secondary | ICD-10-CM | POA: Diagnosis not present

## 2020-05-31 ENCOUNTER — Ambulatory Visit
Admission: RE | Admit: 2020-05-31 | Discharge: 2020-05-31 | Disposition: A | Discharge status: Home / Self Care | Payer: 59 | Source: Ambulatory Visit | Attending: Radiation Oncology | Admitting: Radiation Oncology

## 2020-05-31 ENCOUNTER — Other Ambulatory Visit: Payer: Self-pay

## 2020-05-31 DIAGNOSIS — C50211 Malignant neoplasm of upper-inner quadrant of right female breast: Secondary | ICD-10-CM | POA: Diagnosis not present

## 2020-06-01 ENCOUNTER — Other Ambulatory Visit: Payer: Self-pay

## 2020-06-01 ENCOUNTER — Ambulatory Visit
Admission: RE | Admit: 2020-06-01 | Discharge: 2020-06-01 | Disposition: A | Discharge status: Home / Self Care | Payer: 59 | Source: Ambulatory Visit | Attending: Radiation Oncology | Admitting: Radiation Oncology

## 2020-06-01 DIAGNOSIS — C50211 Malignant neoplasm of upper-inner quadrant of right female breast: Secondary | ICD-10-CM | POA: Diagnosis not present

## 2020-06-04 ENCOUNTER — Other Ambulatory Visit: Payer: Self-pay

## 2020-06-04 ENCOUNTER — Ambulatory Visit
Admission: RE | Admit: 2020-06-04 | Discharge: 2020-06-04 | Disposition: A | Discharge status: Home / Self Care | Payer: 59 | Source: Ambulatory Visit | Attending: Radiation Oncology | Admitting: Radiation Oncology

## 2020-06-04 DIAGNOSIS — C50211 Malignant neoplasm of upper-inner quadrant of right female breast: Secondary | ICD-10-CM | POA: Diagnosis not present

## 2020-06-05 ENCOUNTER — Other Ambulatory Visit: Payer: Self-pay

## 2020-06-05 ENCOUNTER — Ambulatory Visit
Admission: RE | Admit: 2020-06-05 | Discharge: 2020-06-05 | Disposition: A | Discharge status: Home / Self Care | Payer: 59 | Source: Ambulatory Visit | Attending: Radiation Oncology | Admitting: Radiation Oncology

## 2020-06-05 DIAGNOSIS — C50211 Malignant neoplasm of upper-inner quadrant of right female breast: Secondary | ICD-10-CM | POA: Diagnosis not present

## 2020-06-06 ENCOUNTER — Ambulatory Visit
Admission: RE | Admit: 2020-06-06 | Discharge: 2020-06-06 | Disposition: A | Discharge status: Home / Self Care | Payer: 59 | Source: Ambulatory Visit | Attending: Radiation Oncology | Admitting: Radiation Oncology

## 2020-06-06 ENCOUNTER — Other Ambulatory Visit: Payer: Self-pay

## 2020-06-06 DIAGNOSIS — C50211 Malignant neoplasm of upper-inner quadrant of right female breast: Secondary | ICD-10-CM | POA: Diagnosis not present

## 2020-06-07 ENCOUNTER — Ambulatory Visit
Admission: RE | Admit: 2020-06-07 | Discharge: 2020-06-07 | Disposition: A | Discharge status: Home / Self Care | Payer: 59 | Source: Ambulatory Visit | Attending: Radiation Oncology | Admitting: Radiation Oncology

## 2020-06-07 ENCOUNTER — Other Ambulatory Visit: Payer: Self-pay

## 2020-06-07 DIAGNOSIS — C50211 Malignant neoplasm of upper-inner quadrant of right female breast: Secondary | ICD-10-CM | POA: Diagnosis not present

## 2020-06-08 ENCOUNTER — Other Ambulatory Visit: Payer: Self-pay

## 2020-06-08 ENCOUNTER — Ambulatory Visit
Admission: RE | Admit: 2020-06-08 | Discharge: 2020-06-08 | Disposition: A | Discharge status: Home / Self Care | Payer: 59 | Source: Ambulatory Visit | Attending: Radiation Oncology | Admitting: Radiation Oncology

## 2020-06-08 DIAGNOSIS — C50211 Malignant neoplasm of upper-inner quadrant of right female breast: Secondary | ICD-10-CM | POA: Diagnosis not present

## 2020-06-11 ENCOUNTER — Ambulatory Visit
Admission: RE | Admit: 2020-06-11 | Discharge: 2020-06-11 | Disposition: A | Discharge status: Home / Self Care | Payer: 59 | Source: Ambulatory Visit | Attending: Radiation Oncology | Admitting: Radiation Oncology

## 2020-06-11 ENCOUNTER — Other Ambulatory Visit: Payer: Self-pay

## 2020-06-11 DIAGNOSIS — C50211 Malignant neoplasm of upper-inner quadrant of right female breast: Secondary | ICD-10-CM | POA: Diagnosis not present

## 2020-06-12 ENCOUNTER — Ambulatory Visit
Admission: RE | Admit: 2020-06-12 | Discharge: 2020-06-12 | Disposition: A | Discharge status: Home / Self Care | Payer: 59 | Source: Ambulatory Visit | Attending: Radiation Oncology | Admitting: Radiation Oncology

## 2020-06-12 ENCOUNTER — Other Ambulatory Visit: Payer: Self-pay

## 2020-06-12 DIAGNOSIS — C50211 Malignant neoplasm of upper-inner quadrant of right female breast: Secondary | ICD-10-CM | POA: Diagnosis not present

## 2020-06-13 ENCOUNTER — Other Ambulatory Visit: Payer: Self-pay

## 2020-06-13 ENCOUNTER — Ambulatory Visit
Admission: RE | Admit: 2020-06-13 | Discharge: 2020-06-13 | Disposition: A | Discharge status: Home / Self Care | Payer: 59 | Source: Ambulatory Visit | Attending: Radiation Oncology | Admitting: Radiation Oncology

## 2020-06-13 DIAGNOSIS — C50211 Malignant neoplasm of upper-inner quadrant of right female breast: Secondary | ICD-10-CM | POA: Diagnosis not present

## 2020-06-14 ENCOUNTER — Ambulatory Visit
Admission: RE | Admit: 2020-06-14 | Discharge: 2020-06-14 | Disposition: A | Discharge status: Home / Self Care | Payer: 59 | Source: Ambulatory Visit | Attending: Radiation Oncology | Admitting: Radiation Oncology

## 2020-06-14 ENCOUNTER — Other Ambulatory Visit: Payer: Self-pay

## 2020-06-14 DIAGNOSIS — Z170 Estrogen receptor positive status [ER+]: Secondary | ICD-10-CM | POA: Diagnosis present

## 2020-06-14 DIAGNOSIS — C50211 Malignant neoplasm of upper-inner quadrant of right female breast: Secondary | ICD-10-CM | POA: Diagnosis present

## 2020-06-14 DIAGNOSIS — Z510 Encounter for antineoplastic radiation therapy: Secondary | ICD-10-CM | POA: Insufficient documentation

## 2020-06-14 MED ORDER — SONAFINE EX EMUL
1.0000 "application " | Freq: Once | CUTANEOUS | Status: AC
  Administered 2020-06-14: 1 via TOPICAL

## 2020-06-15 ENCOUNTER — Encounter: Payer: Self-pay | Admitting: Radiation Oncology

## 2020-06-15 ENCOUNTER — Ambulatory Visit: Payer: 59

## 2020-06-15 ENCOUNTER — Ambulatory Visit
Admission: RE | Admit: 2020-06-15 | Discharge: 2020-06-15 | Disposition: A | Discharge status: Home / Self Care | Payer: 59 | Source: Ambulatory Visit | Attending: Radiation Oncology | Admitting: Radiation Oncology

## 2020-06-15 DIAGNOSIS — C50211 Malignant neoplasm of upper-inner quadrant of right female breast: Secondary | ICD-10-CM | POA: Diagnosis not present

## 2020-06-19 ENCOUNTER — Encounter: Payer: Self-pay | Admitting: *Deleted

## 2020-06-19 ENCOUNTER — Ambulatory Visit: Payer: 59

## 2020-07-05 ENCOUNTER — Other Ambulatory Visit: Payer: Self-pay

## 2020-07-05 ENCOUNTER — Ambulatory Visit: Payer: 59 | Attending: Surgery | Admitting: Physical Therapy

## 2020-07-05 DIAGNOSIS — Z483 Aftercare following surgery for neoplasm: Secondary | ICD-10-CM

## 2020-07-05 NOTE — Therapy (Signed)
[place], [place], [address] Phone: [phone]   Fax:  [phone]  Physical Therapy Treatment  Patient Details  Name: [name] MRN: [id number] Date of Birth: [birth year] Referring Provider (PT): Dr. [name]   Encounter Date: 07/05/2020   PT End of Session - 07/05/20 1116    Visit Number 2    PT Start Time 0900    PT Stop Time 0920    PT Time Calculation (min) 20 min           Past Medical History:  Diagnosis Date   Bipolar 1 disorder ([place])    Complication of anesthesia    Diabetes mellitus type 2 in obese ([place])    Family history of prostate cancer    Obesity    PONV (postoperative nausea and vomiting)    Seasonal allergies     Past Surgical History:  Procedure Laterality Date   BREAST LUMPECTOMY WITH RADIOACTIVE SEED AND SENTINEL LYMPH NODE BIOPSY Right 04/04/2020   Procedure: RIGHT BREAST LUMPECTOMY WITH RADIOACTIVE SEED AND SENTINEL LYMPH NODE BIOPSY;  Surgeon: [name], MD;  Location: [facility];  Service: General;  Laterality: Right;   OOPHORECTOMY  2009    There were no vitals filed for this visit.   Subjective Assessment - 07/05/20 1115    Subjective Patient here for L-Dex screen                  L-DEX FLOWSHEETS - 07/05/20 1100      L-DEX LYMPHEDEMA SCREENING   Measurement Type Unilateral    L-DEX MEASUREMENT EXTREMITY Upper Extremity    POSITION  Standing    DOMINANT SIDE Right    At Risk Side Right    BASELINE SCORE (UNILATERAL) 1.9                The patient was assessed using the L-Dex machine today to produce a lymphedema index baseline score. The patient will be reassessed on a regular basis (typically every 3 months) to obtain new L-Dex scores. If the score is > 6.5 points away from his/her baseline score indicating onset of subclinical lymphedema, it will be recommended to wear a compression garment  for 4 weeks, 12 hours per day and then be reassessed. If the score continues to be > 6.5 points from baseline at reassessment, we will initiate lymphedema treatment. Assessing in this manner has a 95% rate of preventing clinically significant lymphedema.                    PT Long Term Goals - 04/26/20 1058      PT LONG TERM GOAL #1   Title Patient will demonstrate she has regained full shoulder ROM and function post operatively compared to baselines.    Time 8    Period Weeks    Status Achieved                 Plan - 07/05/20 1116    Clinical Impression Statement Pt continues to be within close range of her baesline L-Dex screen. no intervention needed.           Patient will benefit from skilled therapeutic intervention in order to improve the following deficits and impairments:     Visit Diagnosis: Aftercare following surgery for neoplasm     Problem List Patient Active Problem List   Diagnosis Date Noted   Genetic testing 03/26/2020   Morbid obesity with BMI of  40.0-44.9, adult ([place]) 03/07/2020   Type 2 diabetes mellitus, uncontrolled ([place]) 03/07/2020   Fibroadenoma of right breast 03/07/2020   Family history of prostate cancer    Malignant neoplasm of upper-inner quadrant of right breast in female, estrogen receptor positive ([place]) 03/05/2020   Embedded earring of left ear 08/11/2014   Diabetes mellitus type 2 in obese [facility])    Seasonal allergies    Mood disorder ([place]) 08/08/2014   Bipolar 1 disorder, manic, moderate ([place]) 08/08/2014   Annia Friendly, PT 07/05/20 11:17 AM  [place], [place], [address] Phone: [phone]   Fax:  [phone]  Name: [name] MRN: [id number] Date of Birth: 1976-11-03

## 2020-07-11 ENCOUNTER — Telehealth: Payer: Self-pay | Admitting: Radiation Oncology

## 2020-07-11 NOTE — Telephone Encounter (Signed)
See note from today

## 2020-07-11 NOTE — Telephone Encounter (Signed)
  Radiation Oncology         (336) [phone] ________________________________  Name: [name] MRN: [id number]  Date of Service: 07/11/2020  DOB: [birth year]  Post Treatment Telephone Note  Diagnosis:   Stage IA, cT1cN0M0 grade 1 ER/PR positive invasive ductal carcinoma of the right breast.  Interval Since Last Radiation: 4 weeks   05/02/20-06/15/20: The right breast was treated to 50.4 Gy in 28 fractions, followed by a 10 Gy boost in 5 fractions.  Narrative:  The patient was contacted today for routine follow-up. During treatment she did very well with radiotherapy and did not have significant desquamation.    Impression/Plan: 1. Stage IA, cT1cN0M0 grade 1 ER/PR positive invasive ductal carcinoma of the right breast. I called but had to leave a message for the patient. On the voicemail, I discussed that we would be happy to continue to follow her as needed, but she will also continue to follow up with Dr. Jana Hakim in medical oncology. She was counseled on skin care as well as measures to avoid sun exposure to this area.      Carola Rhine, PAC

## 2020-07-16 ENCOUNTER — Other Ambulatory Visit: Payer: Self-pay | Admitting: *Deleted

## 2020-07-16 MED ORDER — TAMOXIFEN CITRATE 20 MG PO TABS: 20.0000 mg | ORAL_TABLET | Freq: Every day | ORAL | 4 refills | Status: DC

## 2020-07-20 NOTE — Progress Notes (Signed)
  Radiation Oncology         (336) [phone] ________________________________  Name: [name] MRN: [id number]  Date: 06/15/2020  DOB: [birth year]  End of Treatment Note  Diagnosis:   right-sided breast cancer     Indication for treatment:  Curative       Radiation treatment dates:   05/02/20 - 06/15/20  Site/dose:   The patient initially received a dose of 50.4 Gy in 28 fractions to the breast using whole-breast tangent fields. This was delivered using a 3-D conformal technique. The patient then received a boost to the seroma. This delivered an additional 10 Gy in 5 fractions using a electron technique. The total dose was 60.4 Gy.  Narrative: The patient tolerated radiation treatment relatively well.   The patient had some expected skin irritation as she progressed during treatment.   Plan: The patient has completed radiation treatment. The patient will return to radiation oncology clinic for routine followup in one month. I advised the patient to call or return sooner if they have any questions or concerns related to their recovery or treatment. ________________________________  Jodelle Gross, M.D., Ph.D.

## 2020-08-29 ENCOUNTER — Inpatient Hospital Stay: Payer: 59 | Admitting: Oncology

## 2020-08-29 ENCOUNTER — Inpatient Hospital Stay: Payer: 59

## 2020-08-31 ENCOUNTER — Other Ambulatory Visit: Payer: Self-pay

## 2020-08-31 ENCOUNTER — Inpatient Hospital Stay: Payer: 59 | Attending: Oncology

## 2020-08-31 ENCOUNTER — Encounter: Payer: Self-pay | Admitting: Adult Health

## 2020-08-31 ENCOUNTER — Inpatient Hospital Stay (HOSPITAL_BASED_OUTPATIENT_CLINIC_OR_DEPARTMENT_OTHER): Payer: 59 | Admitting: Adult Health

## 2020-08-31 VITALS — BP 128/89 | HR 96 | Temp 98.1°F | Resp 18 | Ht 65.5 in | Wt 235.1 lb

## 2020-08-31 DIAGNOSIS — Z87891 Personal history of nicotine dependence: Secondary | ICD-10-CM | POA: Insufficient documentation

## 2020-08-31 DIAGNOSIS — Z7984 Long term (current) use of oral hypoglycemic drugs: Secondary | ICD-10-CM | POA: Diagnosis not present

## 2020-08-31 DIAGNOSIS — N951 Menopausal and female climacteric states: Secondary | ICD-10-CM | POA: Insufficient documentation

## 2020-08-31 DIAGNOSIS — E1169 Type 2 diabetes mellitus with other specified complication: Secondary | ICD-10-CM

## 2020-08-31 DIAGNOSIS — Z170 Estrogen receptor positive status [ER+]: Secondary | ICD-10-CM

## 2020-08-31 DIAGNOSIS — Z923 Personal history of irradiation: Secondary | ICD-10-CM | POA: Diagnosis not present

## 2020-08-31 DIAGNOSIS — C50211 Malignant neoplasm of upper-inner quadrant of right female breast: Secondary | ICD-10-CM

## 2020-08-31 DIAGNOSIS — R519 Headache, unspecified: Secondary | ICD-10-CM | POA: Insufficient documentation

## 2020-08-31 DIAGNOSIS — E11649 Type 2 diabetes mellitus with hypoglycemia without coma: Secondary | ICD-10-CM

## 2020-08-31 DIAGNOSIS — E119 Type 2 diabetes mellitus without complications: Secondary | ICD-10-CM

## 2020-08-31 DIAGNOSIS — D241 Benign neoplasm of right breast: Secondary | ICD-10-CM

## 2020-08-31 DIAGNOSIS — E669 Obesity, unspecified: Secondary | ICD-10-CM

## 2020-08-31 LAB — COMPREHENSIVE METABOLIC PANEL
ALT: 15 U/L (ref 0–44)
AST: 13 U/L — ABNORMAL LOW (ref 15–41)
Albumin: 3.5 g/dL (ref 3.5–5.0)
Alkaline Phosphatase: 54 U/L (ref 38–126)
Anion gap: 12 (ref 5–15)
BUN: 7 mg/dL (ref 6–20)
CO2: 27 mmol/L (ref 22–32)
Calcium: 9.3 mg/dL (ref 8.9–10.3)
Chloride: 98 mmol/L (ref 98–111)
Creatinine, Ser: 0.69 mg/dL (ref 0.44–1.00)
GFR calc Af Amer: >60 mL/min (ref >60)
GFR calc non Af Amer: >60 mL/min (ref >60)
Glucose, Bld: 276 mg/dL — ABNORMAL HIGH (ref 70–99)
Potassium: 4.5 mmol/L (ref 3.5–5.1)
Sodium: 137 mmol/L (ref 135–145)
Total Bilirubin: 0.2 mg/dL — ABNORMAL LOW (ref 0.3–1.2)
Total Protein: 7.4 g/dL (ref 6.5–8.1)

## 2020-08-31 LAB — CBC WITH DIFFERENTIAL/PLATELET
Abs Immature Granulocytes: 0.03 10*3/uL (ref 0.00–0.07)
Basophils Absolute: 0 10*3/uL (ref 0.0–0.1)
Basophils Relative: 0 %
Eosinophils Absolute: 0.1 10*3/uL (ref 0.0–0.5)
Eosinophils Relative: 2 %
HCT: 43.3 % (ref 36.0–46.0)
Hemoglobin: 14.3 g/dL (ref 12.0–15.0)
Immature Granulocytes: 0 %
Lymphocytes Relative: 25 %
Lymphs Abs: 1.8 10*3/uL (ref 0.7–4.0)
MCH: 27.4 pg (ref 26.0–34.0)
MCHC: 33 g/dL (ref 30.0–36.0)
MCV: 83.1 fL (ref 80.0–100.0)
Monocytes Absolute: 0.4 10*3/uL (ref 0.1–1.0)
Monocytes Relative: 6 %
Neutro Abs: 4.8 10*3/uL (ref 1.7–7.7)
Neutrophils Relative %: 67 %
Platelets: 287 10*3/uL (ref 150–400)
RBC: 5.21 MIL/uL — ABNORMAL HIGH (ref 3.87–5.11)
RDW: 12.7 % (ref 11.5–15.5)
WBC: 7.2 10*3/uL (ref 4.0–10.5)
nRBC: 0 % (ref 0.0–0.2)

## 2020-08-31 MED ORDER — TAMOXIFEN CITRATE 20 MG PO TABS: 20.0000 mg | ORAL_TABLET | Freq: Every day | ORAL | 4 refills | Status: DC

## 2020-08-31 MED ORDER — METFORMIN HCL 500 MG PO TABS: 500.0000 mg | ORAL_TABLET | Freq: Two times a day (BID) | ORAL | 4 refills | Status: DC

## 2020-08-31 NOTE — Progress Notes (Signed)
[place]  [phone] [phone]     ID: [name] DOB: 08/24/1976  MR#: [id number]  XFG#:[id number]  Patient Care Team: Precious Gilding, [place] as PCP - General (Physician Assistant) System, Provider Not In Thurnell Lose, MD as Consulting Physician (Obstetrics and Gynecology) Mauro Kaufmann, RN as Oncology Nurse Navigator Rockwell Germany, RN as Oncology Nurse Navigator [name], MD as Consulting Physician (General Surgery) Magrinat, Virgie Dad, MD as Consulting Physician (Oncology) Kyung Rudd, MD as Consulting Physician (Radiation Oncology) Scot Dock, NP OTHER MD: Pauline Good, NP (psychiatry)   CHIEF COMPLAINT: estrogen receptor positive breast cancer  CURRENT TREATMENT: Adjuvant radiation to be followed by tamoxifen   INTERVAL HISTORY: [name] returns today for follow up of her estrogen receptor positive breast cancer.  Since her last visit she has completed adjuvant radiation therapy, and started taking Tamoxifen daily.  She notes she is having moderate hot flashes.  These cause her to sweat.  She has difficulty with being able to drink water during the day at work, so she takes a salt tab in the mornings before she goes so she will not get significant c4ramps.  She has also started having headaches weekly.  She first noted this when starting the Tamoxifen.   REVIEW OF SYSTEMS: [name] has been feeling moderately well otherwise.  She is drinking 80 ounces of water per day, one gatorade zero, had her most recent eye exam a year ago and drinks two caffeinated beverages per day. She is working on losing weight and has lost 12 pounds since May, 2021.  She does note a decreased appetite and reports that she sleeps well.    [name] is healing from radiation therapy and has no new breast concerns.  She denies fever, chills, chest pain, palpitations, cough, shortness of breath, headaches, vision issues or any other concerns.  A detailed  ROS was otherwise non contributory today.     HISTORY OF CURRENT ILLNESS: From the original intake note:  "[name]" had routine screening mammography on 02/07/2019 showing a possible abnormality in the right breast. She underwent right diagnostic mammography with tomography and right breast ultrasonography at [facility] on 02/23/2019 showing: breast density category B; probably-benign 0.7 cm mass in right breast at 9 o'clock, likely a fibroadenoma. She returned for short-term right breast ultrasound on 08/29/2019, which showed stability of the probably-benign mass.  She returned for her annual mammography and underwent bilateral diagnostic mammography with tomography and right breast ultrasonography at [facility] on 02/27/2020 showing: breast density category B; new area of distortion in medial inferior right breast; this could not be demonstrated on ultrasound.  There was a stable probably-benign right breast mass at 9 o'clock; no evidence of left breast malignancy or lymphadenopathy.  Accordingly on 03/01/2020 she proceeded to biopsy of the right breast area in question. The pathology from this procedure ([id number]) showed: invasive ductal carcinoma, grade 1; adenosis with calcifications. Prognostic indicators significant for: estrogen receptor, 95% positive with moderate staining intensity and progesterone receptor, 95% positive with strong staining intensity. Proliferation marker Ki67 at 2%. HER2 negative by immunohistochemistry (1+).  Biopsy of the probably-benign mass was performed on 03/05/2020. 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  The patient's subsequent history is as detailed below.   PAST MEDICAL HISTORY: Past Medical History:  Diagnosis Date  . Bipolar 1 disorder ([place])   . Complication of anesthesia   . Diabetes mellitus type 2 in obese ([place])   . Family  history of prostate cancer   . Obesity   . PONV (postoperative nausea and vomiting)   . Seasonal  allergies     PAST SURGICAL HISTORY: Past Surgical History:  Procedure Laterality Date  . BREAST LUMPECTOMY WITH RADIOACTIVE SEED AND SENTINEL LYMPH NODE BIOPSY Right 04/04/2020   Procedure: RIGHT BREAST LUMPECTOMY WITH RADIOACTIVE SEED AND SENTINEL LYMPH NODE BIOPSY;  Surgeon: [name], MD;  Location: [facility];  Service: General;  Laterality: Right;  . OOPHORECTOMY  2009    FAMILY HISTORY: Family History  Problem Relation Age of Onset  . Heart disease Other   . Macular degeneration Other   . Mental illness Other        both sides of family  . Prostate cancer Paternal Uncle 52  . Immunodeficiency Maternal Aunt   . Dementia Maternal Grandmother   . Diabetes Maternal Aunt   Her father is age 64 and her mother 22 as of 02/2020.  The patient has 3 brothers and 1 sister. She reports prostate cancer in a paternal uncle at age 30.  There is a family history of ovarian cancer.  She denies a family history of breast or pancreatic cancer.   GYNECOLOGIC HISTORY:  No LMP recorded. (Menstrual status: Other). Menarche: 44 years old [name] 0 LMP 02/2020, regular and lasts 5-7 days Contraceptive: used since 2009, will switch to IUD with cancer diagnosis HRT n/a  Hysterectomy? no BSO? 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 Husband [name] "[name]" is a Administrator.  At home is just the 2 of them plus a cat.  They recently moved to a townhouse with a lot of stairs and that is helping her exercise.  She is not a Designer, fashion/clothing.    ADVANCED DIRECTIVES: In the absence of any documentation to the contrary, the patient's spouse is their HCPOA.    HEALTH MAINTENANCE: Social History   Tobacco Use  . Smoking status: Former Research scientist (life sciences)  . Smokeless tobacco: Never Used  Substance Use Topics  . Alcohol use: Yes    Comment: social  .  Drug use: No     Colonoscopy: n/a (age)  PAP: 12/2018, negative  Bone density: n/a (age)   No Known Allergies  Current Outpatient Medications  Medication Sig Dispense Refill  . ALPRAZolam (XANAX) 0.25 MG tablet Take 1 tablet (0.25 mg) two times daily as needed for anxiety 30 tablet 0  . divalproex (DEPAKOTE ER) 500 MG 24 hr tablet Take 1 tablet (500 mg total) by mouth 2 (two) times daily. For mood stabilization 60 tablet 0  . metFORMIN (GLUCOPHAGE) 500 MG tablet Take 1 tablet (500 mg total) by mouth 2 (two) times daily with a meal. For diabetes management 180 tablet 4  . OLANZapine zydis (ZYPREXA) 5 MG disintegrating tablet Take 0.5 tablets (2.5 mg total) by mouth daily. For mood control    . tamoxifen (NOLVADEX) 20 MG tablet Take 1 tablet (20 mg total) by mouth daily. 90 tablet 4  . trihexyphenidyl (ARTANE) 5 MG tablet     . benztropine (COGENTIN) 1 MG tablet Take 1 tablet (1 mg total) by mouth 2 (two) times daily. For prevention of drug induced involuntary movement (Patient not taking: Reported on 08/31/2020) 60 tablet 0  . ondansetron (ZOFRAN) 4 MG tablet Take 1 tablet (4 mg total) by mouth every 8 (eight) hours as needed  for nausea or vomiting. (Patient not taking: Reported on 08/31/2020) 20 tablet 1   No current facility-administered medications for this visit.    OBJECTIVE:  white woman who appears stated age  16:   08/31/20 1128  BP: 128/89  Pulse: 96  Resp: 18  Temp: 98.1 F (36.7 C)  SpO2: 99%     Body mass index is 38.53 kg/m.   Wt Readings from Last 3 Encounters:  08/31/20 235 lb 1.6 oz (106.6 kg)  05/28/20 244 lb 14.4 oz (111.1 kg)  04/25/20 245 lb 9.6 oz (111.4 kg)      ECOG FS:1 - Symptomatic but completely ambulatory GENERAL: Patient is a well appearing female in no acute distress HEENT:  Sclerae anicteric.  Oropharynx clear and moist. No ulcerations or evidence of oropharyngeal candidiasis. Neck is supple.  NODES:  No cervical, supraclavicular, or axillary  lymphadenopathy palpated.  BREAST EXAM:  Breast inspected only, right breast s/Mcneil lumpectomy, s/Mcneil radiation, healing well, slight erythema and radiation changes noted, no sign of local recurrence LUNGS:  Clear to auscultation bilaterally.  No wheezes or rhonchi. HEART:  Regular rate and rhythm. No murmur appreciated. ABDOMEN:  Soft, nontender.  Positive, normoactive bowel sounds. No organomegaly palpated. MSK:  No focal spinal tenderness to palpation. Full range of motion bilaterally in the upper extremities. EXTREMITIES:  No peripheral edema.   SKIN:  Clear with no obvious rashes or skin changes. No nail dyscrasia. NEURO:  Nonfocal. Well oriented.  Appropriate affect.    LAB RESULTS:  CMP     Component Value Date/Time   NA 137 08/31/2020 1104   K 4.5 08/31/2020 1104   CL 98 08/31/2020 1104   CO2 27 08/31/2020 1104   GLUCOSE 276 (H) 08/31/2020 1104   BUN 7 08/31/2020 1104   CREATININE 0.69 08/31/2020 1104   CREATININE 0.80 03/07/2020 0810   CALCIUM 9.3 08/31/2020 1104   PROT 7.4 08/31/2020 1104   ALBUMIN 3.5 08/31/2020 1104   AST 13 (L) 08/31/2020 1104   AST 14 (L) 03/07/2020 0810   ALT 15 08/31/2020 1104   ALT 12 03/07/2020 0810   ALKPHOS 54 08/31/2020 1104   BILITOT 0.2 (L) 08/31/2020 1104   BILITOT 0.3 03/07/2020 0810   GFRNONAA >60 08/31/2020 1104   GFRNONAA >60 03/07/2020 0810   GFRAA >60 08/31/2020 1104   GFRAA >60 03/07/2020 0810    No results found for: TOTALPROTELP, ALBUMINELP, A1GS, A2GS, BETS, BETA2SER, GAMS, MSPIKE, SPEI  Lab Results  Component Value Date   WBC 7.2 08/31/2020   NEUTROABS 4.8 08/31/2020   HGB 14.3 08/31/2020   HCT 43.3 08/31/2020   MCV 83.1 08/31/2020   PLT 287 08/31/2020    No results found for: LABCA2  No components found for: KYHCWC376  No results for input(s): INR in the last 168 hours.  No results found for: LABCA2  No results found for: EGB151  No results found for: VOH607  No results found for: PXT062  No results  found for: CA2729  No components found for: HGQUANT  No results found for: CEA1 / No results found for: CEA1   No results found for: AFPTUMOR  No results found for: CHROMOGRNA  No results found for: KPAFRELGTCHN, LAMBDASER, KAPLAMBRATIO (kappa/lambda light chains)  No results found for: HGBA, HGBA2QUANT, HGBFQUANT, HGBSQUAN (Hemoglobinopathy evaluation)   No results found for: LDH  No results found for: IRON, TIBC, IRONPCTSAT (Iron and TIBC)  No results found for: FERRITIN  Urinalysis    Component Value Date/Time  COLORURINE YELLOW 08/11/2014 0654   APPEARANCEUR CLOUDY (A) 08/11/2014 0654   LABSPEC 1.004 (L) 08/11/2014 0654   PHURINE 7.0 08/11/2014 0654   GLUCOSEU NEGATIVE 08/11/2014 0654   HGBUR NEGATIVE 08/11/2014 0654   BILIRUBINUR NEGATIVE 08/11/2014 0654   KETONESUR NEGATIVE 08/11/2014 0654   PROTEINUR NEGATIVE 08/11/2014 0654   UROBILINOGEN 0.2 08/11/2014 0654   NITRITE NEGATIVE 08/11/2014 0654   LEUKOCYTESUR NEGATIVE 08/11/2014 0654     STUDIES: No results found.   ELIGIBLE FOR AVAILABLE RESEARCH PROTOCOL: AET  ASSESSMENT: 44 y.o. [place] woman status post right breast upper inner quadrant biopsy 03/01/2020 for a clinical TXN0, stage IA invasive ductal carcinoma, grade 1, estrogen and progesterone receptor positive, HER-2 not amplified, with an MIB-1 of 2%.  (1) genetics testing 03/25/2020 through the   [facility] Breast Cancer STAT Panel or Common Hereditary Cancers Panel found no deleterious mutations in ATM, BRCA1, BRCA2, CDH1, CHEK2, PALB2, PTEN, STK11 and TP53.  The Common Hereditary Cancers Panel offered by [facility] includes sequencing and/or deletion duplication testing of the following 48 genes: APC, ATM, AXIN2, BARD1, BMPR1A, BRCA1, BRCA2, BRIP1, CDH1, CDK4, CDKN2A (p14ARF), CDKN2A (p16INK4a), CHEK2, CTNNA1, DICER1, EPCAM (Deletion/duplication testing only), GREM1 (promoter region deletion/duplication testing only), KIT, MEN1, MLH1, MSH2, MSH3, MSH6,  MUTYH, NBN, NF1, NHTL1, PALB2, PDGFRA, PMS2, POLD1, POLE, PTEN, RAD50, RAD51C, RAD51D, RNF43, SDHB, SDHC, SDHD, SMAD4, SMARCA4. STK11, TP53, TSC1, TSC2, and VHL.  The following genes were evaluated for sequence changes only: SDHA and HOXB13 c.251G>A variant only.  (2) MammaPrint obtained from the original breast biopsy read as molecular "low risk", clinical low risk  (3) status post right lumpectomy and sentinel lymph node sampling 04/04/2020 for a pT1c pN0, stage IA invasive ductal carcinoma, grade 1, with negative margins  (a) a total of 4 right axillary lymph nodes were removed.  (4) adjuvant radiation 05/02/2020-06/15/2020: The patient initially received a dose of 50.4 Gy in 28 fractions to the breast using whole-breast tangent fields. This was delivered using a 3-D conformal technique. The patient then received a boost to the seroma. This delivered an additional 10 Gy in 5 fractions using a electron technique. The total dose was 60.4 Gy.  (5) Started Tamoxifen on 07/15/2020  (6) type 2 diabetes mellitus   PLAN: [name] is doing well today.  She has started on tamoxifen and is transitioning to being on it.  She is noticing some increased headaches.  I cannot find any particular etiology of these headaches.  I suggested she keep a headache diary so that we can evaluate if there is any specific pattern to her headaches.    I congratulated Heidee on her weight loss.  That is great and hard work and she should be proud.   Jaylise and I reviewed that she should continue taking Tamoxifen daily and perhaps change to taking it at night to see if that helps her hot flashes, that she should continue healthy diet and exercise.  For her headaches, she is having no red flag symptoms.  She will keep a headache diary and let me know if they get worse.  She and I talked about healthy diet and exercise.    We will see Tula back in 2-3 months for her survivorship care plan visit, and in 6 months for her mammogram  and f/u with Dr. Jana Hakim.  She knows to call for any questions that may arise between now and her next appointment.  We are happy to see her sooner if needed.   Total encounter time  30 minutes.Wilber Bihari, NP 08/31/20 5:17 PM Medical Oncology and Hematology [facility] [place], [place] [address] Tel. [phone]    Fax. [phone]   *Total Encounter Time as defined by the Centers for Medicare and Medicaid Services includes, in addition to the face-to-face time of a patient visit (documented in the note above) non-face-to-face time: obtaining and reviewing outside history, ordering and reviewing medications, tests or procedures, care coordination (communications with other health care professionals or caregivers) and documentation in the medical record.

## 2020-09-03 ENCOUNTER — Telehealth: Payer: Self-pay | Admitting: Adult Health

## 2020-09-03 NOTE — Telephone Encounter (Signed)
Scheduled appts per 9/17 los. Left voicemail with appt dates and times.

## 2020-09-24 ENCOUNTER — Ambulatory Visit: Payer: 59 | Attending: Surgery

## 2020-10-26 ENCOUNTER — Telehealth: Payer: Self-pay | Admitting: Adult Health

## 2020-10-26 NOTE — Telephone Encounter (Signed)
Called pt per 11/12 sch message to reschedule - left message for patient to call abck to reschedule appt.

## 2020-11-19 NOTE — Progress Notes (Deleted)
SURVIVORSHIP VIRTUAL VISIT:  I connected with *** on 11/19/20 at  9:00 AM EST by *** and verified that I am speaking with the correct person using two identifiers.  I discussed the limitations, risks, security and privacy concerns of performing an evaluation and management service by telephone and the availability of in person appointments. I also discussed with the patient that there may be a patient responsible charge related to this service. The patient expressed understanding and agreed to proceed.   BRIEF ONCOLOGIC HISTORY:  Oncology History  Malignant neoplasm of upper-inner quadrant of right breast in female, estrogen receptor positive ([place])  03/01/2020 Oncotype testing   MammaPrint low risk.   03/05/2020 Initial Diagnosis   Malignant neoplasm of upper-inner quadrant of right breast in female, estrogen receptor positive ([place])   03/25/2020 Genetic Testing   Negative genetic testing:  No pathogenic variants detected on the [facility] Breast Cancer STAT Panel or Common Hereditary Cancers Panel. The report date is 03/25/2020.  The Breast Cancer STAT Panel offered by [facility] includes sequencing and deletion/duplication analysis for the following 9 genes:  ATM, BRCA1, BRCA2, CDH1, CHEK2, PALB2, PTEN, STK11 and TP53.  The Common Hereditary Cancers Panel offered by [facility] includes sequencing and/or deletion duplication testing of the following 48 genes: APC, ATM, AXIN2, BARD1, BMPR1A, BRCA1, BRCA2, BRIP1, CDH1, CDK4, CDKN2A (p14ARF), CDKN2A (p16INK4a), CHEK2, CTNNA1, DICER1, EPCAM (Deletion/duplication testing only), GREM1 (promoter region deletion/duplication testing only), KIT, MEN1, MLH1, MSH2, MSH3, MSH6, MUTYH, NBN, NF1, NHTL1, PALB2, PDGFRA, PMS2, POLD1, POLE, PTEN, RAD50, RAD51C, RAD51D, RNF43, SDHB, SDHC, SDHD, SMAD4, SMARCA4. STK11, TP53, TSC1, TSC2, and VHL.  The following genes were evaluated for sequence changes only: SDHA and HOXB13 c.251G>A variant only.   04/04/2020 Cancer Staging    Staging form: Breast, AJCC 8th Edition - Pathologic stage from 04/04/2020: Stage IA (pT1c, pN0, cM0, G1, ER+, PR+, HER2-)   04/04/2020 Surgery   Right lumpectomy Ninfa Linden) [phone]): IDC, grade 1, with intermediate grade DCIS. Negative margins. 4 axillary lymph nodes were negative.   05/02/2020 - 06/15/2020 Radiation Therapy   The patient initially received a dose of 50.4 Gy in 28 fractions to the breast using whole-breast tangent fields. This was delivered using a 3-D conformal technique. The patient then received a boost to the seroma. This delivered an additional 10 Gy in 5 fractions using a electron technique. The total dose was 60.4 Gy.   07/2020 -  Anti-estrogen oral therapy   Tamoxifen     INTERVAL HISTORY:  Ms. Pociask to review her survivorship care plan detailing her treatment course for breast cancer, as well as monitoring long-term side effects of that treatment, education regarding health maintenance, screening, and overall wellness and health promotion.     Overall, Ms. Awadallah reports feeling quite well   REVIEW OF SYSTEMS:  Review of Systems - Oncology Breast: Denies any new nodularity, masses, tenderness, nipple changes, or nipple discharge.      ONCOLOGY TREATMENT TEAM:  1. Surgeon:  Dr. Marland Kitchen at [facility] 2. Medical Oncologist: Dr. Marland Kitchen  3. Radiation Oncologist: Dr. Marland Kitchen    PAST MEDICAL/SURGICAL HISTORY:  Past Medical History:  Diagnosis Date  . Bipolar 1 disorder ([place])   . Complication of anesthesia   . Diabetes mellitus type 2 in obese ([place])   . Family history of prostate cancer   . Obesity   . PONV (postoperative nausea and vomiting)   . Seasonal allergies    Past Surgical History:  Procedure Laterality Date  . BREAST LUMPECTOMY WITH  RADIOACTIVE SEED AND SENTINEL LYMPH NODE BIOPSY Right 04/04/2020   Procedure: RIGHT BREAST LUMPECTOMY WITH RADIOACTIVE SEED AND SENTINEL LYMPH NODE BIOPSY;  Surgeon: [name], MD;  Location: [place];  Service: General;  Laterality: Right;  . OOPHORECTOMY  2009     ALLERGIES:  No Known Allergies   CURRENT MEDICATIONS:  Outpatient Encounter Medications as of 11/21/2020  Medication Sig  . ALPRAZolam (XANAX) 0.25 MG tablet Take 1 tablet (0.25 mg) two times daily as needed for anxiety  . benztropine (COGENTIN) 1 MG tablet Take 1 tablet (1 mg total) by mouth 2 (two) times daily. For prevention of drug induced involuntary movement (Patient not taking: Reported on 08/31/2020)  . divalproex (DEPAKOTE ER) 500 MG 24 hr tablet Take 1 tablet (500 mg total) by mouth 2 (two) times daily. For mood stabilization  . metFORMIN (GLUCOPHAGE) 500 MG tablet Take 1 tablet (500 mg total) by mouth 2 (two) times daily with a meal. For diabetes management  . OLANZapine zydis (ZYPREXA) 5 MG disintegrating tablet Take 0.5 tablets (2.5 mg total) by mouth daily. For mood control  . ondansetron (ZOFRAN) 4 MG tablet Take 1 tablet (4 mg total) by mouth every 8 (eight) hours as needed for nausea or vomiting. (Patient not taking: Reported on 08/31/2020)  . tamoxifen (NOLVADEX) 20 MG tablet Take 1 tablet (20 mg total) by mouth daily.  . trihexyphenidyl (ARTANE) 5 MG tablet    No facility-administered encounter medications on file as of 11/21/2020.     ONCOLOGIC FAMILY HISTORY:  Family History  Problem Relation Age of Onset  . Heart disease Other   . Macular degeneration Other   . Mental illness Other        both sides of family  . Prostate cancer Paternal Uncle 37  . Immunodeficiency Maternal Aunt   . Dementia Maternal Grandmother   . Diabetes Maternal Aunt      GENETIC COUNSELING/TESTING: ***  SOCIAL HISTORY:  Social History   Socioeconomic History  . Marital status: Married    Spouse name: Not on file  . Number of children: Not on file  . Years of education: Not on file  . Highest education level: Not on file  Occupational History  . Not on file  Tobacco Use  . Smoking status:  Former Research scientist (life sciences)  . Smokeless tobacco: Never Used  Substance and Sexual Activity  . Alcohol use: Yes    Comment: social  . Drug use: No  . Sexual activity: Yes    Birth control/protection: Pill  Other Topics Concern  . Not on file  Social History Narrative   Works   Scientist, physiological Strain:   . Difficulty of Paying Living Expenses: Not on file  Food Insecurity:   . Worried About Charity fundraiser in the Last Year: Not on file  . Ran Out of Food in the Last Year: Not on file  Transportation Needs:   . Lack of Transportation (Medical): Not on file  . Lack of Transportation (Non-Medical): Not on file  Physical Activity:   . Days of Exercise per Week: Not on file  . Minutes of Exercise per Session: Not on file  Stress:   . Feeling of Stress : Not on file  Social Connections:   . Frequency of Communication with Friends and Family: Not on file  . Frequency of Social Gatherings with Friends and Family: Not on file  . Attends Religious Services: Not on file  .  Active Member of Clubs or Organizations: Not on file  . Attends Archivist Meetings: Not on file  . Marital Status: Not on file  Intimate Partner Violence:   . Fear of Current or Ex-Partner: Not on file  . Emotionally Abused: Not on file  . Physically Abused: Not on file  . Sexually Abused: Not on file     OBSERVATIONS/OBJECTIVE:   LABORATORY DATA:  None for this visit.  DIAGNOSTIC IMAGING:  None for this visit.      ASSESSMENT AND PLAN:  Ms.. Klas is a pleasant 44 y.o. female with Stage *** right/left breast invasive ductal carcinoma, ER+/PR+/HER2-, diagnosed in ***, treated with lumpectomy, adjuvant radiation therapy, and anti-estrogen therapy with *** beginning in ***.  She presents to the Survivorship Clinic for our initial meeting and routine follow-up post-completion of treatment for breast cancer.    1. Stage *** right/left breast cancer:  Ms. Bakula is  continuing to recover from definitive treatment for breast cancer. She will follow-up with her medical oncologist, Dr. Ross Ludwig in *** with history and physical exam per surveillance protocol.  She will continue her anti-estrogen therapy with ***. Thus far, she is tolerating the *** well, with minimal side effects. She was instructed to make Dr. Lindi Adie or myself aware if she begins to experience any worsening side effects of the medication and I could see her back in clinic to help manage those side effects, as needed. Her mammogram is due ***; orders placed today.  Her breast density is category ***. Today, a comprehensive survivorship care plan and treatment summary was reviewed with the patient today detailing her breast cancer diagnosis, treatment course, potential late/long-term effects of treatment, appropriate follow-up care with recommendations for the future, and patient education resources.  A copy of this summary, along with a letter will be sent to the patient's primary care provider via mail/fax/In Basket message after today's visit.    #. Problem(s) at Visit______________  #. Bone health:  Given Ms. Mehlhaff's age/history of breast cancer and her current treatment regimen including anti-estrogen therapy with ***, she is at risk for bone demineralization.  Her last DEXA scan was ***, which showed ***.  In the meantime, she was encouraged to increase her consumption of foods rich in calcium, as well as increase her weight-bearing activities.  She was given education on specific activities to promote bone health.  #. Cancer screening:  Due to Ms. Fredenburg's history and her age, she should receive screening for skin cancers, colon cancer, and gynecologic cancers.  The information and recommendations are listed on the patient's comprehensive care plan/treatment summary and were reviewed in detail with the patient.    #. Health maintenance and wellness promotion: Ms. Puopolo was encouraged to  consume 5-7 servings of fruits and vegetables per day. We reviewed the "Nutrition Rainbow" handout, as well as the handout "Take Control of Your Health and Reduce Your Cancer Risk" from the [place].  She was also encouraged to engage in moderate to vigorous exercise for 30 minutes per day most days of the week. We discussed the [employer] fitness program, which is designed for cancer survivors to help them become more physically fit after cancer treatments.  She was instructed to limit her alcohol consumption and continue to abstain from tobacco use/***was encouraged stop smoking.     #. Support services/counseling: It is not uncommon for this period of the patient's cancer care trajectory to be one of many emotions and stressors.  We  discussed how this can be increasingly difficult during the times of quarantine and social distancing due to the COVID-19 pandemic.   She was given information regarding our available services and encouraged to contact me with any questions or for help enrolling in any of our support group/programs.    Follow up instructions:    -Return to cancer center ***  -Mammogram due in *** -Follow up with surgery *** -She is welcome to return back to the Survivorship Clinic at any time; no additional follow-up needed at this time.  -Consider referral back to survivorship as a long-term survivor for continued surveillance  The patient was provided an opportunity to ask questions and all were answered. The patient agreed with the plan and demonstrated an understanding of the instructions.   The patient was advised to call back or seek an in-person evaluation if the symptoms worsen or if the condition fails to improve as anticipated.   I provided *** minutes of {Blank single:19197::"face-to-face video visit time","non face-to-face telephone visit time"} during this encounter, and > 50% was spent counseling as documented under my assessment & plan.  Scot Dock, NP

## 2020-11-21 ENCOUNTER — Inpatient Hospital Stay: Payer: Self-pay | Admitting: Adult Health

## 2021-01-07 ENCOUNTER — Encounter: Payer: Self-pay | Admitting: Adult Health

## 2021-01-10 ENCOUNTER — Encounter: Payer: Self-pay | Admitting: Adult Health

## 2021-01-10 ENCOUNTER — Other Ambulatory Visit: Payer: Self-pay

## 2021-01-10 ENCOUNTER — Inpatient Hospital Stay: Payer: Managed Care, Other (non HMO) | Attending: Adult Health | Admitting: Adult Health

## 2021-01-10 DIAGNOSIS — C50211 Malignant neoplasm of upper-inner quadrant of right female breast: Secondary | ICD-10-CM | POA: Diagnosis not present

## 2021-01-10 DIAGNOSIS — Z170 Estrogen receptor positive status [ER+]: Secondary | ICD-10-CM | POA: Diagnosis not present

## 2021-01-10 DIAGNOSIS — R519 Headache, unspecified: Secondary | ICD-10-CM

## 2021-01-10 NOTE — Progress Notes (Signed)
SURVIVORSHIP VIRTUAL VISIT:  I connected with [name] on 01/10/21 at  3:30 PM EST by my chart video and verified that I am speaking with the correct person using two identifiers.  I discussed the limitations, risks, security and privacy concerns of performing an evaluation and management service virtually and the availability of in person appointments. I also discussed with the patient that there may be a patient responsible charge related to this service. The patient expressed understanding and agreed to proceed.   Patient location: home Provider [place] office  Others participating on the call: none  BRIEF ONCOLOGIC HISTORY:  Oncology History  Malignant neoplasm of upper-inner quadrant of right breast in female, estrogen receptor positive ([place])  03/01/2020 Oncotype testing   MammaPrint low risk.   03/05/2020 Initial Diagnosis   Malignant neoplasm of upper-inner quadrant of right breast in female, estrogen receptor positive ([place])   03/25/2020 Genetic Testing   Negative genetic testing:  No pathogenic variants detected on the [facility] Breast Cancer STAT Panel or Common Hereditary Cancers Panel. The report date is 03/25/2020.  The Breast Cancer STAT Panel offered by [facility] includes sequencing and deletion/duplication analysis for the following 9 genes:  ATM, BRCA1, BRCA2, CDH1, CHEK2, PALB2, PTEN, STK11 and TP53.  The Common Hereditary Cancers Panel offered by [facility] includes sequencing and/or deletion duplication testing of the following 48 genes: APC, ATM, AXIN2, BARD1, BMPR1A, BRCA1, BRCA2, BRIP1, CDH1, CDK4, CDKN2A (p14ARF), CDKN2A (p16INK4a), CHEK2, CTNNA1, DICER1, EPCAM (Deletion/duplication testing only), GREM1 (promoter region deletion/duplication testing only), KIT, MEN1, MLH1, MSH2, MSH3, MSH6, MUTYH, NBN, NF1, NHTL1, PALB2, PDGFRA, PMS2, POLD1, POLE, PTEN, RAD50, RAD51C, RAD51D, RNF43, SDHB, SDHC, SDHD, SMAD4, SMARCA4. STK11, TP53, TSC1, TSC2, and VHL.  The following genes were  evaluated for sequence changes only: SDHA and HOXB13 c.251G>A variant only.   04/04/2020 Cancer Staging   Staging form: Breast, AJCC 8th Edition - Pathologic stage from 04/04/2020: Stage IA (pT1c, pN0, cM0, G1, ER+, PR+, HER2-)   04/04/2020 Surgery   Right lumpectomy Ninfa Linden) [phone]): IDC, grade 1, with intermediate grade DCIS. Negative margins. 4 axillary lymph nodes were negative.   05/02/2020 - 06/15/2020 Radiation Therapy   The patient initially received a dose of 50.4 Gy in 28 fractions to the breast using whole-breast tangent fields. This was delivered using a 3-D conformal technique. The patient then received a boost to the seroma. This delivered an additional 10 Gy in 5 fractions using a electron technique. The total dose was 60.4 Gy.   07/2020 -  Anti-estrogen oral therapy   Tamoxifen     INTERVAL HISTORY:  [name] to review her survivorship care plan detailing her treatment course for breast cancer, as well as monitoring long-term side effects of that treatment, education regarding health maintenance, screening, and overall wellness and health promotion.     Overall, [name] reports feeling quite well.  She was having difficulty with hot flashes and night sweats.  I suggested she change the time of day that she takes tamoxifen, which she did and they have since improved.  She has also had two migraines since we last talked.    She is unsure if there is a correlation with her treatment and the migraines. They have been significant and at times debilitating.   The first migraine she experienced correlated with her cycle.  She had one last week and cannot identify any triggers.  She noticed her second headache when she woke up in the middle of the night to use the restroom.  She drank water and took tylenol.  She noted some chills, and low grade fever with this.  She notes it lasted for hours and didn't resolve until 7pm.    REVIEW OF SYSTEMS:  Review of Systems   Constitutional: Negative for appetite change, chills, diaphoresis, fatigue, fever and unexpected weight change.  HENT:   Negative for lump/mass.   Eyes: Negative for eye problems and icterus.  Respiratory: Negative for chest tightness, cough and shortness of breath.   Cardiovascular: Negative for chest pain, leg swelling and palpitations.  Gastrointestinal: Negative for abdominal distention, abdominal pain, constipation, diarrhea, nausea and vomiting.  Endocrine: Positive for hot flashes (improved).  Genitourinary: Negative for difficulty urinating.   Musculoskeletal: Negative for gait problem.  Skin: Negative for itching and rash.  Neurological: Positive for headaches. Negative for dizziness, extremity weakness, gait problem, numbness, seizures and speech difficulty.  Hematological: Negative for adenopathy. Does not bruise/bleed easily.  Psychiatric/Behavioral: Negative for depression. The patient is not nervous/anxious.    Breast: Denies any new nodularity, masses, tenderness, nipple changes, or nipple discharge.      ONCOLOGY TREATMENT TEAM:  1. Surgeon:  Dr. Ninfa Linden at [facility] 2. Medical Oncologist: Dr. Jana Hakim  3. Radiation Oncologist: Dr. Lisbeth Renshaw    PAST MEDICAL/SURGICAL HISTORY:  Past Medical History:  Diagnosis Date  . Bipolar 1 disorder ([place])   . Complication of anesthesia   . Diabetes mellitus type 2 in obese ([place])   . Family history of prostate cancer   . Obesity   . PONV (postoperative nausea and vomiting)   . Seasonal allergies    Past Surgical History:  Procedure Laterality Date  . BREAST LUMPECTOMY WITH RADIOACTIVE SEED AND SENTINEL LYMPH NODE BIOPSY Right 04/04/2020   Procedure: RIGHT BREAST LUMPECTOMY WITH RADIOACTIVE SEED AND SENTINEL LYMPH NODE BIOPSY;  Surgeon: [name], MD;  Location: [place];  Service: General;  Laterality: Right;  . OOPHORECTOMY  2009     ALLERGIES:  No Known Allergies   CURRENT  MEDICATIONS:  Outpatient Encounter Medications as of 01/10/2021  Medication Sig  . ALPRAZolam (XANAX) 0.25 MG tablet Take 1 tablet (0.25 mg) two times daily as needed for anxiety  . benztropine (COGENTIN) 1 MG tablet Take 1 tablet (1 mg total) by mouth 2 (two) times daily. For prevention of drug induced involuntary movement (Patient not taking: Reported on 08/31/2020)  . divalproex (DEPAKOTE ER) 500 MG 24 hr tablet Take 1 tablet (500 mg total) by mouth 2 (two) times daily. For mood stabilization  . metFORMIN (GLUCOPHAGE) 500 MG tablet Take 1 tablet (500 mg total) by mouth 2 (two) times daily with a meal. For diabetes management  . OLANZapine zydis (ZYPREXA) 5 MG disintegrating tablet Take 0.5 tablets (2.5 mg total) by mouth daily. For mood control  . ondansetron (ZOFRAN) 4 MG tablet Take 1 tablet (4 mg total) by mouth every 8 (eight) hours as needed for nausea or vomiting. (Patient not taking: Reported on 08/31/2020)  . tamoxifen (NOLVADEX) 20 MG tablet Take 1 tablet (20 mg total) by mouth daily.  . trihexyphenidyl (ARTANE) 5 MG tablet    No facility-administered encounter medications on file as of 01/10/2021.     ONCOLOGIC FAMILY HISTORY:  Family History  Problem Relation Age of Onset  . Heart disease Other   . Macular degeneration Other   . Mental illness Other        both sides of family  . Prostate cancer Paternal Uncle 71  . Immunodeficiency Maternal Aunt   .  Dementia Maternal Grandmother   . Diabetes Maternal Aunt      GENETIC COUNSELING/TESTING: See above  SOCIAL HISTORY:  Social History   Socioeconomic History  . Marital status: Married    Spouse name: Not on file  . Number of children: Not on file  . Years of education: Not on file  . Highest education level: Not on file  Occupational History  . Not on file  Tobacco Use  . Smoking status: Former Research scientist (life sciences)  . Smokeless tobacco: Never Used  Substance and Sexual Activity  . Alcohol use: Yes    Comment: social  . Drug  use: No  . Sexual activity: Yes    Birth control/protection: Pill  Other Topics Concern  . Not on file  Social History Narrative   Works   Investment banker, operational of Radio broadcast assistant Strain: Not on [employer] Insecurity: Not on file  Transportation Needs: Not on file  Physical Activity: Not on file  Stress: Not on file  Social Connections: Not on file  Intimate Partner Violence: Not on file     OBSERVATIONS/OBJECTIVE:   LABORATORY DATA:  None for this visit.  DIAGNOSTIC IMAGING:  None for this visit.      ASSESSMENT AND PLAN:  Ms.. Mcneil is a pleasant 45 y.o. female with Stage IA right breast invasive ductal carcinoma, ER+/PR+/HER2-, diagnosed in 02/2020, treated with lumpectomy, adjuvant radiation therapy, and anti-estrogen therapy with Tamoxifen beginning in 07/2020.  She presents to the Survivorship Clinic for our initial meeting and routine follow-up post-completion of treatment for breast cancer.    1. Stage IA right breast cancer:  [name] is continuing to recover from definitive treatment for breast cancer. She will follow-up with her medical oncologist, Dr. Jana Hakim in April, 2022 with history and physical exam per surveillance protocol.  She will continue her anti-estrogen therapy with Tamoxifen (see #2). Her mammogram is due 02/2021. Today, a comprehensive survivorship care plan and treatment summary was reviewed with the patient today detailing her breast cancer diagnosis, treatment course, potential late/long-term effects of treatment, appropriate follow-up care with recommendations for the future, and patient education resources.  A copy of this summary, along with a letter will be sent to the patient's primary care provider via mail/fax/In Basket message after today's visit.    2. Migraines: We will take a step wise approach to [place]' migraines.  I recommended she optimize water, sleep, diet, and exercise.  I placed orders for a brain MRI.  I also placed a  referral to Dr. Mickeal Skinner for evaluation and discussion on this being ? Treatment related or if she needs to go on prevention, versus taking a break from antiestrogen therapy and then transitioning to Zoladex and Anastrozole.    3. Bone health:   She was given education on specific activities to promote bone health.  4. Cancer screening:  Due to [name]'s history and her age, she should receive screening for skin cancers, colon cancer, and gynecologic cancers.  The information and recommendations are listed on the patient's comprehensive care plan/treatment summary and were reviewed in detail with the patient.    5. Health maintenance and wellness promotion: [name] was encouraged to consume 5-7 servings of fruits and vegetables per day. We reviewed the "Nutrition Rainbow" handout, as well as the handout "Take Control of Your Health and Reduce Your Cancer Risk" from the [place].  She was also encouraged to engage in moderate to vigorous exercise for 30 minutes per  day most days of the week. We discussed the [employer] fitness program, which is designed for cancer survivors to help them become more physically fit after cancer treatments.  She was instructed to limit her alcohol consumption and continue to abstain from tobacco use.     6. Support services/counseling: It is not uncommon for this period of the patient's cancer care trajectory to be one of many emotions and stressors.  We discussed how this can be increasingly difficult during the times of quarantine and social distancing due to the COVID-19 pandemic.   She was given information regarding our available services and encouraged to contact me with any questions or for help enrolling in any of our support group/programs.    Follow up instructions:    -Return to cancer center in 03/2021 for f/u with Dr. Jana Hakim  -Mammogram due in 02/2021 -Evaluation by Dr. Mickeal Skinner -Brain MRI -She is welcome to return back to the  Survivorship Clinic at any time; no additional follow-up needed at this time.  -Consider referral back to survivorship as a long-term survivor for continued surveillance  The patient was provided an opportunity to ask questions and all were answered. The patient agreed with the plan and demonstrated an understanding of the instructions.   The patient was advised to call back or seek an in-person evaluation if the symptoms worsen or if the condition fails to improve as anticipated.   I provided 30 minutes of face-to-face video visit time during this encounter, and > 50% was spent counseling as documented under my assessment & plan.  Scot Dock, NP

## 2021-01-14 ENCOUNTER — Telehealth: Payer: Self-pay | Admitting: Internal Medicine

## 2021-01-14 ENCOUNTER — Telehealth: Payer: Self-pay | Admitting: Adult Health

## 2021-01-14 NOTE — Telephone Encounter (Signed)
Scheduled per 1/27 los. Called pt and left a msg  ° °

## 2021-01-14 NOTE — Telephone Encounter (Signed)
Received a new pt referral from [facility] for [name] to see Dr. Mickeal Skinner for worsening headaches. [name] returned my call and has been scheduled to see Dr. Mickeal Skinner on 2/4 at 930am. Pt aware to arrive 15 minutes early.

## 2021-01-18 ENCOUNTER — Inpatient Hospital Stay: Payer: Managed Care, Other (non HMO) | Attending: Internal Medicine | Admitting: Internal Medicine

## 2021-01-18 ENCOUNTER — Other Ambulatory Visit: Payer: Self-pay

## 2021-01-18 DIAGNOSIS — Z79810 Long term (current) use of selective estrogen receptor modulators (SERMs): Secondary | ICD-10-CM | POA: Insufficient documentation

## 2021-01-18 DIAGNOSIS — G43009 Migraine without aura, not intractable, without status migrainosus: Secondary | ICD-10-CM | POA: Diagnosis not present

## 2021-01-18 DIAGNOSIS — Z170 Estrogen receptor positive status [ER+]: Secondary | ICD-10-CM | POA: Insufficient documentation

## 2021-01-18 DIAGNOSIS — C50211 Malignant neoplasm of upper-inner quadrant of right female breast: Secondary | ICD-10-CM | POA: Insufficient documentation

## 2021-01-18 DIAGNOSIS — Z79899 Other long term (current) drug therapy: Secondary | ICD-10-CM | POA: Insufficient documentation

## 2021-01-18 MED ORDER — RIZATRIPTAN BENZOATE 5 MG PO TABS: 5.0000 mg | ORAL_TABLET | ORAL | 0 refills | Status: DC | PRN

## 2021-01-18 NOTE — Progress Notes (Signed)
[facility] at [place], [place] [address] [phone]   New Patient Evaluation  Date of Service: 01/18/21 Patient Name: [name] Patient MRN: [id number] Patient DOB: 21-Dec-1975 Provider: Ventura Sellers, MD  Identifying Statement:  [name] is a 45 y.o. female with new onset headaches who presents for initial consultation and evaluation regarding cancer associated neurologic deficits.    Referring Provider: Precious Gilding, [address] [place],  [place] [address]  Primary Cancer:  Oncologic History: Oncology History  Malignant neoplasm of upper-inner quadrant of right breast in female, estrogen receptor positive ([place])  03/01/2020 Oncotype testing   MammaPrint low risk.   03/05/2020 Initial Diagnosis   Malignant neoplasm of upper-inner quadrant of right breast in female, estrogen receptor positive ([place])   03/25/2020 Genetic Testing   Negative genetic testing:  No pathogenic variants detected on the [facility] Breast Cancer STAT Panel or Common Hereditary Cancers Panel. The report date is 03/25/2020.  The Breast Cancer STAT Panel offered by [facility] includes sequencing and deletion/duplication analysis for the following 9 genes:  ATM, BRCA1, BRCA2, CDH1, CHEK2, PALB2, PTEN, STK11 and TP53.  The Common Hereditary Cancers Panel offered by [facility] includes sequencing and/or deletion duplication testing of the following 48 genes: APC, ATM, AXIN2, BARD1, BMPR1A, BRCA1, BRCA2, BRIP1, CDH1, CDK4, CDKN2A (p14ARF), CDKN2A (p16INK4a), CHEK2, CTNNA1, DICER1, EPCAM (Deletion/duplication testing only), GREM1 (promoter region deletion/duplication testing only), KIT, MEN1, MLH1, MSH2, MSH3, MSH6, MUTYH, NBN, NF1, NHTL1, PALB2, PDGFRA, PMS2, POLD1, POLE, PTEN, RAD50, RAD51C, RAD51D, RNF43, SDHB, SDHC, SDHD, SMAD4, SMARCA4. STK11, TP53, TSC1, TSC2, and VHL.  The following genes were evaluated for sequence changes only: SDHA and  HOXB13 c.251G>A variant only.   04/04/2020 Cancer Staging   Staging form: Breast, AJCC 8th Edition - Pathologic stage from 04/04/2020: Stage IA (pT1c, pN0, cM0, G1, ER+, PR+, HER2-)   04/04/2020 Surgery   Right lumpectomy Ninfa Linden) [phone]): IDC, grade 1, with intermediate grade DCIS. Negative margins. 4 axillary lymph nodes were negative.   05/02/2020 - 06/15/2020 Radiation Therapy   The patient initially received a dose of 50.4 Gy in 28 fractions to the breast using whole-breast tangent fields. This was delivered using a 3-D conformal technique. The patient then received a boost to the seroma. This delivered an additional 10 Gy in 5 fractions using a electron technique. The total dose was 60.4 Gy.   07/2020 -  Anti-estrogen oral therapy   Tamoxifen     History of Present Illness: The patient's records from the referring physician were obtained and reviewed and the patient interviewed to confirm this HPI.  [name] presents today to discuss new headache syndrome.  She describes 5 episodes of very severe headache; unilateral throbbing radiating to neck and face (left), associated with nausea and vomiting (once), some photophobia.  Headaches last for several hours to an entire day, and she has missed work because of the pain and dysfunction.  Currently doses aromatase inhibitor with Dr. Jana Hakim for breast cancer.  Extensive family history of migraine, but no personal history up to this point.  No other neurologic complaints.  Medications: Current Outpatient Medications on File Prior to Visit  Medication Sig Dispense Refill  . divalproex (DEPAKOTE ER) 500 MG 24 hr tablet Take 1 tablet (500 mg total) by mouth 2 (two) times daily. For mood stabilization 60 tablet 0  . metFORMIN (GLUCOPHAGE) 500 MG tablet Take 1 tablet (500 mg total) by mouth 2 (two) times daily  with a meal. For diabetes management 180 tablet 4  . OLANZapine zydis (ZYPREXA) 5 MG disintegrating tablet Take 0.5 tablets  (2.5 mg total) by mouth daily. For mood control    . tamoxifen (NOLVADEX) 20 MG tablet Take 1 tablet (20 mg total) by mouth daily. 90 tablet 4  . trihexyphenidyl (ARTANE) 5 MG tablet     . ALPRAZolam (XANAX) 0.25 MG tablet Take 1 tablet (0.25 mg) two times daily as needed for anxiety (Patient not taking: Reported on 01/18/2021) 30 tablet 0   No current facility-administered medications on file prior to visit.    Allergies: No Known Allergies Past Medical History:  Past Medical History:  Diagnosis Date  . Bipolar 1 disorder ([place])   . Complication of anesthesia   . Diabetes mellitus type 2 in obese ([place])   . Family history of prostate cancer   . Obesity   . PONV (postoperative nausea and vomiting)   . Seasonal allergies    Past Surgical History:  Past Surgical History:  Procedure Laterality Date  . BREAST LUMPECTOMY WITH RADIOACTIVE SEED AND SENTINEL LYMPH NODE BIOPSY Right 04/04/2020   Procedure: RIGHT BREAST LUMPECTOMY WITH RADIOACTIVE SEED AND SENTINEL LYMPH NODE BIOPSY;  Surgeon: [name], MD;  Location: [facility];  Service: General;  Laterality: Right;  . OOPHORECTOMY  2009   Social History:  Social History   Socioeconomic History  . Marital status: Married    Spouse name: Not on file  . Number of children: Not on file  . Years of education: Not on file  . Highest education level: Not on file  Occupational History  . Not on file  Tobacco Use  . Smoking status: Former Research scientist (life sciences)  . Smokeless tobacco: Never Used  Substance and Sexual Activity  . Alcohol use: Yes    Comment: social  . Drug use: No  . Sexual activity: Yes    Birth control/protection: Pill  Other Topics Concern  . Not on file  Social History Narrative   Works   Investment banker, operational of Radio broadcast assistant Strain: Not on [employer] Insecurity: Not on file  Transportation Needs: Not on file  Physical Activity: Not on file  Stress: Not on file  Social Connections: Not on  file  Intimate Partner Violence: Not on file   Family History:  Family History  Problem Relation Age of Onset  . Heart disease Other   . Macular degeneration Other   . Mental illness Other        both sides of family  . Prostate cancer Paternal Uncle 63  . Immunodeficiency Maternal Aunt   . Dementia Maternal Grandmother   . Diabetes Maternal Aunt     Review of Systems: Constitutional: Doesn't report fevers, chills or abnormal weight loss Eyes: Doesn't report blurriness of vision Ears, nose, mouth, throat, and face: Doesn't report sore throat Respiratory: Doesn't report cough, dyspnea or wheezes Cardiovascular: Doesn't report palpitation, chest discomfort  Gastrointestinal:  Doesn't report nausea, constipation, diarrhea GU: Doesn't report incontinence Skin: Doesn't report skin rashes Neurological: Per HPI Musculoskeletal: Doesn't report joint pain Behavioral/Psych: Doesn't report anxiety  Physical Exam: Vitals:   01/18/21 0931  BP: 132/83  Pulse: 91  Resp: 18  Temp: 98.7 F (37.1 C)  SpO2: 99%   KPS: 90. General: Alert, cooperative, pleasant, in no acute distress Head: Normal EENT: No conjunctival injection or scleral icterus.  Lungs: Resp effort normal Cardiac: Regular rate Abdomen: Non-distended abdomen Skin: No rashes cyanosis or  petechiae. Extremities: No clubbing or edema  Neurologic Exam: Mental Status: Awake, alert, attentive to examiner. Oriented to self and environment. Language is fluent with intact comprehension.  Cranial Nerves: Visual acuity is grossly normal. Visual fields are full. Extra-ocular movements intact. No ptosis. Face is symmetric Motor: Tone and bulk are normal. Power is full in both arms and legs. Reflexes are symmetric, no pathologic reflexes present.  Sensory: Intact to light touch Gait: Normal.   Labs: I have reviewed the data as listed    Component Value Date/Time   NA 137 08/31/2020 1104   K 4.5 08/31/2020 1104   CL 98  08/31/2020 1104   CO2 27 08/31/2020 1104   GLUCOSE 276 (H) 08/31/2020 1104   BUN 7 08/31/2020 1104   CREATININE 0.69 08/31/2020 1104   CREATININE 0.80 03/07/2020 0810   CALCIUM 9.3 08/31/2020 1104   PROT 7.4 08/31/2020 1104   ALBUMIN 3.5 08/31/2020 1104   AST 13 (L) 08/31/2020 1104   AST 14 (L) 03/07/2020 0810   ALT 15 08/31/2020 1104   ALT 12 03/07/2020 0810   ALKPHOS 54 08/31/2020 1104   BILITOT 0.2 (L) 08/31/2020 1104   BILITOT 0.3 03/07/2020 0810   GFRNONAA >60 08/31/2020 1104   GFRNONAA >60 03/07/2020 0810   GFRAA >60 08/31/2020 1104   GFRAA >60 03/07/2020 0810   Lab Results  Component Value Date   WBC 7.2 08/31/2020   NEUTROABS 4.8 08/31/2020   HGB 14.3 08/31/2020   HCT 43.3 08/31/2020   MCV 83.1 08/31/2020   PLT 287 08/31/2020     Assessment/Plan Migraine without aura  [name] presents with clinical syndrome consistent with migraine headache without aura.  Precipitating etiology is likely breast cancer as physical and metabolic stressor, but consideration should also be given to structural issue such as metastasis.  We are supportive of obtaining CNS imaging in this case, to rule out life-threatening metastatic or leptomeningeal process.  For headache symptoms, NSAIDs have been ineffective.  We recommended trial of Maxalt 45m.  She should take the drug at the earliest sign of headache as abortive agent.  For prevention, if needed Depakote could be uptitrated from current dose (used for mood/bipolar).    We spent twenty additional minutes teaching regarding the natural history, biology, and historical experience in the treatment of neurologic complications of cancer.   We appreciate the opportunity to participate in the care of [employer].  We will follow up with her via phone in ~2 months to assess response to maxalt, or sooner if needed.  If brain MRI is abnormal we will intervene appropriately with the support of the brain/spine tumor  board.  All questions were answered. The patient knows to call the clinic with any problems, questions or concerns. No barriers to learning were detected.  The total time spent in the encounter was 40 minutes and more than 50% was on counseling and review of test results   ZVentura Sellers MD Medical Director of Neuro-Oncology [facility]02/04/22 10:03 AM

## 2021-01-22 ENCOUNTER — Telehealth: Payer: Self-pay

## 2021-01-22 NOTE — Telephone Encounter (Signed)
She called and left a message. Requesting anti-anxiety medication to take prior to 2/21 scan. Forwarded a message to [employer]. NP.

## 2021-01-28 ENCOUNTER — Other Ambulatory Visit: Payer: Self-pay | Admitting: Radiation Therapy

## 2021-02-04 ENCOUNTER — Other Ambulatory Visit: Payer: Self-pay | Admitting: Oncology

## 2021-02-04 ENCOUNTER — Ambulatory Visit (HOSPITAL_COMMUNITY): Admission: RE | Admit: 2021-02-04 | Payer: Managed Care, Other (non HMO) | Source: Ambulatory Visit

## 2021-02-04 MED ORDER — DIAZEPAM 5 MG PO TABS: ORAL_TABLET | ORAL | 0 refills | Status: DC

## 2021-02-19 ENCOUNTER — Ambulatory Visit: Payer: 59 | Admitting: Oncology

## 2021-02-19 ENCOUNTER — Other Ambulatory Visit: Payer: 59

## 2021-02-28 ENCOUNTER — Ambulatory Visit
Admission: RE | Admit: 2021-02-28 | Discharge: 2021-02-28 | Disposition: A | Discharge status: Home / Self Care | Payer: Managed Care, Other (non HMO) | Source: Ambulatory Visit | Attending: Adult Health | Admitting: Adult Health

## 2021-02-28 ENCOUNTER — Other Ambulatory Visit: Payer: Self-pay

## 2021-02-28 ENCOUNTER — Ambulatory Visit (HOSPITAL_COMMUNITY)
Admission: RE | Admit: 2021-02-28 | Discharge: 2021-02-28 | Disposition: A | Discharge status: Home / Self Care | Payer: Managed Care, Other (non HMO) | Source: Ambulatory Visit | Attending: Adult Health | Admitting: Adult Health

## 2021-02-28 DIAGNOSIS — C50211 Malignant neoplasm of upper-inner quadrant of right female breast: Secondary | ICD-10-CM | POA: Diagnosis present

## 2021-02-28 DIAGNOSIS — Z170 Estrogen receptor positive status [ER+]: Secondary | ICD-10-CM | POA: Diagnosis present

## 2021-02-28 DIAGNOSIS — R519 Headache, unspecified: Secondary | ICD-10-CM | POA: Diagnosis present

## 2021-02-28 MED ORDER — GADOBUTROL 1 MMOL/ML IV SOLN
10.0000 mL | Freq: Once | INTRAVENOUS | Status: AC | PRN
  Administered 2021-02-28: 10 mL via INTRAVENOUS

## 2021-03-04 ENCOUNTER — Telehealth: Payer: Self-pay

## 2021-03-04 NOTE — Telephone Encounter (Signed)
RN returned call, voicemail left for call back.  

## 2021-03-21 ENCOUNTER — Inpatient Hospital Stay: Payer: Managed Care, Other (non HMO) | Admitting: Internal Medicine

## 2021-03-22 ENCOUNTER — Telehealth: Payer: Self-pay | Admitting: Oncology

## 2021-03-22 NOTE — Telephone Encounter (Signed)
Scheduled per 4/6 sch msg. Called pt and left a msg

## 2021-03-25 ENCOUNTER — Inpatient Hospital Stay: Payer: Managed Care, Other (non HMO) | Admitting: Oncology

## 2021-03-25 ENCOUNTER — Inpatient Hospital Stay: Payer: Managed Care, Other (non HMO)

## 2021-04-19 ENCOUNTER — Telehealth: Payer: Self-pay | Admitting: Oncology

## 2021-04-19 NOTE — Telephone Encounter (Signed)
R/s appts per 5/6 sch msg. Pt aware.  

## 2021-04-24 ENCOUNTER — Other Ambulatory Visit: Payer: Managed Care, Other (non HMO)

## 2021-04-24 ENCOUNTER — Ambulatory Visit: Payer: Managed Care, Other (non HMO) | Admitting: Oncology

## 2021-05-07 ENCOUNTER — Ambulatory Visit: Payer: Managed Care, Other (non HMO) | Admitting: Oncology

## 2021-05-07 ENCOUNTER — Other Ambulatory Visit: Payer: Managed Care, Other (non HMO)

## 2021-05-09 ENCOUNTER — Telehealth: Payer: Self-pay | Admitting: Adult Health

## 2021-05-09 NOTE — Telephone Encounter (Signed)
R/s appts per 5/26 sch msg. Called pt, no answer. Left msg with appt date and time.

## 2021-05-10 ENCOUNTER — Other Ambulatory Visit: Payer: Managed Care, Other (non HMO)

## 2021-05-10 ENCOUNTER — Ambulatory Visit: Payer: Managed Care, Other (non HMO) | Admitting: Adult Health

## 2021-05-13 IMAGING — MG DIGITAL DIAGNOSTIC BILAT W/ TOMO W/ CAD
9 series · 9 of 25 positions shown · non-contrast
Comparison: Prior films

CLINICAL DATA: Personal history of right breast cancer status post
lumpectomy 3937

EXAM:
DIGITAL DIAGNOSTIC BILATERAL MAMMOGRAM WITH TOMOSYNTHESIS AND CAD
TECHNIQUE: Bilateral digital diagnostic mammography and breast tomosynthesis
was performed. The images were evaluated with computer-aided
detection.

[R CC]
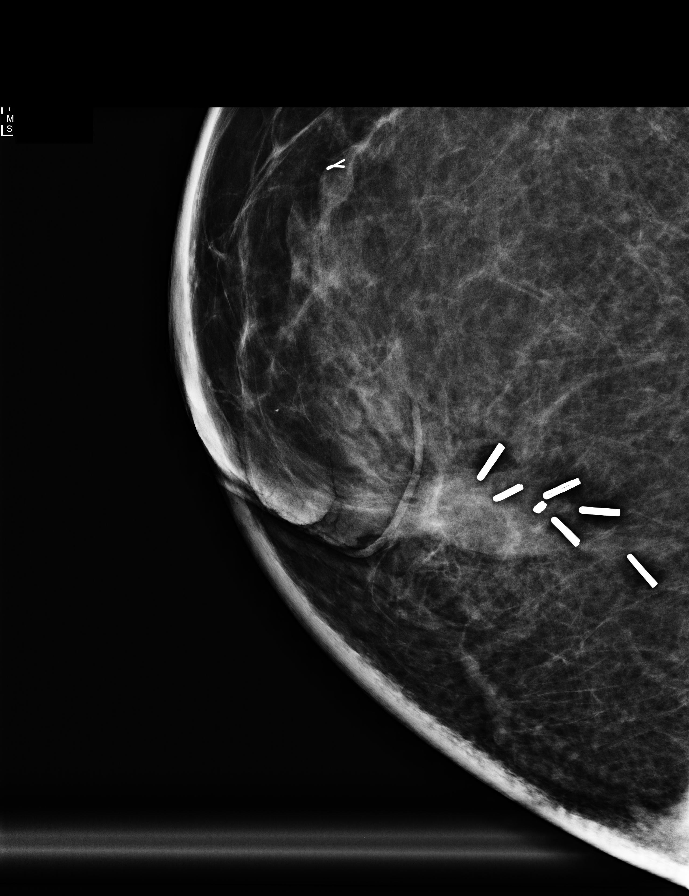

[R CC synth-2D]
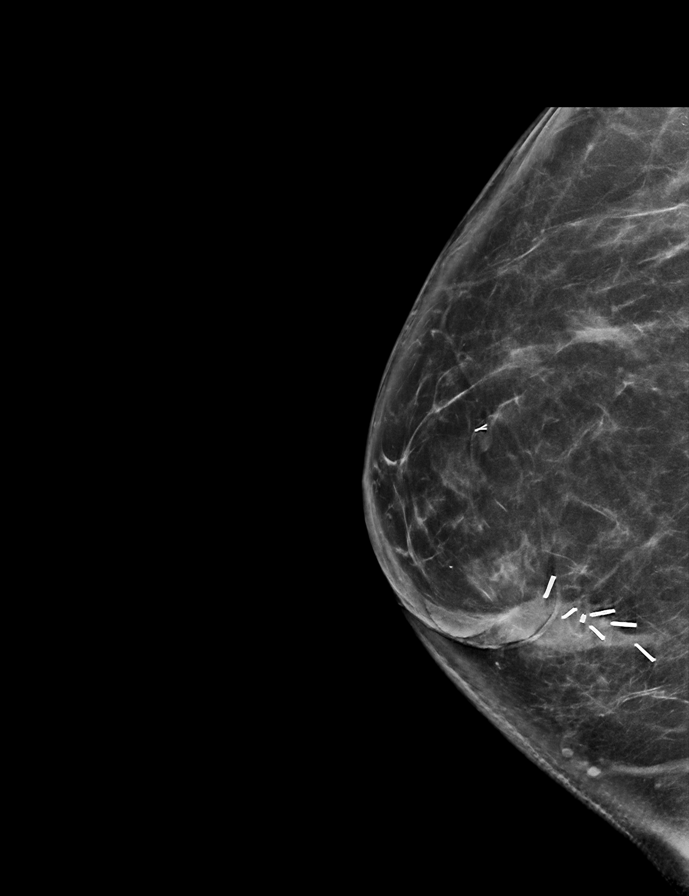

[L CC synth-2D]
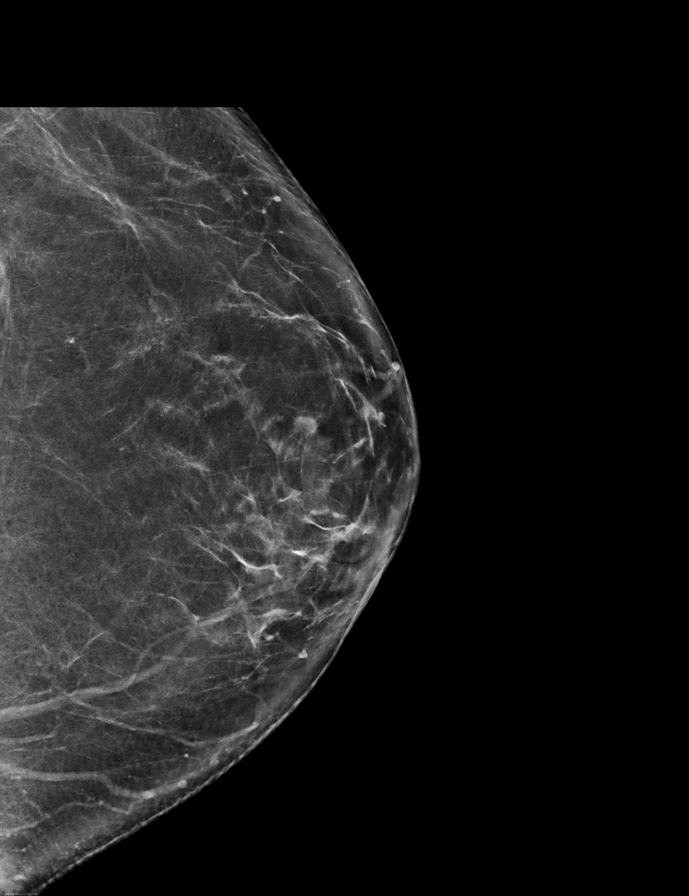

[R MLO synth-2D]
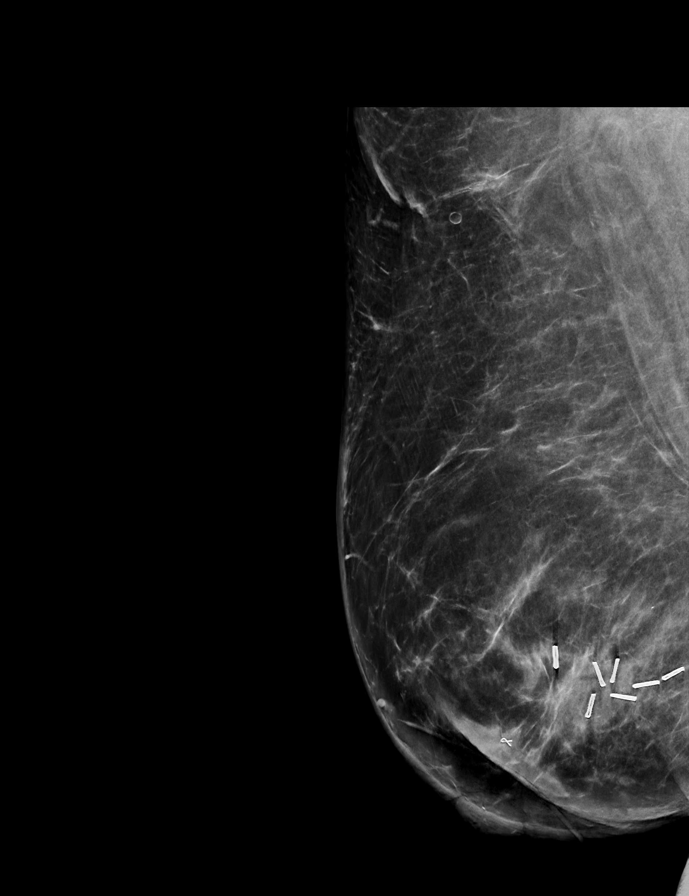

[L MLO synth-2D]
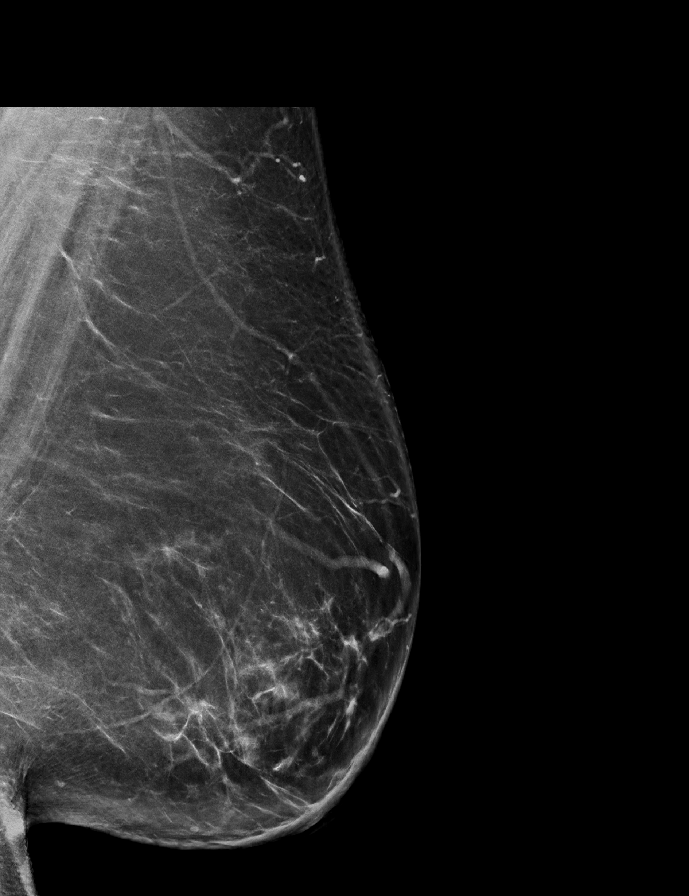

[R CC tomo · tomo slice 45/90.0]
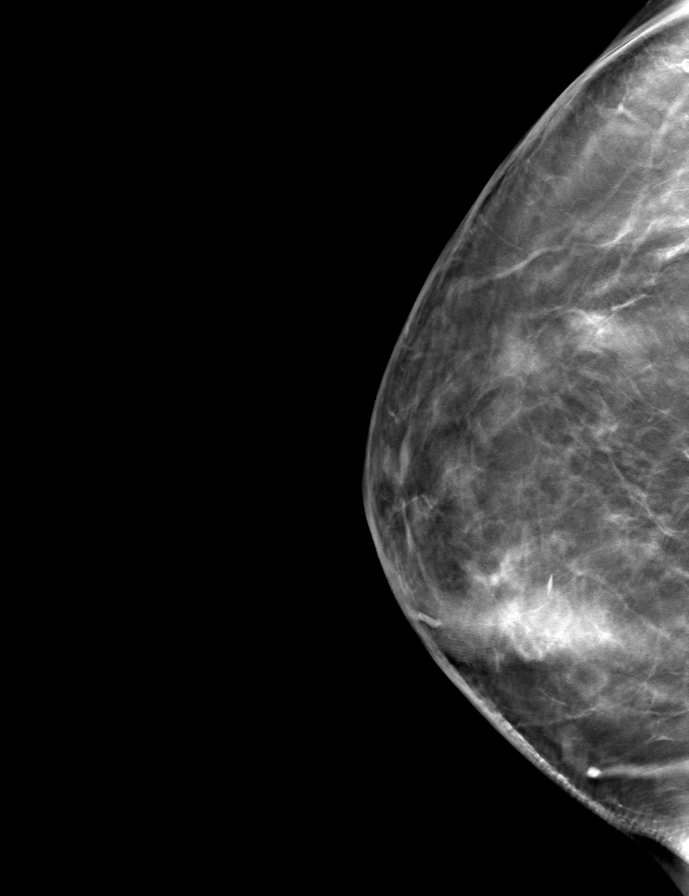

[R MLO tomo · tomo slice 49/98.0]
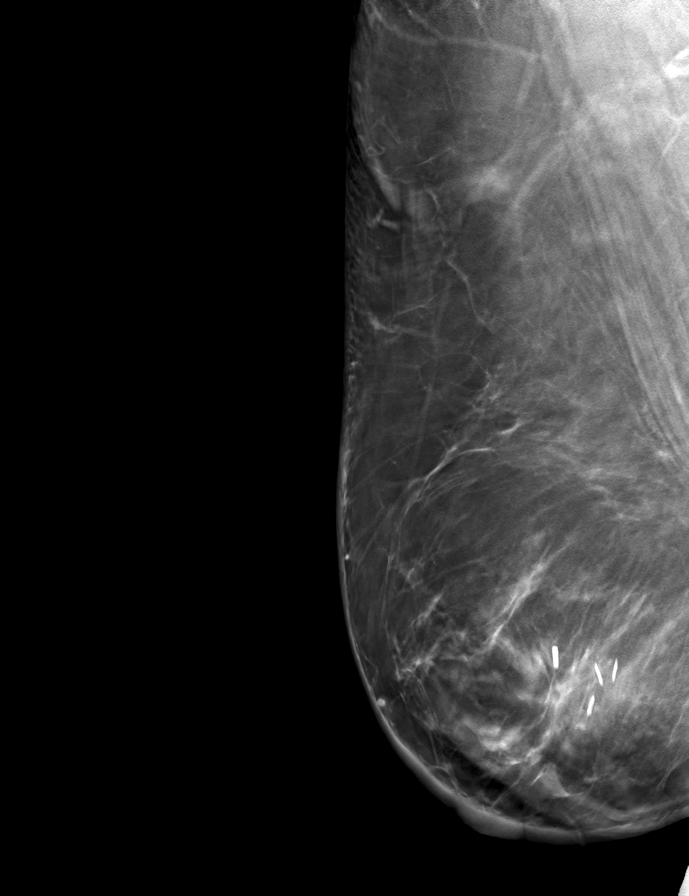

[L CC tomo · tomo slice 41/81.0]
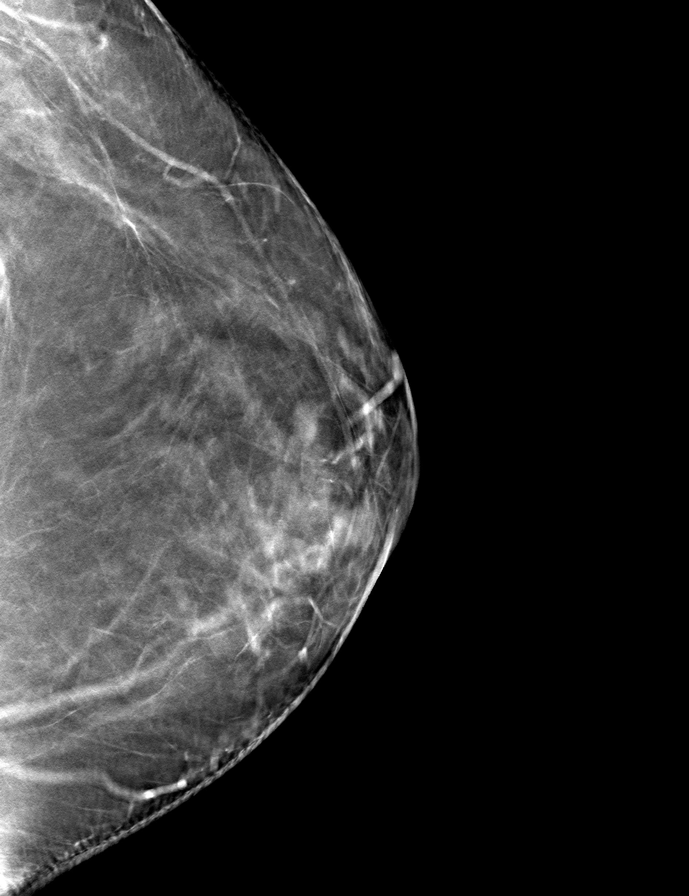

[L MLO tomo · tomo slice 47/92.0]
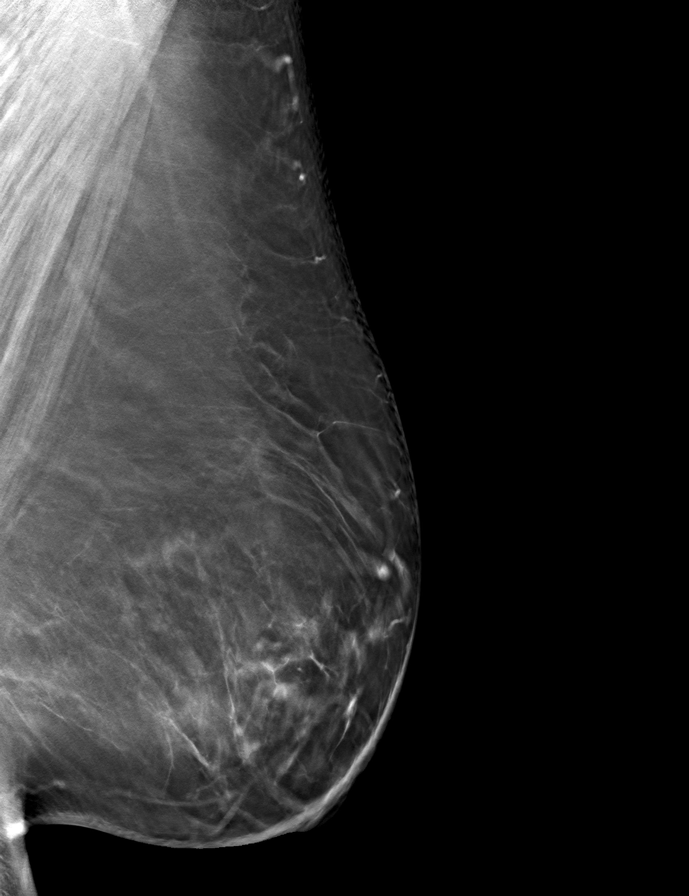

[9 of 25 positions shown; findings below may reference images not displayed]

ACR Breast Density Category b: There are scattered areas of
fibroglandular density.
FINDINGS: Cc and MLO views of bilateral breasts, spot tangential view of right
breast are submitted. Postsurgical changes are identified in the
right breast. No suspicious abnormality is identified bilaterally.
IMPRESSION: Benign findings.

RECOMMENDATION:
Bilateral diagnostic mammogram in 1 year.

I have discussed the findings and recommendations with the patient.
If applicable, a reminder letter will be sent to the patient
regarding the next appointment.

BI-RADS CATEGORY  2: Benign.

## 2021-05-31 ENCOUNTER — Other Ambulatory Visit: Payer: Self-pay

## 2021-05-31 ENCOUNTER — Encounter: Payer: Self-pay | Admitting: Adult Health

## 2021-05-31 ENCOUNTER — Inpatient Hospital Stay: Payer: Managed Care, Other (non HMO) | Admitting: Adult Health

## 2021-05-31 ENCOUNTER — Inpatient Hospital Stay: Payer: Managed Care, Other (non HMO) | Attending: Adult Health

## 2021-05-31 VITALS — BP 132/86 | HR 80 | Temp 97.6°F | Resp 18 | Ht 65.0 in | Wt 230.8 lb

## 2021-05-31 DIAGNOSIS — D241 Benign neoplasm of right breast: Secondary | ICD-10-CM

## 2021-05-31 DIAGNOSIS — Z7984 Long term (current) use of oral hypoglycemic drugs: Secondary | ICD-10-CM | POA: Insufficient documentation

## 2021-05-31 DIAGNOSIS — Z79899 Other long term (current) drug therapy: Secondary | ICD-10-CM | POA: Diagnosis not present

## 2021-05-31 DIAGNOSIS — E119 Type 2 diabetes mellitus without complications: Secondary | ICD-10-CM | POA: Insufficient documentation

## 2021-05-31 DIAGNOSIS — C50211 Malignant neoplasm of upper-inner quadrant of right female breast: Secondary | ICD-10-CM

## 2021-05-31 DIAGNOSIS — Z6838 Body mass index (BMI) 38.0-38.9, adult: Secondary | ICD-10-CM | POA: Diagnosis not present

## 2021-05-31 DIAGNOSIS — R519 Headache, unspecified: Secondary | ICD-10-CM | POA: Diagnosis not present

## 2021-05-31 DIAGNOSIS — Z923 Personal history of irradiation: Secondary | ICD-10-CM | POA: Diagnosis not present

## 2021-05-31 DIAGNOSIS — Z79810 Long term (current) use of selective estrogen receptor modulators (SERMs): Secondary | ICD-10-CM | POA: Insufficient documentation

## 2021-05-31 DIAGNOSIS — F319 Bipolar disorder, unspecified: Secondary | ICD-10-CM | POA: Diagnosis not present

## 2021-05-31 DIAGNOSIS — E1169 Type 2 diabetes mellitus with other specified complication: Secondary | ICD-10-CM

## 2021-05-31 DIAGNOSIS — E669 Obesity, unspecified: Secondary | ICD-10-CM | POA: Diagnosis not present

## 2021-05-31 DIAGNOSIS — Z87891 Personal history of nicotine dependence: Secondary | ICD-10-CM | POA: Insufficient documentation

## 2021-05-31 DIAGNOSIS — Z170 Estrogen receptor positive status [ER+]: Secondary | ICD-10-CM | POA: Diagnosis not present

## 2021-05-31 DIAGNOSIS — E11649 Type 2 diabetes mellitus with hypoglycemia without coma: Secondary | ICD-10-CM

## 2021-05-31 DIAGNOSIS — Z6841 Body Mass Index (BMI) 40.0 and over, adult: Secondary | ICD-10-CM

## 2021-05-31 LAB — CBC WITH DIFFERENTIAL/PLATELET
Abs Immature Granulocytes: 0.02 10*3/uL (ref 0.00–0.07)
Basophils Absolute: 0 10*3/uL (ref 0.0–0.1)
Basophils Relative: 0 %
Eosinophils Absolute: 0.1 10*3/uL (ref 0.0–0.5)
Eosinophils Relative: 2 %
HCT: 39.4 % (ref 36.0–46.0)
Hemoglobin: 13.4 g/dL (ref 12.0–15.0)
Immature Granulocytes: 0 %
Lymphocytes Relative: 35 %
Lymphs Abs: 2 10*3/uL (ref 0.7–4.0)
MCH: 28.4 pg (ref 26.0–34.0)
MCHC: 34 g/dL (ref 30.0–36.0)
MCV: 83.5 fL (ref 80.0–100.0)
Monocytes Absolute: 0.5 10*3/uL (ref 0.1–1.0)
Monocytes Relative: 9 %
Neutro Abs: 3.2 10*3/uL (ref 1.7–7.7)
Neutrophils Relative %: 54 %
Platelets: 281 10*3/uL (ref 150–400)
RBC: 4.72 MIL/uL (ref 3.87–5.11)
RDW: 12.4 % (ref 11.5–15.5)
WBC: 5.8 10*3/uL (ref 4.0–10.5)
nRBC: 0 % (ref 0.0–0.2)

## 2021-05-31 LAB — CMP (CANCER CENTER ONLY)
ALT: 19 U/L (ref 0–44)
AST: 20 U/L (ref 15–41)
Albumin: 3.5 g/dL (ref 3.5–5.0)
Alkaline Phosphatase: 50 U/L (ref 38–126)
Anion gap: 11 (ref 5–15)
BUN: 9 mg/dL (ref 6–20)
CO2: 23 mmol/L (ref 22–32)
Calcium: 8.9 mg/dL (ref 8.9–10.3)
Chloride: 102 mmol/L (ref 98–111)
Creatinine: 0.36 mg/dL — ABNORMAL LOW (ref 0.44–1.00)
GFR, Estimated: >60 mL/min (ref >60)
Glucose, Bld: 269 mg/dL — ABNORMAL HIGH (ref 70–99)
Potassium: 4 mmol/L (ref 3.5–5.1)
Sodium: 136 mmol/L (ref 135–145)
Total Bilirubin: 0.3 mg/dL (ref 0.3–1.2)
Total Protein: 6.8 g/dL (ref 6.5–8.1)

## 2021-05-31 MED ORDER — TAMOXIFEN CITRATE 20 MG PO TABS: 20.0000 mg | ORAL_TABLET | Freq: Every day | ORAL | 4 refills | Status: DC

## 2021-05-31 MED ORDER — RIZATRIPTAN BENZOATE 5 MG PO TABS: 5.0000 mg | ORAL_TABLET | ORAL | 0 refills | Status: AC | PRN

## 2021-05-31 MED ORDER — METFORMIN HCL 500 MG PO TABS: 500.0000 mg | ORAL_TABLET | Freq: Two times a day (BID) | ORAL | 4 refills | Status: DC

## 2021-05-31 NOTE — Progress Notes (Signed)
[place]  [phone] [phone]     ID: [name] DOB: 1976/02/15  MR#: [id number]  HWE#:[id number]  Patient Care Team: Precious Gilding, [place] as PCP - General (Physician Assistant) System, Provider Not In Thurnell Lose, MD as Consulting Physician (Obstetrics and Gynecology) [name], MD as Consulting Physician (General Surgery) Magrinat, Virgie Dad, MD as Consulting Physician (Oncology) Kyung Rudd, MD as Consulting Physician (Radiation Oncology) Scot Dock, NP OTHER MD: Pauline Good, NP (psychiatry)   CHIEF COMPLAINT: estrogen receptor positive breast cancer  CURRENT TREATMENT: Adjuvant radiation to be followed by tamoxifen   INTERVAL HISTORY: [name] returns today for follow up of her estrogen receptor positive breast cancer.  She is taking Tamoxifen daily.  She tolerating this well.    She underwent bilateral breast mammogram on 02/28/2021 that showed no evidence of malignancy and breast density category B.  On that same day she also underwent brain MRI for worsening headaches she had been experiencing and that was negative for any abnormality, including brain metastases.    [name] recently changed jobs and notes she has improved job enjoyment and is getting more physical activity in.  She also had f/u eye exam and needed new glasses.   She met with Dr. Mickeal Skinner about her headaches and he prescribed her an improved rescue medication for her migraines.  She has had to take this medication once this past month and notes it is helpful.    [name] plans on scheduling with GYN when she returns from her beach trip she is going on next week.  She does not yet have a PCP.   REVIEW OF SYSTEMS: Review of Systems  Constitutional:  Negative for appetite change, chills, fatigue, fever and unexpected weight change.  HENT:   Negative for hearing loss, lump/mass and trouble swallowing.   Eyes:  Negative for eye problems and icterus.   Respiratory:  Negative for chest tightness, cough and shortness of breath.   Cardiovascular:  Negative for chest pain, leg swelling and palpitations.  Gastrointestinal:  Negative for abdominal distention, abdominal pain, constipation, diarrhea, nausea and vomiting.  Endocrine: Negative for hot flashes.  Genitourinary:  Negative for difficulty urinating.   Musculoskeletal:  Negative for arthralgias.  Skin:  Negative for itching and rash.  Neurological:  Negative for dizziness, extremity weakness, headaches and numbness.  Hematological:  Negative for adenopathy. Does not bruise/bleed easily.  Psychiatric/Behavioral:  Negative for depression. The patient is not nervous/anxious.       HISTORY OF CURRENT ILLNESS: From the original intake note:  "[name]" had routine screening mammography on 02/07/2019 showing a possible abnormality in the right breast. She underwent right diagnostic mammography with tomography and right breast ultrasonography at [facility] on 02/23/2019 showing: breast density category B; probably-benign 0.7 cm mass in right breast at 9 o'clock, likely a fibroadenoma. She returned for short-term right breast ultrasound on 08/29/2019, which showed stability of the probably-benign mass.  She returned for her annual mammography and underwent bilateral diagnostic mammography with tomography and right breast ultrasonography at [facility] on 02/27/2020 showing: breast density category B; new area of distortion in medial inferior right breast; this could not be demonstrated on ultrasound.  There was a stable probably-benign right breast mass at 9 o'clock; no evidence of left breast malignancy or lymphadenopathy.  Accordingly on 03/01/2020 she proceeded to biopsy of the right breast area in question. The pathology from this procedure ([id number]) showed: invasive ductal carcinoma, grade 1; adenosis with calcifications. Prognostic  indicators significant for: estrogen receptor, 95%  positive with moderate staining intensity and progesterone receptor, 95% positive with strong staining intensity. Proliferation marker Ki67 at 2%. HER2 negative by immunohistochemistry (1+).  Biopsy of the probably-benign mass was performed on 03/05/2020. 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  The patient's subsequent history is as detailed below.   PAST MEDICAL HISTORY: Past Medical History:  Diagnosis Date   Bipolar 1 disorder ([place])    Complication of anesthesia    Diabetes mellitus type 2 in obese (HCC)    Family history of prostate cancer    Obesity    PONV (postoperative nausea and vomiting)    Seasonal allergies     PAST SURGICAL HISTORY: Past Surgical History:  Procedure Laterality Date   BREAST LUMPECTOMY WITH RADIOACTIVE SEED AND SENTINEL LYMPH NODE BIOPSY Right 04/04/2020   Procedure: RIGHT BREAST LUMPECTOMY WITH RADIOACTIVE SEED AND SENTINEL LYMPH NODE BIOPSY;  Surgeon: [name], MD;  Location: [facility];  Service: General;  Laterality: Right;   OOPHORECTOMY  2009    FAMILY HISTORY: Family History  Problem Relation Age of Onset   Heart disease Other    Macular degeneration Other    Mental illness Other        both sides of family   Prostate cancer Paternal Uncle 79   Immunodeficiency Maternal Aunt    Dementia Maternal Grandmother    Diabetes Maternal Aunt   Her father is age 42 and her mother 58 as of 02/2020.  The patient has 3 brothers and 1 sister. She reports prostate cancer in a paternal uncle at age 36.  There is a family history of ovarian cancer.  She denies a family history of breast or pancreatic cancer.   GYNECOLOGIC HISTORY:  No LMP recorded. (Menstrual status: Other). Menarche: 45 years old [place] P 0 LMP 02/2020, regular and lasts 5-7 days Contraceptive: used since 2009, will switch to IUD with cancer diagnosis HRT n/a  Hysterectomy? no BSO? 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 Husband [name] "Ulice Dash" is a Administrator.  At home is just the 2 of them plus a cat.  They recently moved to a townhouse with a lot of stairs and that is helping her exercise.  She is not a Designer, fashion/clothing.    ADVANCED DIRECTIVES: In the absence of any documentation to the contrary, the patient's spouse is their HCPOA.    HEALTH MAINTENANCE: Social History   Tobacco Use   Smoking status: Former    Pack years: 0.00   Smokeless tobacco: Never  Substance Use Topics   Alcohol use: Yes    Comment: social   Drug use: No     Colonoscopy: n/a (age)  PAP: 12/2018, negative  Bone density: n/a (age)   No Known Allergies  Current Outpatient Medications  Medication Sig Dispense Refill   ALPRAZolam (XANAX) 0.25 MG tablet Take 1 tablet (0.25 mg) two times daily as needed for anxiety (Patient not taking: Reported on 01/18/2021) 30 tablet 0   diazepam (VALIUM) 5 MG tablet Take 20 minutes before MRI; may repeat x 1 as needed for claustrophobia. 2 tablet 0   divalproex (DEPAKOTE ER) 500 MG 24 hr tablet Take 1 tablet (500 mg total) by mouth 2 (two) times daily. For mood stabilization 60 tablet 0   metFORMIN (GLUCOPHAGE) 500 MG tablet Take 1  tablet (500 mg total) by mouth 2 (two) times daily with a meal. For diabetes management 180 tablet 4   OLANZapine zydis (ZYPREXA) 5 MG disintegrating tablet Take 0.5 tablets (2.5 mg total) by mouth daily. For mood control     rizatriptan (MAXALT) 5 MG tablet Take 1 tablet (5 mg total) by mouth as needed for migraine. May repeat in 2 hours if needed 10 tablet 0   tamoxifen (NOLVADEX) 20 MG tablet Take 1 tablet (20 mg total) by mouth daily. 90 tablet 4   trihexyphenidyl (ARTANE) 5 MG tablet      No current facility-administered medications for this visit.    OBJECTIVE:    Vitals:   05/31/21 1012  BP: 132/86  Pulse: 80  Resp: 18   Temp: 97.6 F (36.4 C)  SpO2: 99%     Body mass index is 38.41 kg/m.   Wt Readings from Last 3 Encounters:  05/31/21 230 lb 12.8 oz (104.7 kg)  01/18/21 230 lb 9.6 oz (104.6 kg)  08/31/20 235 lb 1.6 oz (106.6 kg)      ECOG FS:1 - Symptomatic but completely ambulatory GENERAL: Patient is a well appearing female in no acute distress HEENT:  Sclerae anicteric.  Oropharynx clear and moist. No ulcerations or evidence of oropharyngeal candidiasis. Neck is supple.  NODES:  No cervical, supraclavicular, or axillary lymphadenopathy palpated.  BREAST EXAM:  Breast inspected only, right breast s/p lumpectomy, s/p radiation, healing well, slight erythema and radiation changes noted, no sign of local recurrence LUNGS:  Clear to auscultation bilaterally.  No wheezes or rhonchi. HEART:  Regular rate and rhythm. No murmur appreciated. ABDOMEN:  Soft, nontender.  Positive, normoactive bowel sounds. No organomegaly palpated. MSK:  No focal spinal tenderness to palpation. Full range of motion bilaterally in the upper extremities. EXTREMITIES:  No peripheral edema.   SKIN:  Clear with no obvious rashes or skin changes. No nail dyscrasia. NEURO:  Nonfocal. Well oriented.  Appropriate affect.    LAB RESULTS:  CMP     Component Value Date/Time   NA 137 08/31/2020 1104   K 4.5 08/31/2020 1104   CL 98 08/31/2020 1104   CO2 27 08/31/2020 1104   GLUCOSE 276 (H) 08/31/2020 1104   BUN 7 08/31/2020 1104   CREATININE 0.69 08/31/2020 1104   CREATININE 0.80 03/07/2020 0810   CALCIUM 9.3 08/31/2020 1104   PROT 7.4 08/31/2020 1104   ALBUMIN 3.5 08/31/2020 1104   AST 13 (L) 08/31/2020 1104   AST 14 (L) 03/07/2020 0810   ALT 15 08/31/2020 1104   ALT 12 03/07/2020 0810   ALKPHOS 54 08/31/2020 1104   BILITOT 0.2 (L) 08/31/2020 1104   BILITOT 0.3 03/07/2020 0810   GFRNONAA >60 08/31/2020 1104   GFRNONAA >60 03/07/2020 0810   GFRAA >60 08/31/2020 1104   GFRAA >60 03/07/2020 0810    No results found  for: TOTALPROTELP, ALBUMINELP, A1GS, A2GS, BETS, BETA2SER, GAMS, MSPIKE, SPEI  Lab Results  Component Value Date   WBC 5.8 05/31/2021   NEUTROABS 3.2 05/31/2021   HGB 13.4 05/31/2021   HCT 39.4 05/31/2021   MCV 83.5 05/31/2021   PLT 281 05/31/2021    No results found for: LABCA2  No components found for: YKDXIP382  No results for input(s): INR in the last 168 hours.  No results found for: LABCA2  No results found for: NKN397  No results found for: QBH419  No results found for: FXT024  No results found for: CA2729  No components found  for: HGQUANT  No results found for: CEA1 / No results found for: CEA1   No results found for: AFPTUMOR  No results found for: CHROMOGRNA  No results found for: KPAFRELGTCHN, LAMBDASER, KAPLAMBRATIO (kappa/lambda light chains)  No results found for: HGBA, HGBA2QUANT, HGBFQUANT, HGBSQUAN (Hemoglobinopathy evaluation)   No results found for: LDH  No results found for: IRON, TIBC, IRONPCTSAT (Iron and TIBC)  No results found for: FERRITIN  Urinalysis    Component Value Date/Time   COLORURINE YELLOW 08/11/2014 0654   APPEARANCEUR CLOUDY (A) 08/11/2014 0654   LABSPEC 1.004 (L) 08/11/2014 0654   PHURINE 7.0 08/11/2014 0654   GLUCOSEU NEGATIVE 08/11/2014 0654   HGBUR NEGATIVE 08/11/2014 0654   BILIRUBINUR NEGATIVE 08/11/2014 0654   KETONESUR NEGATIVE 08/11/2014 0654   PROTEINUR NEGATIVE 08/11/2014 0654   UROBILINOGEN 0.2 08/11/2014 0654   NITRITE NEGATIVE 08/11/2014 0654   LEUKOCYTESUR NEGATIVE 08/11/2014 0654     STUDIES:  CLINICAL DATA:  Headache, new or worsening. Cancer history. Breast cancer, staging.   EXAM: MRI HEAD WITHOUT AND WITH CONTRAST   TECHNIQUE: Multiplanar, multiecho pulse sequences of the brain and surrounding structures were obtained without and with intravenous contrast.   CONTRAST:  89m GADAVIST GADOBUTROL 1 MMOL/ML IV SOLN   COMPARISON:  None.   FINDINGS: Brain: No acute infarction,  hemorrhage, hydrocephalus, extra-axial collection or mass lesion. The brain parenchyma has normal morphology and signal characteristics. No focus of abnormal contrast enhancement.   Vascular: Normal flow voids.   Skull and upper cervical spine: No focal marrow lesion.   Sinuses/Orbits: Negative.   IMPRESSION: No evidence of intracranial metastatic disease. Unremarkable MRI of the brain.     Electronically Signed   By: KPedro EarlsM.D.   On: 03/01/2021 09:33   CLINICAL DATA:  Personal history of right breast cancer status post lumpectomy 2021   EXAM: DIGITAL DIAGNOSTIC BILATERAL MAMMOGRAM WITH TOMOSYNTHESIS AND CAD   TECHNIQUE: Bilateral digital diagnostic mammography and breast tomosynthesis was performed. The images were evaluated with computer-aided detection.   COMPARISON:  Prior films   ACR Breast Density Category b: There are scattered areas of fibroglandular density.   FINDINGS: Cc and MLO views of bilateral breasts, spot tangential view of right breast are submitted. Postsurgical changes are identified in the right breast. No suspicious abnormality is identified bilaterally.   IMPRESSION: Benign findings.   RECOMMENDATION: Bilateral diagnostic mammogram in 1 year.   I have discussed the findings and recommendations with the patient. If applicable, a reminder letter will be sent to the patient regarding the next appointment.   BI-RADS CATEGORY  2: Benign.     Electronically Signed   By: WAbelardo DieselM.D.   On: 02/28/2021 10:24   ELIGIBLE FOR AVAILABLE RESEARCH PROTOCOL: AET  ASSESSMENT: 45y.o. [place] woman status post right breast upper inner quadrant biopsy 03/01/2020 for a clinical TXN0, stage IA invasive ductal carcinoma, grade 1, estrogen and progesterone receptor positive, HER-2 not amplified, with an MIB-1 of 2%.  (1) genetics testing 03/25/2020 through the   [facility] Breast Cancer STAT Panel or Common Hereditary  Cancers Panel found no deleterious mutations in ATM, BRCA1, BRCA2, CDH1, CHEK2, PALB2, PTEN, STK11 and TP53.  The Common Hereditary Cancers Panel offered by [facility] includes sequencing and/or deletion duplication testing of the following 48 genes: APC, ATM, AXIN2, BARD1, BMPR1A, BRCA1, BRCA2, BRIP1, CDH1, CDK4, CDKN2A (p14ARF), CDKN2A (p16INK4a), CHEK2, CTNNA1, DICER1, EPCAM (Deletion/duplication testing only), GREM1 (promoter region deletion/duplication testing only), KIT, MEN1, MLH1, MSH2, MSH3,  MSH6, MUTYH, NBN, NF1, NHTL1, PALB2, PDGFRA, PMS2, POLD1, POLE, PTEN, RAD50, RAD51C, RAD51D, RNF43, SDHB, SDHC, SDHD, SMAD4, SMARCA4. STK11, TP53, TSC1, TSC2, and VHL.  The following genes were evaluated for sequence changes only: SDHA and HOXB13 c.251G>A variant only.  (2) MammaPrint obtained from the original breast biopsy read as molecular "low risk", clinical low risk  (3) status post right lumpectomy and sentinel lymph node sampling 04/04/2020 for a pT1c pN0, stage IA invasive ductal carcinoma, grade 1, with negative margins  (a) a total of 4 right axillary lymph nodes were removed.  (4) adjuvant radiation 05/02/2020-06/15/2020: The patient initially received a dose of 50.4 Gy in 28 fractions to the breast using whole-breast tangent fields. This was delivered using a 3-D conformal technique. The patient then received a boost to the seroma. This delivered an additional 10 Gy in 5 fractions using a electron technique. The total dose was 60.4 Gy.  (5) Started Tamoxifen on 07/15/2020  (6) type 2 diabetes mellitus   PLAN: Jossie is here today for f/u of her breast cancer.  She has no clinical or radiographic signs of breast cancer recurrence.  She continues on Tamoxifen with good tolerance and will continue this.    She is due for repeat mammogram in 02/2021.  She and I discussed health maintenance recommendations and I encouraged her to continue with her healthy diet and activity.  Her diabetes control is much  improved.    Demiya will return in 6 months for labs and f/u with Dr. Jana Hakim.  She knows to call for any questions that may arise between now and her next appointment.  We are happy to see her sooner if needed.   Total encounter time 30 minutes.* in face to face visit time, chart review, lab review, order entry, and documentation of the encounter.   Wilber Bihari, NP 05/31/21 10:30 AM Medical Oncology and Hematology [facility] [place], [place] [address] Tel. [phone]    Fax. [phone]   *Total Encounter Time as defined by the Centers for Medicare and Medicaid Services includes, in addition to the face-to-face time of a patient visit (documented in the note above) non-face-to-face time: obtaining and reviewing outside history, ordering and reviewing medications, tests or procedures, care coordination (communications with other health care professionals or caregivers) and documentation in the medical record.

## 2021-06-03 ENCOUNTER — Telehealth: Payer: Self-pay | Admitting: Oncology

## 2021-06-03 NOTE — Telephone Encounter (Signed)
Scheduled per 6/17 los. Called pt and left a msg

## 2021-06-11 ENCOUNTER — Encounter (HOSPITAL_COMMUNITY): Payer: Self-pay

## 2021-07-09 ENCOUNTER — Other Ambulatory Visit: Payer: Self-pay | Admitting: Internal Medicine

## 2021-07-09 ENCOUNTER — Telehealth: Payer: Self-pay | Admitting: *Deleted

## 2021-07-09 MED ORDER — PREDNISONE 50 MG PO TABS: ORAL_TABLET | ORAL | 0 refills | Status: DC

## 2021-07-09 NOTE — Telephone Encounter (Signed)
Patient called about migraines that she has had for a couple of days and she has been unsuccessful in managing it with several doses of Maxalt.  She wanted to know what else she should do to help get some relief.   Routed to Dr Mickeal Skinner to advise.

## 2021-07-11 ENCOUNTER — Telehealth: Payer: Self-pay | Admitting: *Deleted

## 2021-07-11 NOTE — Telephone Encounter (Signed)
Followed up with patient.  She states she started the prednisone on Tuesday and that has helped tremendously.

## 2021-11-27 NOTE — Progress Notes (Signed)
[place]  [phone] [phone]     ID: [name] DOB: 1975/12/16  MR#: [id number]  ZSW#:[id number]  Patient Care Team: Katherina Mires, MD as PCP - General (Family Medicine) [name], MD as Consulting Physician (General Surgery) Kyung Rudd, MD as Consulting Physician (Radiation Oncology) Chauncey Cruel, MD OTHER MD: Pauline Good, NP (psychiatry)   CHIEF COMPLAINT: estrogen receptor positive breast cancer  CURRENT TREATMENT: tamoxifen   INTERVAL HISTORY: [name] returns today for follow up of her estrogen receptor positive breast cancer.  She is taking Tamoxifen daily.  She was having some hot flashes initially but since taking it at bedtime has had no further problems with that.  She has no symptoms from the stroke that she is aware of  Her most recent mammogram from 02/28/2021 at the [facility] was negative, and showed a density of category B.  On 02/28/2021 she had a brain MRI with and without contrast for evaluation of worsening headaches.  This found no abnormality.   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  She had a copper IUD placed but turned out to be allergic to copper and that had to be removed.  Currently she is practicing abstinence.  She is aware of the fact that tamoxifen is not a contraceptive and that we generally recommended to avoid pregnancy while on tamoxifen.  She tends to have midcycle pain which can take her out of work for a day.  She usually takes some fluids and rests.  She does not take any analgesics for this.  A detailed review of systems was otherwise stable.   COVID 19 VACCINATION STATUS: Status post vaccine x2; had COVID May 2022    HISTORY OF CURRENT ILLNESS: From the original intake note:  "[name]" had routine screening mammography on 02/07/2019 showing a possible abnormality in the  right breast. She underwent right diagnostic mammography with tomography and right breast ultrasonography at [facility] on 02/23/2019 showing: breast density category B; probably-benign 0.7 cm mass in right breast at 9 o'clock, likely a fibroadenoma. She returned for short-term right breast ultrasound on 08/29/2019, which showed stability of the probably-benign mass.  She returned for her annual mammography and underwent bilateral diagnostic mammography with tomography and right breast ultrasonography at [facility] on 02/27/2020 showing: breast density category B; new area of distortion in medial inferior right breast; this could not be demonstrated on ultrasound.  There was a stable probably-benign right breast mass at 9 o'clock; no evidence of left breast malignancy or lymphadenopathy.  Accordingly on 03/01/2020 she proceeded to biopsy of the right breast area in question. The pathology from this procedure ([id number]) showed: invasive ductal carcinoma, grade 1; adenosis with calcifications. Prognostic indicators significant for: estrogen receptor, 95% positive with moderate staining intensity and progesterone receptor, 95% positive with strong staining intensity. Proliferation marker Ki67 at 2%. HER2 negative by immunohistochemistry (1+).  Biopsy of the probably-benign mass was performed on 03/05/2020. 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  The patient's subsequent history is as detailed below.   PAST MEDICAL HISTORY: Past Medical History:  Diagnosis Date   Bipolar 1 disorder ([place])    Complication of anesthesia    Diabetes mellitus type 2 in obese [facility])    Family history of prostate cancer    Obesity    PONV (postoperative nausea and vomiting)  Seasonal allergies     PAST SURGICAL HISTORY: Past Surgical History:  Procedure Laterality Date   BREAST LUMPECTOMY WITH RADIOACTIVE SEED AND SENTINEL LYMPH NODE BIOPSY Right 04/04/2020   Procedure: RIGHT BREAST  LUMPECTOMY WITH RADIOACTIVE SEED AND SENTINEL LYMPH NODE BIOPSY;  Surgeon: [name], MD;  Location: [facility];  Service: General;  Laterality: Right;   OOPHORECTOMY  2009    FAMILY HISTORY: Family History  Problem Relation Age of Onset   Heart disease Other    Macular degeneration Other    Mental illness Other        both sides of family   Prostate cancer Paternal Uncle 25   Immunodeficiency Maternal Aunt    Dementia Maternal Grandmother    Diabetes Maternal Aunt   Her father is age 65 and her mother 62 as of 02/2020.  The patient has 3 brothers and 1 sister. She reports prostate cancer in a paternal uncle at age 50.  There is a family history of ovarian cancer.  She denies a family history of breast or pancreatic cancer.   GYNECOLOGIC HISTORY:  No LMP recorded. (Menstrual status: Other). Menarche: 45 years old GX P 0 LMP regular Q 28 d Contraceptive: abstinence (DEC 2022); intolerant of copper IUD HRT n/a  Hysterectomy? no BSO? 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 Husband [name] is a Administrator.  At home is just the 2 of them plus a cat.  They recently moved to a townhouse with a lot of stairs and that is helping her exercise.  She is not a Designer, fashion/clothing.    ADVANCED DIRECTIVES: In the absence of any documentation to the contrary, the patient's spouse is their HCPOA.    HEALTH MAINTENANCE: Social History   Tobacco Use   Smoking status: Former   Smokeless tobacco: Never  Substance Use Topics   Alcohol use: Yes    Comment: social   Drug use: No     Colonoscopy: n/a (age)  PAP: 12/2018, negative  Bone density: n/a (age)   No Known Allergies  Current Outpatient Medications  Medication Sig Dispense Refill   ALPRAZolam (XANAX) 0.25 MG tablet Take 1 tablet (0.25 mg) two times daily as needed for anxiety (Patient not taking: Reported on 01/18/2021) 30  tablet 0   divalproex (DEPAKOTE ER) 500 MG 24 hr tablet Take 1 tablet (500 mg total) by mouth 2 (two) times daily. For mood stabilization 60 tablet 0   metFORMIN (GLUCOPHAGE) 500 MG tablet Take 1 tablet (500 mg total) by mouth 2 (two) times daily with a meal. For diabetes management 180 tablet 4   OLANZapine zydis (ZYPREXA) 5 MG disintegrating tablet Take 0.5 tablets (2.5 mg total) by mouth daily. For mood control     predniSONE (DELTASONE) 50 MG tablet Take 41m by mouth daily x5 days 5 tablet 0   rizatriptan (MAXALT) 5 MG tablet Take 1 tablet (5 mg total) by mouth as needed for migraine. May repeat in 2 hours if needed 30 tablet 0   tamoxifen (NOLVADEX) 20 MG tablet Take 1 tablet (20 mg total) by mouth daily. 90 tablet 4   trihexyphenidyl (ARTANE) 5 MG tablet      No current facility-administered medications for this visit.    OBJECTIVE: White woman who appears well  Vitals:   11/28/21 0929  BP: (!) 146/85  Pulse: 91  Resp: 18  Temp: 97.9 F (  36.6 C)  SpO2: 99%      Body mass index is 38.17 kg/m.   Wt Readings from Last 3 Encounters:  11/28/21 229 lb 6 oz (104 kg)  05/31/21 230 lb 12.8 oz (104.7 kg)  01/18/21 230 lb 9.6 oz (104.6 kg)     ECOG FS:1 - Symptomatic but completely ambulatory  Sclerae unicteric, EOMs intact Wearing a mask No cervical or supraclavicular adenopathy Lungs no rales or rhonchi Heart regular rate and rhythm Abd soft, obese, nontender, positive bowel sounds MSK no focal spinal tenderness, no upper extremity lymphedema Neuro: nonfocal, well oriented, appropriate affect Breasts: The right breast is status postlumpectomy and radiation.  There is no evidence of local recurrence.  The left breast is benign.  Both axillae are benign.   LAB RESULTS:  CMP     Component Value Date/Time   NA 136 05/31/2021 0918   K 4.0 05/31/2021 0918   CL 102 05/31/2021 0918   CO2 23 05/31/2021 0918   GLUCOSE 269 (H) 05/31/2021 0918   BUN 9 05/31/2021 0918    CREATININE 0.36 (L) 05/31/2021 0918   CALCIUM 8.9 05/31/2021 0918   PROT 6.8 05/31/2021 0918   ALBUMIN 3.5 05/31/2021 0918   AST 20 05/31/2021 0918   ALT 19 05/31/2021 0918   ALKPHOS 50 05/31/2021 0918   BILITOT 0.3 05/31/2021 0918   GFRNONAA >60 05/31/2021 0918   GFRAA >60 08/31/2020 1104   GFRAA >60 03/07/2020 0810    No results found for: TOTALPROTELP, ALBUMINELP, A1GS, A2GS, BETS, BETA2SER, GAMS, MSPIKE, SPEI  Lab Results  Component Value Date   WBC 5.9 11/28/2021   NEUTROABS 2.9 11/28/2021   HGB 14.3 11/28/2021   HCT 41.0 11/28/2021   MCV 82.8 11/28/2021   PLT 312 11/28/2021    No results found for: LABCA2  No components found for: JQGBEE100  No results for input(s): INR in the last 168 hours.  No results found for: LABCA2  No results found for: FHQ197  No results found for: JOI325  No results found for: QDI264  No results found for: CA2729  No components found for: HGQUANT  No results found for: CEA1 / No results found for: CEA1   No results found for: AFPTUMOR  No results found for: CHROMOGRNA  No results found for: KPAFRELGTCHN, LAMBDASER, KAPLAMBRATIO (kappa/lambda light chains)  No results found for: HGBA, HGBA2QUANT, HGBFQUANT, HGBSQUAN (Hemoglobinopathy evaluation)   No results found for: LDH  No results found for: IRON, TIBC, IRONPCTSAT (Iron and TIBC)  No results found for: FERRITIN  Urinalysis    Component Value Date/Time   COLORURINE YELLOW 08/11/2014 0654   APPEARANCEUR CLOUDY (A) 08/11/2014 0654   LABSPEC 1.004 (L) 08/11/2014 0654   PHURINE 7.0 08/11/2014 0654   GLUCOSEU NEGATIVE 08/11/2014 0654   HGBUR NEGATIVE 08/11/2014 0654   BILIRUBINUR NEGATIVE 08/11/2014 0654   KETONESUR NEGATIVE 08/11/2014 0654   PROTEINUR NEGATIVE 08/11/2014 0654   UROBILINOGEN 0.2 08/11/2014 0654   NITRITE NEGATIVE 08/11/2014 0654   LEUKOCYTESUR NEGATIVE 08/11/2014 0654    STUDIES: No results found.    ELIGIBLE FOR AVAILABLE RESEARCH  PROTOCOL: AET  ASSESSMENT: 45 y.o. [place] woman status post right breast upper inner quadrant biopsy 03/01/2020 for a clinical TXN0, stage IA invasive ductal carcinoma, grade 1, estrogen and progesterone receptor positive, HER-2 not amplified, with an MIB-1 of 2%.  (1) genetics testing 03/25/2020 through the   [facility] Breast Cancer STAT Panel or Common Hereditary Cancers Panel found no deleterious mutations in ATM, BRCA1, BRCA2,  CDH1, CHEK2, PALB2, PTEN, STK11 and TP53.  The Common Hereditary Cancers Panel offered by [facility] includes sequencing and/or deletion duplication testing of the following 48 genes: APC, ATM, AXIN2, BARD1, BMPR1A, BRCA1, BRCA2, BRIP1, CDH1, CDK4, CDKN2A (p14ARF), CDKN2A (p16INK4a), CHEK2, CTNNA1, DICER1, EPCAM (Deletion/duplication testing only), GREM1 (promoter region deletion/duplication testing only), KIT, MEN1, MLH1, MSH2, MSH3, MSH6, MUTYH, NBN, NF1, NHTL1, PALB2, PDGFRA, PMS2, POLD1, POLE, PTEN, RAD50, RAD51C, RAD51D, RNF43, SDHB, SDHC, SDHD, SMAD4, SMARCA4. STK11, TP53, TSC1, TSC2, and VHL.  The following genes were evaluated for sequence changes only: SDHA and HOXB13 c.251G>A variant only.  (2) MammaPrint obtained from the original breast biopsy read as molecular "low risk", clinical low risk  (3) status post right lumpectomy and sentinel lymph node sampling 04/04/2020 for a pT1c pN0, stage IA invasive ductal carcinoma, grade 1, with negative margins  (a) a total of 4 right axillary lymph nodes were removed.  (4) adjuvant radiation 05/02/2020-06/15/2020: The patient initially received a dose of 50.4 Gy in 28 fractions to the breast using whole-breast tangent fields. This was delivered using a 3-D conformal technique. The patient then received a boost to the seroma. This delivered an additional 10 Gy in 5 fractions using a electron technique. The total dose was 60.4 Gy.  (5) started tamoxifen 07/15/2020  (6) type 2 diabetes mellitus   PLAN: Ieasha is now a year and a  half out from definitive surgery for her breast cancer, with no evidence of disease recurrence.  This is favorable.  She is tolerating tamoxifen well and the plan is to continue that a minimum of 5 years.  She did not have good tolerance of the copper IUD.  While on tamoxifen I am very comfortable with her considering a Mirena IUD or similar if she and her gynecologist would like to try that.  Otherwise of course that barrier methods can be used  For the midcycle discomfort that she has I suggested Tylenol 500 mg with Aleve 220 mg.  She can discuss this further with gynecology once she establishes herself with a new physician since her old gynecologist has retired.  She will have mammography in March.  She will see [place] again in April.  From that point likely we will see her on a yearly basis  She knows to call for any other problem that may develop before the next visit  Total encounter time 25 minutes.Sarajane Jews C. Jaremy Nosal, MD 11/28/21 9:53 AM Medical Oncology and Hematology [facility] [address], [place] [address] Tel. [phone]    Fax. [phone]   I, Wilburn Mylar, am acting as scribe for Dr. Virgie Dad. Emerly Prak.  I, Lurline Del MD, have reviewed the above documentation for accuracy and completeness, and I agree with the above.    *Total Encounter Time as defined by the Centers for Medicare and Medicaid Services includes, in addition to the face-to-face time of a patient visit (documented in the note above) non-face-to-face time: obtaining and reviewing outside history, ordering and reviewing medications, tests or procedures, care coordination (communications with other health care professionals or caregivers) and documentation in the medical record.

## 2021-11-28 ENCOUNTER — Other Ambulatory Visit: Payer: Self-pay

## 2021-11-28 ENCOUNTER — Inpatient Hospital Stay: Payer: Managed Care, Other (non HMO) | Admitting: Oncology

## 2021-11-28 ENCOUNTER — Inpatient Hospital Stay: Payer: Managed Care, Other (non HMO) | Attending: Oncology

## 2021-11-28 VITALS — BP 146/85 | HR 91 | Temp 97.9°F | Resp 18 | Ht 65.0 in | Wt 229.4 lb

## 2021-11-28 DIAGNOSIS — D241 Benign neoplasm of right breast: Secondary | ICD-10-CM

## 2021-11-28 DIAGNOSIS — Z79810 Long term (current) use of selective estrogen receptor modulators (SERMs): Secondary | ICD-10-CM | POA: Diagnosis not present

## 2021-11-28 DIAGNOSIS — Z170 Estrogen receptor positive status [ER+]: Secondary | ICD-10-CM | POA: Diagnosis not present

## 2021-11-28 DIAGNOSIS — E119 Type 2 diabetes mellitus without complications: Secondary | ICD-10-CM | POA: Diagnosis not present

## 2021-11-28 DIAGNOSIS — C50811 Malignant neoplasm of overlapping sites of right female breast: Secondary | ICD-10-CM | POA: Diagnosis present

## 2021-11-28 DIAGNOSIS — E11649 Type 2 diabetes mellitus with hypoglycemia without coma: Secondary | ICD-10-CM

## 2021-11-28 DIAGNOSIS — C50211 Malignant neoplasm of upper-inner quadrant of right female breast: Secondary | ICD-10-CM | POA: Diagnosis not present

## 2021-11-28 DIAGNOSIS — Z6841 Body Mass Index (BMI) 40.0 and over, adult: Secondary | ICD-10-CM

## 2021-11-28 DIAGNOSIS — E1169 Type 2 diabetes mellitus with other specified complication: Secondary | ICD-10-CM

## 2021-11-28 LAB — COMPREHENSIVE METABOLIC PANEL
ALT: 13 U/L (ref 0–44)
AST: 12 U/L — ABNORMAL LOW (ref 15–41)
Albumin: 3.5 g/dL (ref 3.5–5.0)
Alkaline Phosphatase: 50 U/L (ref 38–126)
Anion gap: 12 (ref 5–15)
BUN: 12 mg/dL (ref 6–20)
CO2: 24 mmol/L (ref 22–32)
Calcium: 8.7 mg/dL — ABNORMAL LOW (ref 8.9–10.3)
Chloride: 103 mmol/L (ref 98–111)
Creatinine, Ser: 0.64 mg/dL (ref 0.44–1.00)
GFR, Estimated: >60 mL/min (ref >60)
Glucose, Bld: 257 mg/dL — ABNORMAL HIGH (ref 70–99)
Potassium: 3.8 mmol/L (ref 3.5–5.1)
Sodium: 139 mmol/L (ref 135–145)
Total Bilirubin: 0.3 mg/dL (ref 0.3–1.2)
Total Protein: 6.7 g/dL (ref 6.5–8.1)

## 2021-11-28 LAB — CBC WITH DIFFERENTIAL/PLATELET
Abs Immature Granulocytes: 0.02 10*3/uL (ref 0.00–0.07)
Basophils Absolute: 0 10*3/uL (ref 0.0–0.1)
Basophils Relative: 1 %
Eosinophils Absolute: 0.2 10*3/uL (ref 0.0–0.5)
Eosinophils Relative: 3 %
HCT: 41 % (ref 36.0–46.0)
Hemoglobin: 14.3 g/dL (ref 12.0–15.0)
Immature Granulocytes: 0 %
Lymphocytes Relative: 38 %
Lymphs Abs: 2.2 10*3/uL (ref 0.7–4.0)
MCH: 28.9 pg (ref 26.0–34.0)
MCHC: 34.9 g/dL (ref 30.0–36.0)
MCV: 82.8 fL (ref 80.0–100.0)
Monocytes Absolute: 0.5 10*3/uL (ref 0.1–1.0)
Monocytes Relative: 9 %
Neutro Abs: 2.9 10*3/uL (ref 1.7–7.7)
Neutrophils Relative %: 49 %
Platelets: 312 10*3/uL (ref 150–400)
RBC: 4.95 MIL/uL (ref 3.87–5.11)
RDW: 12.1 % (ref 11.5–15.5)
WBC: 5.9 10*3/uL (ref 4.0–10.5)
nRBC: 0 % (ref 0.0–0.2)

## 2021-11-28 MED ORDER — METFORMIN HCL 500 MG PO TABS: 500.0000 mg | ORAL_TABLET | Freq: Two times a day (BID) | ORAL | 4 refills | Status: DC

## 2021-12-13 ENCOUNTER — Telehealth: Payer: Self-pay | Admitting: Oncology

## 2021-12-13 NOTE — Telephone Encounter (Signed)
Called to update patient on the change of the 4/13 appointment time from 0900 to 0845. Left message. Patient will be mailed an updated calendar.

## 2021-12-31 ENCOUNTER — Other Ambulatory Visit: Payer: Self-pay | Admitting: *Deleted

## 2021-12-31 DIAGNOSIS — C50211 Malignant neoplasm of upper-inner quadrant of right female breast: Secondary | ICD-10-CM

## 2021-12-31 MED ORDER — TAMOXIFEN CITRATE 20 MG PO TABS
20.0000 mg | ORAL_TABLET | Freq: Every day | ORAL | 4 refills | Status: DC
Start: 2021-12-31 — End: 2023-03-26

## 2022-01-13 ENCOUNTER — Other Ambulatory Visit: Payer: Self-pay | Admitting: Adult Health

## 2022-01-13 DIAGNOSIS — C50211 Malignant neoplasm of upper-inner quadrant of right female breast: Secondary | ICD-10-CM

## 2022-02-04 ENCOUNTER — Other Ambulatory Visit (HOSPITAL_COMMUNITY)
Admission: RE | Admit: 2022-02-04 | Discharge: 2022-02-04 | Disposition: A | Discharge status: Home / Self Care | Payer: Managed Care, Other (non HMO) | Source: Ambulatory Visit | Attending: Obstetrics & Gynecology | Admitting: Obstetrics & Gynecology

## 2022-02-04 ENCOUNTER — Other Ambulatory Visit (HOSPITAL_BASED_OUTPATIENT_CLINIC_OR_DEPARTMENT_OTHER): Payer: Self-pay

## 2022-02-04 ENCOUNTER — Ambulatory Visit (HOSPITAL_BASED_OUTPATIENT_CLINIC_OR_DEPARTMENT_OTHER): Payer: Managed Care, Other (non HMO) | Admitting: Obstetrics & Gynecology

## 2022-02-04 ENCOUNTER — Encounter (HOSPITAL_BASED_OUTPATIENT_CLINIC_OR_DEPARTMENT_OTHER): Payer: Self-pay | Admitting: Obstetrics & Gynecology

## 2022-02-04 ENCOUNTER — Other Ambulatory Visit: Payer: Self-pay

## 2022-02-04 VITALS — BP 147/95 | HR 102 | Ht 64.5 in | Wt 231.8 lb

## 2022-02-04 DIAGNOSIS — I10 Essential (primary) hypertension: Secondary | ICD-10-CM

## 2022-02-04 DIAGNOSIS — Z01419 Encounter for gynecological examination (general) (routine) without abnormal findings: Secondary | ICD-10-CM

## 2022-02-04 DIAGNOSIS — B3731 Acute candidiasis of vulva and vagina: Secondary | ICD-10-CM

## 2022-02-04 DIAGNOSIS — Z124 Encounter for screening for malignant neoplasm of cervix: Secondary | ICD-10-CM

## 2022-02-04 DIAGNOSIS — R102 Pelvic and perineal pain: Secondary | ICD-10-CM

## 2022-02-04 DIAGNOSIS — Z170 Estrogen receptor positive status [ER+]: Secondary | ICD-10-CM

## 2022-02-04 DIAGNOSIS — C50211 Malignant neoplasm of upper-inner quadrant of right female breast: Secondary | ICD-10-CM

## 2022-02-04 DIAGNOSIS — E1169 Type 2 diabetes mellitus with other specified complication: Secondary | ICD-10-CM

## 2022-02-04 DIAGNOSIS — E669 Obesity, unspecified: Secondary | ICD-10-CM

## 2022-02-04 MED ORDER — TERCONAZOLE 0.4 % VA CREA
1.0000 | TOPICAL_CREAM | Freq: Every day | VAGINAL | 0 refills | Status: DC
Start: 2022-02-04 — End: 2022-02-26
  Filled 2022-02-04: qty 45, 7d supply, fill #0

## 2022-02-04 MED ORDER — FLUCONAZOLE 150 MG PO TABS
ORAL_TABLET | ORAL | 0 refills | Status: DC
  Filled 2022-02-04: qty 1, 1d supply, fill #1
  Filled 2022-02-04: qty 2, 3d supply, fill #0

## 2022-02-04 NOTE — Patient Instructions (Signed)
Phexxi non hormonal

## 2022-02-04 NOTE — Progress Notes (Addendum)
46 y.o. G0 Married White or Caucasian female here for annual exam/new patient exam.  H/o breast cancer 04/04/20.  Had lumpectomy and radiation.  Had 4 nodes removed that were negative.    Former patient of Dr. Sander Radon and Dr. Suzzette Righter.    Has used cooper IUD in the past.  Has allergy to cooper.  IUD was removed.  Not interested in pregnancy.  Has diabetes.  Last hba1c was 12.5.    Having mid cycle pain that has been the last 4 or 5 months.  Cycles are regular.  Flow hasn't changed.    Considering permanent contraception.    Patient's last menstrual period was 01/18/2022 (approximate).          Sexually active: Yes.    The current method of family planning is non-penetrating intercourse.     Upstream - 02/04/22 8119       Pregnancy Intention Screening   Does the patient want to become pregnant in the next year? No    Does the patient's partner want to become pregnant in the next year? No    Would the patient like to discuss contraceptive options today? Yes      Contraception Wrap Up   Contraception Counseling Provided Yes            Exercising: very active job but not formal exercise Smoker:  no  Health Maintenance: Pap:  01/10/2019 Negative History of abnormal Pap:  no MMG:  01/2021 Colonoscopy:  Guidelines reviewed Screening Labs: does with oncology and Dr. Doreene Nest   reports that she has quit smoking. She has never used smokeless tobacco. She reports current alcohol use. She reports that she does not use drugs.  Past Medical History:  Diagnosis Date   Bipolar 1 disorder ([place])    Complication of anesthesia    Diabetes mellitus type 2 in obese ([facility])    Family history of prostate cancer    Obesity    PONV (postoperative nausea and vomiting)    Seasonal allergies     Past Surgical History:  Procedure Laterality Date   BREAST LUMPECTOMY WITH RADIOACTIVE SEED AND SENTINEL LYMPH NODE BIOPSY Right 04/04/2020   Procedure: RIGHT BREAST LUMPECTOMY WITH RADIOACTIVE  SEED AND SENTINEL LYMPH NODE BIOPSY;  Surgeon: [name], MD;  Location: [facility] [place];  Service: General;  Laterality: Right;   OOPHORECTOMY  2009    Current Outpatient Medications  Medication Sig Dispense Refill   ALPRAZolam (XANAX) 0.25 MG tablet Take 1 tablet (0.25 mg) two times daily as needed for anxiety 30 tablet 0   divalproex (DEPAKOTE ER) 500 MG 24 hr tablet Take 1 tablet (500 mg total) by mouth 2 (two) times daily. For mood stabilization 60 tablet 0   metFORMIN (GLUCOPHAGE) 500 MG tablet Take 1 tablet (500 mg total) by mouth 2 (two) times daily with a meal. For diabetes management 180 tablet 4   OLANZapine zydis (ZYPREXA) 5 MG disintegrating tablet Take 0.5 tablets (2.5 mg total) by mouth daily. For mood control     rizatriptan (MAXALT) 5 MG tablet Take 1 tablet (5 mg total) by mouth as needed for migraine. May repeat in 2 hours if needed 30 tablet 0   tamoxifen (NOLVADEX) 20 MG tablet Take 1 tablet (20 mg total) by mouth daily. 90 tablet 4   trihexyphenidyl (ARTANE) 5 MG tablet      predniSONE (DELTASONE) 50 MG tablet Take 50mg  by mouth daily x5 days (Patient not taking: Reported on 02/04/2022) 5 tablet 0  No current facility-administered medications for this visit.    Family History  Problem Relation Age of Onset   Heart disease Other    Macular degeneration Other    Mental illness Other        both sides of family   Prostate cancer Paternal Uncle 27   Immunodeficiency Maternal Aunt    Dementia Maternal Grandmother    Diabetes Maternal Aunt     Review of Systems  All other systems reviewed and are negative.  Exam:   BP (!) 147/95 (BP Location: Left Arm, Patient Position: Sitting, Cuff Size: Large)    Pulse (!) 102    Ht 5' 4.5" (1.638 m) Comment: reported   Wt 231 lb 12.8 oz (105.1 kg)    LMP 01/18/2022 (Approximate)    BMI 39.17 kg/m   Height: 5' 4.5" (163.8 cm) (reported)  General appearance: alert, cooperative and appears stated age Head:  Normocephalic, without obvious abnormality, atraumatic Neck: no adenopathy, supple, symmetrical, trachea midline and thyroid normal to inspection and palpation Lungs: clear to auscultation bilaterally Breasts: right breast with well healed incision and radiation changes, thickening along the inner side of her breast, no LAD; left breast without masses, skin changes, nipple discharge, LAD Heart: regular rate and rhythm Abdomen: soft, non-tender; bowel sounds normal; no masses,  no organomegaly Extremities: extremities normal, atraumatic, no cyanosis or edema Skin: Skin color, texture, turgor normal. No rashes or lesions Lymph nodes: Cervical, supraclavicular, and axillary nodes normal. No abnormal inguinal nodes palpated Neurologic: Grossly normal   Pelvic: External genitalia:  significant erythema and whitish exudate              Urethra:  normal appearing urethra with no masses, tenderness or lesions              Bartholins and Skenes: normal                 Vagina: normal appearing vagina with normal color and no discharge, no lesions              Cervix: no lesions              Pap taken: Yes.   Bimanual Exam:  Uterus:  normal size, contour, position, consistency, mobility, non-tender              Adnexa: normal adnexa and no mass, fullness, tenderness               Rectovaginal: Confirms               Anus:  normal sphincter tone, no lesions  Chaperone, Octaviano Batty, CMA, was present for exam.  Assessment/Plan: 1. Well woman exam with routine gynecological exam - pap and HR HPV obtained today - MMG scheduled - colon cancer screening guidelines reviewed - vaccines reviewed/updated - lab work is done with Dr. Everlene Farrier  2. Cervical cancer screening - Cytology - PAP( [place])  3. White coat syndrome with diagnosis of hypertension  4. Malignant neoplasm of upper-inner quadrant of right breast in female, estrogen receptor positive ([place]) - followed by oncology  5. Diabetes  mellitus type 2 in obese [facility]) - followed by Dr. Everlene Farrier  6. Pelvic pain - US PELVIS TRANSVAGINAL NON-OB (TV ONLY); Future  7. Yeast vaginitis - fluconazole (DIFLUCAN) 150 MG tablet; 1 tab po every 72 hours for 3 doses  Dispense: 3 tablet; Refill: 0 - terconazole (TERAZOL 7) 0.4 % vaginal cream; Place 1 applicator vaginally at bedtime. Use for 7  nights.  Can also apply topically in AM for symptom relief.  Dispense: 45 g; Refill: 0 - advised pt to give update after treatment

## 2022-02-07 LAB — CYTOLOGY - PAP
Comment: NEGATIVE
Diagnosis: NEGATIVE
High risk HPV: NEGATIVE

## 2022-02-26 ENCOUNTER — Other Ambulatory Visit (HOSPITAL_BASED_OUTPATIENT_CLINIC_OR_DEPARTMENT_OTHER): Payer: Self-pay

## 2022-02-26 ENCOUNTER — Ambulatory Visit (INDEPENDENT_AMBULATORY_CARE_PROVIDER_SITE_OTHER): Payer: Managed Care, Other (non HMO)

## 2022-02-26 ENCOUNTER — Other Ambulatory Visit: Payer: Self-pay

## 2022-02-26 ENCOUNTER — Ambulatory Visit (HOSPITAL_BASED_OUTPATIENT_CLINIC_OR_DEPARTMENT_OTHER): Payer: Managed Care, Other (non HMO) | Admitting: Obstetrics & Gynecology

## 2022-02-26 ENCOUNTER — Other Ambulatory Visit (HOSPITAL_COMMUNITY)
Admission: RE | Admit: 2022-02-26 | Discharge: 2022-02-26 | Disposition: A | Discharge status: Home / Self Care | Payer: Managed Care, Other (non HMO) | Source: Ambulatory Visit | Attending: Obstetrics & Gynecology | Admitting: Obstetrics & Gynecology

## 2022-02-26 ENCOUNTER — Encounter (HOSPITAL_BASED_OUTPATIENT_CLINIC_OR_DEPARTMENT_OTHER): Payer: Self-pay | Admitting: Obstetrics & Gynecology

## 2022-02-26 VITALS — BP 139/96 | HR 86 | Ht 64.0 in | Wt 232.2 lb

## 2022-02-26 DIAGNOSIS — R102 Pelvic and perineal pain unspecified side: Secondary | ICD-10-CM

## 2022-02-26 DIAGNOSIS — M6289 Other specified disorders of muscle: Secondary | ICD-10-CM

## 2022-02-26 DIAGNOSIS — B3731 Acute candidiasis of vulva and vagina: Secondary | ICD-10-CM

## 2022-02-26 DIAGNOSIS — E1165 Type 2 diabetes mellitus with hyperglycemia: Secondary | ICD-10-CM | POA: Diagnosis not present

## 2022-02-26 MED ORDER — FLUCONAZOLE 150 MG PO TABS
ORAL_TABLET | ORAL | 0 refills | Status: DC
  Filled 2022-02-26: qty 2, 3d supply, fill #0
  Filled 2022-02-26: qty 1, 1d supply, fill #1

## 2022-02-26 MED ORDER — TERCONAZOLE 0.4 % VA CREA
1.0000 | TOPICAL_CREAM | Freq: Every day | VAGINAL | 0 refills | Status: DC
  Filled 2022-02-26: qty 45, 7d supply, fill #0

## 2022-02-27 NOTE — Progress Notes (Signed)
GYNECOLOGY  VISIT ? ?CC:   follow up after ultrasound and recheck yeast vaginitis ? ?HPI: ?46 y.o. G0P0000 Married White or Caucasian female here for discussion of ultrasound results.  Pt aware ultrasound is basically normal without any significant uterine abnormality.  Ovaries contain follicles that are normal.  Treatment for pelvic pain and likely some pelvic spasm discussed.  Pt is very open to pelvic PT.  Treatment with prn muscle relaxer will be initiated as well.  Will start with pm dosing due to possible sedation. ? ?At last exam, pt has significant vulvovaginal candida.  Pt reports this is much better but not fully resolved.  Comfortable with additional evaluation today.  Pt reports she is really not itching now so it is better, just not resolved. ? ? ?Patient Active Problem List  ? Diagnosis Date Noted  ? Migraine headache without aura 01/18/2021  ? Genetic testing 03/26/2020  ? Morbid obesity with BMI of 40.0-44.9, adult ([place]) 03/07/2020  ? Type 2 diabetes mellitus, uncontrolled 03/07/2020  ? Fibroadenoma of right breast 03/07/2020  ? Family history of prostate cancer   ? Malignant neoplasm of upper-inner quadrant of right breast in female, estrogen receptor positive ([place]) 03/05/2020  ? Embedded earring of left ear 08/11/2014  ? Diabetes mellitus type 2 in obese [facility])   ? Seasonal allergies   ? Mood disorder ([place]) 08/08/2014  ? Bipolar 1 disorder, manic, moderate ([place]) 08/08/2014  ? Bipolar 1 disorder ([place]) 08/08/2014  ? ? ?Past Medical History:  ?Diagnosis Date  ? Bipolar 1 disorder ([place])   ? Complication of anesthesia   ? Diabetes mellitus type 2 in obese [facility])   ? Family history of prostate cancer   ? Obesity   ? PONV (postoperative nausea and vomiting)   ? Seasonal allergies   ? ? ?Past Surgical History:  ?Procedure Laterality Date  ? BREAST LUMPECTOMY WITH RADIOACTIVE SEED AND SENTINEL LYMPH NODE BIOPSY Right 04/04/2020  ? Procedure: RIGHT BREAST LUMPECTOMY WITH RADIOACTIVE SEED AND SENTINEL LYMPH  NODE BIOPSY;  Surgeon: [name], MD;  Location: [place];  Service: General;  Laterality: Right;  ? SALPINGECTOMY  12/16/2007  ? right partial, was a paratubal cyst  ? ? ?MEDS:   ?Current Outpatient Medications on File Prior to Visit  ?Medication Sig Dispense Refill  ? ALPRAZolam (XANAX) 0.25 MG tablet Take 1 tablet (0.25 mg) two times daily as needed for anxiety 30 tablet 0  ? divalproex (DEPAKOTE ER) 500 MG 24 hr tablet Take 1 tablet (500 mg total) by mouth 2 (two) times daily. For mood stabilization 60 tablet 0  ? metFORMIN (GLUCOPHAGE) 500 MG tablet Take 1 tablet (500 mg total) by mouth 2 (two) times daily with a meal. For diabetes management 180 tablet 4  ? OLANZapine zydis (ZYPREXA) 5 MG disintegrating tablet Take 0.5 tablets (2.5 mg total) by mouth daily. For mood control    ? rizatriptan (MAXALT) 5 MG tablet Take 1 tablet (5 mg total) by mouth as needed for migraine. May repeat in 2 hours if needed 30 tablet 0  ? tamoxifen (NOLVADEX) 20 MG tablet Take 1 tablet (20 mg total) by mouth daily. 90 tablet 4  ? trihexyphenidyl (ARTANE) 5 MG tablet     ? ?No current facility-administered medications on file prior to visit.  ? ? ?ALLERGIES: Latex and Copper-containing compounds ? ?Family History  ?Problem Relation Age of Onset  ? Heart disease Other   ? Macular degeneration Other   ? Mental illness Other   ?  both sides of family  ? Prostate cancer Paternal Uncle 59  ? Immunodeficiency Maternal Aunt   ? Dementia Maternal Grandmother   ? Diabetes Maternal Aunt   ? ? ?SH:  married, non smoker ? ?Review of Systems  ?Genitourinary:   ?     Vaginal itching ?Vaginal discharge  ? ?PHYSICAL EXAMINATION:   ? ?BP (!) 139/96 (BP Location: Right Arm, Patient Position: Sitting, Cuff Size: Large)   Pulse 86   Ht '5\' 4"'$  (1.626 m) Comment: reported  Wt 232 lb 3.2 oz (105.3 kg)   BMI 39.86 kg/m?     ?General appearance: alert, cooperative and appears stated age ?Lymph:  no inguinal LAD noted ? ?Pelvic:  External genitalia:  mild inner labia majora erythema but much improved ?             Urethra:  normal appearing urethra with no masses, tenderness or lesions ?             Bartholins and Skenes: normal    ?             Vagina: normal appearing vagina with normal color and discharge, watery vaginal discharge with some whitish clumpy discharge present ?           ? ?Assessment/Plan: ?1. Pelvic pain ?- will consider starting muscle relaxer for prn treatment of pain ? ?2. Pelvic floor dysfunction ?- will refer to pelvic PT.  Pt very interested in getting this started ?- Ambulatory referral to Physical Therapy ? ?3. Yeast vaginitis ?- fluconazole (DIFLUCAN) 150 MG tablet; 1 tab po every 72 hours for 3 doses  Dispense: 3 tablet; Refill: 0 ?- terconazole (TERAZOL 7) 0.4 % vaginal cream; Place 1 applicator vaginally at bedtime for 7 nights; can also apply topically in AM for symptom relief.  Dispense: 45 g; Refill: 0 ?- Cervicovaginal ancillary only( [place])  ? ?4.  Diabetes ?- poorly controlled with last hba1c of 12.5.  On metformin. ? ?

## 2022-02-28 ENCOUNTER — Encounter (HOSPITAL_BASED_OUTPATIENT_CLINIC_OR_DEPARTMENT_OTHER): Payer: Self-pay | Admitting: Obstetrics & Gynecology

## 2022-02-28 ENCOUNTER — Telehealth (HOSPITAL_BASED_OUTPATIENT_CLINIC_OR_DEPARTMENT_OTHER): Payer: Self-pay

## 2022-02-28 LAB — CERVICOVAGINAL ANCILLARY ONLY
Bacterial Vaginitis (gardnerella): NEGATIVE
Candida Glabrata: NEGATIVE
Candida Vaginitis: POSITIVE — AB
Comment: NEGATIVE
Comment: NEGATIVE
Comment: NEGATIVE

## 2022-02-28 NOTE — Telephone Encounter (Signed)
Patient called today wanting to know if she will call her something in for pelvic pain. She states that you mentioned a "muscle relaxant" that she can take. She has already made her appointment for pelvic floor therapy. It is scheduled for 03/12/2022. Please advise. tbw ?

## 2022-03-03 ENCOUNTER — Other Ambulatory Visit (HOSPITAL_BASED_OUTPATIENT_CLINIC_OR_DEPARTMENT_OTHER): Payer: Self-pay | Admitting: Obstetrics & Gynecology

## 2022-03-03 ENCOUNTER — Other Ambulatory Visit (HOSPITAL_BASED_OUTPATIENT_CLINIC_OR_DEPARTMENT_OTHER): Payer: Self-pay

## 2022-03-03 MED ORDER — CYCLOBENZAPRINE HCL 10 MG PO TABS
ORAL_TABLET | ORAL | 1 refills | Status: DC
  Filled 2022-03-03: qty 30, 30d supply, fill #0
  Filled 2022-04-16: qty 30, 30d supply, fill #1

## 2022-03-05 ENCOUNTER — Ambulatory Visit
Admission: RE | Admit: 2022-03-05 | Discharge: 2022-03-05 | Disposition: A | Discharge status: Home / Self Care | Payer: Managed Care, Other (non HMO) | Source: Ambulatory Visit | Attending: Adult Health | Admitting: Adult Health

## 2022-03-05 DIAGNOSIS — Z170 Estrogen receptor positive status [ER+]: Secondary | ICD-10-CM

## 2022-03-12 ENCOUNTER — Encounter: Payer: Self-pay | Admitting: Physical Therapy

## 2022-03-12 ENCOUNTER — Ambulatory Visit: Payer: Managed Care, Other (non HMO) | Attending: Obstetrics & Gynecology | Admitting: Physical Therapy

## 2022-03-12 DIAGNOSIS — R293 Abnormal posture: Secondary | ICD-10-CM | POA: Diagnosis present

## 2022-03-12 DIAGNOSIS — R279 Unspecified lack of coordination: Secondary | ICD-10-CM | POA: Insufficient documentation

## 2022-03-12 DIAGNOSIS — M6281 Muscle weakness (generalized): Secondary | ICD-10-CM | POA: Diagnosis present

## 2022-03-12 DIAGNOSIS — M62838 Other muscle spasm: Secondary | ICD-10-CM | POA: Insufficient documentation

## 2022-03-12 NOTE — Therapy (Signed)
?OUTPATIENT PHYSICAL THERAPY FEMALE PELVIC EVALUATION ? ? ?Patient Name: [name] ?MRN: [id number] ?DOB:[birth year], 46 y.o., female ?Today's Date: 03/12/2022 ? ? PT End of Session - 03/12/22 1436   ? ? Visit Number 1   ? Date for PT Re-Evaluation 06/12/22   ? Authorization Type cigna   ? PT Start Time 2637   ? PT Stop Time 1525   ? PT Time Calculation (min) 40 min   ? Activity Tolerance Patient tolerated treatment well   ? Behavior During Therapy [facility] for tasks assessed/performed   ? ?  ?  ? ?  ? ? ?Past Medical History:  ?Diagnosis Date  ? Bipolar 1 disorder ([place])   ? Complication of anesthesia   ? Diabetes mellitus type 2 in obese [facility])   ? Family history of prostate cancer   ? Obesity   ? PONV (postoperative nausea and vomiting)   ? Seasonal allergies   ? ?Past Surgical History:  ?Procedure Laterality Date  ? BREAST LUMPECTOMY Right 04/04/2020  ? BREAST LUMPECTOMY WITH RADIOACTIVE SEED AND SENTINEL LYMPH NODE BIOPSY Right 04/04/2020  ? Procedure: RIGHT BREAST LUMPECTOMY WITH RADIOACTIVE SEED AND SENTINEL LYMPH NODE BIOPSY;  Surgeon: [name], MD;  Location: [place];  Service: General;  Laterality: Right;  ? SALPINGECTOMY  12/16/2007  ? right partial, was a paratubal cyst  ? ?Patient Active Problem List  ? Diagnosis Date Noted  ? Migraine headache without aura 01/18/2021  ? Genetic testing 03/26/2020  ? Morbid obesity with BMI of 40.0-44.9, adult ([place]) 03/07/2020  ? Type 2 diabetes mellitus, uncontrolled 03/07/2020  ? Fibroadenoma of right breast 03/07/2020  ? Family history of prostate cancer   ? Malignant neoplasm of upper-inner quadrant of right breast in female, estrogen receptor positive ([place]) 03/05/2020  ? Embedded earring of left ear 08/11/2014  ? Diabetes mellitus type 2 in obese [facility])   ? Seasonal allergies   ? Mood disorder ([place]) 08/08/2014  ? Bipolar 1 disorder, manic, moderate ([place]) 08/08/2014  ? Bipolar 1 disorder ([place]) 08/08/2014  ? ? ?PCP: Katherina Mires,  MD ? ?REFERRING PROVIDER: Megan Salon, MD ? ?REFERRING DIAG: M62.89 (ICD-10-CM) - Pelvic floor dysfunction ? ?THERAPY DIAG:  ?No diagnosis found. ? ?ONSET DATE: 09/2021 ? ?SUBJECTIVE:                                                                                                                                                                                          ? ?SUBJECTIVE STATEMENT: ?Pain at Lt lower abdomen and pelvis region especially mid cycle. Pt has history of cyst removal and Rt Fallopian tube removal.  Pt has attempted various medications and these have not helped until recently given FLEXERIL which helps but makes her very tired. Breast cancer and treatment with this 02/2020 completed surgery with lumpectomy 2021 and radiation completed 6 weeks. Recent follow up continues to be cancer free.  ? ?Fluid intake: Yes: 4-6 22 oz bottles of water per day   ? ?Patient confirms identification and approves PT to assess pelvic floor and treatment Yes ? ? ?PAIN:  ?Are you having pain? No ?NPRS scale: 8/10 ?Pain location:  Lt lower pelvis/abdomen  ? ?Pain type: sharp and throbbing ?Pain description: intermittent  ? ?Aggravating factors: cycle progression (mid cycle) ?Relieving factors: finishing this portion of the cycle ? ?PRECAUTIONS: breast cancer, T2DM ? ?WEIGHT BEARING RESTRICTIONS No ? ?FALLS:  ?Has patient fallen in last 6 months? No ? ?LIVING ENVIRONMENT: ?Lives with: lives with their spouse ? ? ?OCCUPATION: Medical laboratory scientific officer at [employer] ? ?PLOF: Independent ? ?PATIENT GOALS to have less pain ? ?PERTINENT HISTORY:  ?vulvovaginal candida,  ?Migraine headache without aura 01/18/2021   ? Genetic testing 03/26/2020  ? Morbid obesity with BMI of 40.0-44.9, adult ([place]) 03/07/2020  ? Type 2 diabetes mellitus, uncontrolled 03/07/2020  ? Fibroadenoma of right breast 03/07/2020  ? Family history of prostate cancer    ? Malignant neoplasm of upper-inner quadrant of right breast in female, estrogen receptor  positive ([place]) 03/05/2020  ? Embedded earring of left ear 08/11/2014  ? Diabetes mellitus type 2 in obese [facility])    ? Seasonal allergies    ? Mood disorder ([place]) 08/08/2014  ? Bipolar 1 disorder, manic, moderate ([place]) 08/08/2014  ? Bipolar 1 disorder ([place])   ? ?Sexual abuse: No ? ?BOWEL MOVEMENT ?Pain with bowel movement: No ?Type of bowel movement:Type (Bristol Stool Scale) 4, Frequency 1-2 times per day, and Strain Yes (sometimes) ?Fully empty rectum: Yes:   ?Leakage: No ?Pads: No ?Fiber supplement: No ? ?URINATION ?Pain with urination: No ?Fully empty bladder: Yes:   ?Stream: Strong ?Urgency: No ?Frequency: no  ?Leakage:  no  ?Pads: No ? ?INTERCOURSE ?Pain with intercourse:  None  but does feel tighter sometimes ? ? ?PREGNANCY ?Vaginal deliveries 0 ?C-section deliveries 0 ?Not pregnant  ? ?PROLAPSE ?None ? ? ? ?OBJECTIVE:  ? ?DIAGNOSTIC FINDINGS:  ?As per Dr. Sabra Heck note prior to appointment 02/26/22: ultrasound normal without any significant uterine abnormality.  Ovaries contain follicles that are normal. ? ?COGNITION: ? Overall cognitive status: Within functional limits for tasks assessed   ?  ?SENSATION: ? Light touch: Appears intact ? Proprioception: Appears intact ? ?MUSCLE LENGTH: ?Bil adductors and hamstrings limited by 50% ? ? ?POSTURE:  ?Rounded shoulders, posterior pelvic tilt ? ?LUMBARAROM/PROM ? ?Limited in 25% with extension and bil side bending all others WFL ? ?LE ROM: ? ?WFL bil hips, knees, and ankles ? ?LE MMT: ? ?Bil hip abduction 3+/5 and all others 4+/5 ? ?PELVIC MMT: ? ?No emotional/communication barriers or cognitive limitation. Patient is motivated to learn. Patient understands and agrees with treatment goals and plan. PT explains patient will be examined in standing, sitting, and lying down to see how their muscles and joints work. When they are ready, they will be asked to remove their underwear so PT can examine their perineum. The patient is also given the option of providing their own  chaperone as one is not provided in our facility. The patient also has the right and is explained the right to defer or refuse any part of the evaluation or treatment  including the internal exam. With the patient's consent, PT will use one gloved finger to gently assess the muscles of the pelvic floor, seeing how well it contracts and relaxes and if there is muscle symmetry. After, the patient will get dressed and PT and patient will discuss exam findings and plan of care. PT and patient discuss plan of care, schedule, attendance policy and HEP activities.   ?MMT  ?03/12/2022  ?Vaginal 4/5; isometrics 4s; 8 reps  ?Internal Anal Sphincter   ?External Anal Sphincter   ?Puborectalis   ?Diastasis Recti   ?(Blank rows = not tested) ? ?      PALPATION: ?  General  TTP at lower abdominal quadrants bil and fascial restrictions noted bil lower quadrants ? ?              External Perineal Exam redness noted at bil labia majora and minora, mild dryness ?              ?              Internal Pelvic Floor no TTP ? ?TONE: ?WFL ? ?PROLAPSE: ?Possible Grade 1 in hooklying  ? ?TODAY'S TREATMENT  ?EVAL 03/12/2022: Examination completed, findings reviewed, pt educated on POC, HEP. Pt motivated to participate in PT and agreeable to attempt recommendations.  ? ? ? ?PATIENT EDUCATION:  ?Education details: [id number] ?Person educated: Patient ?Education method: Explanation, Demonstration, Tactile cues, Verbal cues, and Handouts ?Education comprehension: verbalized understanding and returned demonstration ? ? ?HOME EXERCISE PROGRAM: ?[id number] ? ?ASSESSMENT: ? ?CLINICAL IMPRESSION: ?Patient is a 46 y.o. female  who was seen today for physical therapy evaluation and treatment for pelvic floor dysfunction with severe intense Lt lateral abdominal/pelvic pain with mid cycle. Pt found to decreased flexibility in spine and bil hips, fascial restrictions in lower abdomen, and bil hip weakness. Pt consented to internal pelvic floor assessment vaginally  and found to have mild weakness, decreased endurance and coordination. Pt would benefit from additional PT to further address deficits.   ? ? ?OBJECTIVE IMPAIRMENTS decreased coordination, decreased endurance, decreas

## 2022-03-26 ENCOUNTER — Other Ambulatory Visit: Payer: Self-pay

## 2022-03-26 DIAGNOSIS — Z170 Estrogen receptor positive status [ER+]: Secondary | ICD-10-CM

## 2022-03-27 ENCOUNTER — Inpatient Hospital Stay: Payer: Managed Care, Other (non HMO) | Admitting: Hematology and Oncology

## 2022-03-27 ENCOUNTER — Other Ambulatory Visit: Payer: Managed Care, Other (non HMO)

## 2022-03-27 ENCOUNTER — Ambulatory Visit: Payer: Managed Care, Other (non HMO) | Admitting: Physical Therapy

## 2022-03-27 ENCOUNTER — Ambulatory Visit: Payer: Managed Care, Other (non HMO) | Admitting: Hematology and Oncology

## 2022-03-27 ENCOUNTER — Encounter: Payer: Self-pay | Admitting: Hematology and Oncology

## 2022-03-27 ENCOUNTER — Inpatient Hospital Stay: Payer: Managed Care, Other (non HMO) | Attending: Hematology and Oncology

## 2022-03-27 ENCOUNTER — Other Ambulatory Visit: Payer: Self-pay

## 2022-03-27 VITALS — BP 171/94 | HR 87 | Temp 97.5°F | Resp 18 | Ht 64.0 in | Wt 226.8 lb

## 2022-03-27 DIAGNOSIS — Z79810 Long term (current) use of selective estrogen receptor modulators (SERMs): Secondary | ICD-10-CM | POA: Insufficient documentation

## 2022-03-27 DIAGNOSIS — Z170 Estrogen receptor positive status [ER+]: Secondary | ICD-10-CM | POA: Insufficient documentation

## 2022-03-27 DIAGNOSIS — E119 Type 2 diabetes mellitus without complications: Secondary | ICD-10-CM | POA: Diagnosis not present

## 2022-03-27 DIAGNOSIS — C50911 Malignant neoplasm of unspecified site of right female breast: Secondary | ICD-10-CM | POA: Diagnosis present

## 2022-03-27 DIAGNOSIS — C50211 Malignant neoplasm of upper-inner quadrant of right female breast: Secondary | ICD-10-CM

## 2022-03-27 LAB — CMP (CANCER CENTER ONLY)
ALT: 14 U/L (ref 0–44)
AST: 13 U/L — ABNORMAL LOW (ref 15–41)
Albumin: 3.9 g/dL (ref 3.5–5.0)
Alkaline Phosphatase: 47 U/L (ref 38–126)
Anion gap: 7 (ref 5–15)
BUN: 13 mg/dL (ref 6–20)
CO2: 27 mmol/L (ref 22–32)
Calcium: 9.2 mg/dL (ref 8.9–10.3)
Chloride: 100 mmol/L (ref 98–111)
Creatinine: 0.47 mg/dL (ref 0.44–1.00)
GFR, Estimated: >60 mL/min (ref >60)
Glucose, Bld: 311 mg/dL — ABNORMAL HIGH (ref 70–99)
Potassium: 4.1 mmol/L (ref 3.5–5.1)
Sodium: 134 mmol/L — ABNORMAL LOW (ref 135–145)
Total Bilirubin: 0.3 mg/dL (ref 0.3–1.2)
Total Protein: 6.9 g/dL (ref 6.5–8.1)

## 2022-03-27 LAB — CBC WITH DIFFERENTIAL (CANCER CENTER ONLY)
Abs Immature Granulocytes: 0.01 10*3/uL (ref 0.00–0.07)
Basophils Absolute: 0 10*3/uL (ref 0.0–0.1)
Basophils Relative: 0 %
Eosinophils Absolute: 0.2 10*3/uL (ref 0.0–0.5)
Eosinophils Relative: 4 %
HCT: 43.2 % (ref 36.0–46.0)
Hemoglobin: 14.7 g/dL (ref 12.0–15.0)
Immature Granulocytes: 0 %
Lymphocytes Relative: 43 %
Lymphs Abs: 2.2 10*3/uL (ref 0.7–4.0)
MCH: 28.3 pg (ref 26.0–34.0)
MCHC: 34 g/dL (ref 30.0–36.0)
MCV: 83.2 fL (ref 80.0–100.0)
Monocytes Absolute: 0.4 10*3/uL (ref 0.1–1.0)
Monocytes Relative: 8 %
Neutro Abs: 2.3 10*3/uL (ref 1.7–7.7)
Neutrophils Relative %: 45 %
Platelet Count: 246 10*3/uL (ref 150–400)
RBC: 5.19 MIL/uL — ABNORMAL HIGH (ref 3.87–5.11)
RDW: 12.2 % (ref 11.5–15.5)
WBC Count: 5.2 10*3/uL (ref 4.0–10.5)
nRBC: 0 % (ref 0.0–0.2)

## 2022-03-27 NOTE — Progress Notes (Signed)
?[place]  ?[phone] [phone]  ? ? ? ?ID: [name] DOB: Nov 20, 1976  MR#: [id number]  TZG#:[id number] ? ?Patient Care Team: ?Amy Mires, MD as PCP - General (Family Medicine) ?Amy Keens, MD as Consulting Physician (General Surgery) ?Amy Rudd, MD as Consulting Physician (Radiation Oncology) ?Amy Pike, MD ?OTHER MD: Amy Good, NP (psychiatry) ? ? ?CHIEF COMPLAINT: estrogen receptor positive breast cancer ? ?CURRENT TREATMENT: tamoxifen ? ? ?INTERVAL HISTORY: ?[name] returns today for follow up of her estrogen receptor positive breast cancer. ?She is taking Tamoxifen daily.  She is dealing with intermittent hot flashes, sporadic. ?Her most recent mammogram from 3/222023 with no mammographic evidence of malignancy. ?She is following up with Dr [name] from [facility], has some mid cycle pains, doing pelvic floor therapy. ?The pain is intense she says, aleve and tylenol doesn't help at all, flexeril sometimes does but makes her sleepy. ?She is also displeased with the cosmetic outcome and the asymmetry. ?Rest of the pertinent 10 point ROS reviewed and negative. ? ? COVID 19 VACCINATION STATUS: Status post vaccine x2; had COVID May 2022 ?  ? ?HISTORY OF CURRENT ILLNESS: ?From the original intake note: ? ?"[name]" had routine screening mammography on 02/07/2019 showing a possible abnormality in the right breast. She underwent right diagnostic mammography with tomography and right breast ultrasonography at [facility] on 02/23/2019 showing: breast density category B; probably-benign 0.7 cm mass in right breast at 9 o'clock, likely a fibroadenoma. She returned for short-term right breast ultrasound on 08/29/2019, which showed stability of the probably-benign mass. ? ?She returned for her annual mammography and underwent bilateral diagnostic mammography with tomography and right breast ultrasonography at [facility] on 02/27/2020 showing: breast density  category B; new area of distortion in medial inferior right breast; this could not be demonstrated on ultrasound.  There was a stable probably-benign right breast mass at 9 o'clock; no evidence of left breast malignancy or lymphadenopathy. ? ?Accordingly on 03/01/2020 she proceeded to biopsy of the right breast area in question. The pathology from this procedure ([id number]) showed: invasive ductal carcinoma, grade 1; adenosis with calcifications. Prognostic indicators significant for: estrogen receptor, 95% positive with moderate staining intensity and progesterone receptor, 95% positive with strong staining intensity. Proliferation marker Ki67 at 2%. HER2 negative by immunohistochemistry (1+). ? ?Biopsy of the probably-benign mass was performed on 03/05/2020. Pathology [id number]) confirmed this to be a fibroadenoma. ? ?The patient's subsequent history is as detailed below. ? ? ?PAST MEDICAL HISTORY: ?Past Medical History:  ?Diagnosis Date  ? Bipolar 1 disorder ([place])   ? Complication of anesthesia   ? Diabetes mellitus type 2 in obese [facility])   ? Family history of prostate cancer   ? Obesity   ? PONV (postoperative nausea and vomiting)   ? Seasonal allergies   ? ? ?PAST SURGICAL HISTORY: ?Past Surgical History:  ?Procedure Laterality Date  ? BREAST LUMPECTOMY Right 04/04/2020  ? BREAST LUMPECTOMY WITH RADIOACTIVE SEED AND SENTINEL LYMPH NODE BIOPSY Right 04/04/2020  ? Procedure: RIGHT BREAST LUMPECTOMY WITH RADIOACTIVE SEED AND SENTINEL LYMPH NODE BIOPSY;  Surgeon: Amy Keens, MD;  Location: [place];  Service: General;  Laterality: Right;  ? SALPINGECTOMY  12/16/2007  ? right partial, was a paratubal cyst  ? ? ?FAMILY HISTORY: ?Family History  ?Problem Relation Age of Onset  ? Immunodeficiency Maternal Aunt   ? Diabetes Maternal Aunt   ? Breast cancer Paternal Aunt   ? Prostate cancer Paternal Uncle  55  ? Dementia Maternal Grandmother   ? Heart disease Other   ? Macular degeneration Other   ?  Mental illness Other   ?     both sides of family  ?Her father is age 75 and her mother 30 as of 02/2020.  The patient has 3 brothers and 1 sister. She reports prostate cancer in a paternal uncle at age 6.  There is a family history of ovarian cancer.  She denies a family history of breast or pancreatic cancer. ? ? ?GYNECOLOGIC HISTORY:  ?No LMP recorded. ?Menarche: 46 years old ?GX P 0 ?LMP regular Q 28 d ?Contraceptive: abstinence (DEC 2022); intolerant of copper IUD ?HRT n/a  ?Hysterectomy? no ?BSO? no ? ? 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 Husband [name] "Ulice Dash" is a Administrator.  At home is just the 2 of them plus a cat.  They recently moved to a townhouse with a lot of stairs and that is helping her exercise.  She is not a Designer, fashion/clothing. ?  ? ADVANCED DIRECTIVES: In the absence of any documentation to the contrary, the patient's spouse is their HCPOA.  ? ? ?HEALTH MAINTENANCE: ?Social History  ? ?Tobacco Use  ? Smoking status: Former  ? Smokeless tobacco: Never  ?Substance Use Topics  ? Alcohol use: Yes  ?  Comment: social  ? Drug use: No  ? ? ? Colonoscopy: n/a (age) ? PAP: 12/2018, negative ? Bone density: n/a (age) ?  ?Allergies  ?Allergen Reactions  ? Latex Itching  ? Copper-Containing Compounds Swelling  ? ? ?Current Outpatient Medications  ?Medication Sig Dispense Refill  ? ALPRAZolam (XANAX) 0.25 MG tablet Take 1 tablet (0.25 mg) two times daily as needed for anxiety 30 tablet 0  ? cyclobenzaprine (FLEXERIL) 10 MG tablet 1 tablet nightly as needed for pelvic floor pain. 30 tablet 1  ? divalproex (DEPAKOTE ER) 500 MG 24 hr tablet Take 1 tablet (500 mg total) by mouth 2 (two) times daily. For mood stabilization 60 tablet 0  ? fluconazole (DIFLUCAN) 150 MG tablet 1 tab po every 72 hours for 3 doses 3 tablet 0  ? metFORMIN (GLUCOPHAGE) 500 MG tablet Take 1 tablet (500 mg total) by mouth 2 (two) times daily with a meal. For  diabetes management 180 tablet 4  ? OLANZapine zydis (ZYPREXA) 5 MG disintegrating tablet Take 0.5 tablets (2.5 mg total) by mouth daily. For mood control    ? rizatriptan (MAXALT) 5 MG tablet Take 1 tablet (5 mg total) by mouth as needed for migraine. May repeat in 2 hours if needed 30 tablet 0  ? tamoxifen (NOLVADEX) 20 MG tablet Take 1 tablet (20 mg total) by mouth daily. 90 tablet 4  ? terconazole (TERAZOL 7) 0.4 % vaginal cream Place 1 applicator vaginally at bedtime for 7 nights; can also apply topically in AM for symptom relief. 45 g 0  ? trihexyphenidyl (ARTANE) 5 MG tablet     ? ?No current facility-administered medications for this visit.  ? ? ?OBJECTIVE: White woman who appears well ? ?Vitals:  ? 03/27/22 0909  ?BP: (!) 171/94  ?Pulse: 87  ?Resp: 18  ?Temp: (!) 97.5 ?F (36.4 ?C)  ?SpO2: 98%  ? ?   Body mass index is 38.93 kg/m?.   ?Wt Readings from Last 3 Encounters:  ?03/27/22 226 lb 12.8 oz (102.9 kg)  ?02/26/22 232 lb 3.2 oz (105.3 kg)  ?02/04/22  231 lb 12.8 oz (105.1 kg)  ? ?  ECOG FS:1 - Symptomatic but completely ambulatory ? ?Physical Exam ?Constitutional:   ?   Appearance: Normal appearance.  ?Chest:  ?   Comments: Bilateral breasts inspected.  Right breast postlumpectomy and radiation with scarring on exam.  Likely patient mentioned that the asymmetry is quite noticeable.  No palpable pathologic masses or regional adenopathy.  Left breast appears normal to inspection and palpation. ?Musculoskeletal:  ?   Cervical back: Normal range of motion and neck supple. No rigidity.  ?Lymphadenopathy:  ?   Cervical: No cervical adenopathy.  ?Neurological:  ?   Mental Status: She is alert.  ? ? ? ? ?LAB RESULTS: ? ?CMP  ?   ?Component Value Date/Time  ? NA 139 11/28/2021 0922  ? K 3.8 11/28/2021 0922  ? CL 103 11/28/2021 0922  ? CO2 24 11/28/2021 0922  ? GLUCOSE 257 (H) 11/28/2021 6681  ? BUN 12 11/28/2021 0922  ? CREATININE 0.64 11/28/2021 0922  ? CREATININE 0.36 (L) 05/31/2021 5947  ? CALCIUM 8.7 (L)  11/28/2021 0761  ? PROT 6.7 11/28/2021 0922  ? ALBUMIN 3.5 11/28/2021 0922  ? AST 12 (L) 11/28/2021 5183  ? AST 20 05/31/2021 0918  ? ALT 13 11/28/2021 0922  ? ALT 19 05/31/2021 0918  ? ALKPHOS 50 11/28/2021

## 2022-04-02 ENCOUNTER — Ambulatory Visit: Payer: Managed Care, Other (non HMO) | Attending: Obstetrics & Gynecology | Admitting: Physical Therapy

## 2022-04-02 DIAGNOSIS — M62838 Other muscle spasm: Secondary | ICD-10-CM | POA: Insufficient documentation

## 2022-04-02 DIAGNOSIS — R279 Unspecified lack of coordination: Secondary | ICD-10-CM | POA: Diagnosis present

## 2022-04-02 DIAGNOSIS — M6281 Muscle weakness (generalized): Secondary | ICD-10-CM | POA: Diagnosis present

## 2022-04-02 NOTE — Patient Instructions (Signed)
[email or web address]   

## 2022-04-02 NOTE — Therapy (Addendum)
OUTPATIENT PHYSICAL THERAPY TREATMENT NOTE   Patient Name: [name] MRN: [id number] DOB:[birth year], 46 y.o., female Today's Date: 04/02/2022  PCP: Katherina Mires, MD REFERRING PROVIDER: Megan Salon, MD  END OF SESSION:   PT End of Session - 04/02/22 0855     Visit Number 2    Date for PT Re-Evaluation 06/12/22    Authorization Type cigna    PT Start Time [phone]    PT Stop Time 0930    PT Time Calculation (min) 41 min    Activity Tolerance Patient tolerated treatment well    Behavior During Therapy WFL for tasks assessed/performed             Past Medical History:  Diagnosis Date   Bipolar 1 disorder ([place])    Complication of anesthesia    Diabetes mellitus type 2 in obese ([place])    Family history of prostate cancer    Obesity    PONV (postoperative nausea and vomiting)    Seasonal allergies    Past Surgical History:  Procedure Laterality Date   BREAST LUMPECTOMY Right 04/04/2020   BREAST LUMPECTOMY WITH RADIOACTIVE SEED AND SENTINEL LYMPH NODE BIOPSY Right 04/04/2020   Procedure: RIGHT BREAST LUMPECTOMY WITH RADIOACTIVE SEED AND SENTINEL LYMPH NODE BIOPSY;  Surgeon: [name], MD;  Location: [facility];  Service: General;  Laterality: Right;   SALPINGECTOMY  12/16/2007   right partial, was a paratubal cyst   Patient Active Problem List   Diagnosis Date Noted   Migraine headache without aura 01/18/2021   Genetic testing 03/26/2020   Morbid obesity with BMI of 40.0-44.9, adult ([place]) 03/07/2020   Type 2 diabetes mellitus, uncontrolled 03/07/2020   Fibroadenoma of right breast 03/07/2020   Family history of prostate cancer    Malignant neoplasm of upper-inner quadrant of right breast in female, estrogen receptor positive ([place]) 03/05/2020   Embedded earring of left ear 08/11/2014   Diabetes mellitus type 2 in obese ([place])    Seasonal allergies    Mood disorder ([place]) 08/08/2014   Bipolar 1 disorder, manic, moderate ([place]) 08/08/2014    Bipolar 1 disorder ([place]) 08/08/2014    REFERRING DIAG: M62.89 (ICD-10-CM) - Pelvic floor dysfunction  THERAPY DIAG:  Unspecified lack of coordination  Other muscle spasm  Muscle weakness (generalized)  PERTINENT HISTORY: vulvovaginal candida,  Migraine headache without aura 01/18/2021     Genetic testing 03/26/2020   Morbid obesity with BMI of 40.0-44.9, adult ([place]) 03/07/2020   Type 2 diabetes mellitus, uncontrolled 03/07/2020   Fibroadenoma of right breast 03/07/2020   Family history of prostate cancer     Malignant neoplasm of upper-inner quadrant of right breast in female, estrogen receptor positive ([place]) 03/05/2020   Embedded earring of left ear 08/11/2014   Diabetes mellitus type 2 in obese ([facility])     Seasonal allergies     Mood disorder ([place]) 08/08/2014   Bipolar 1 disorder, manic, moderate ([place]) 08/08/2014   Bipolar 1 disorder ([facility])      Sexual abuse: No   PRECAUTIONS: breast cancer, T2DM  SUBJECTIVE: Pt having a little pain as this is close to ovulation and pt reports she has several questions about HEP and breathing mechanics.   PAIN:  Are you having pain? Yes: NPRS scale: 4/10 Pain location: lower abdomen more so at Lt Pain description: throbbing     OBJECTIVE:    DIAGNOSTIC FINDINGS:  As per Dr. Sabra Heck note prior to appointment 02/26/22: ultrasound normal without any significant uterine abnormality.  Ovaries contain follicles that are normal.   COGNITION:            Overall cognitive status: Within functional limits for tasks assessed                          SENSATION:            Light touch: Appears intact            Proprioception: Appears intact   MUSCLE LENGTH: Bil adductors and hamstrings limited by 50%     POSTURE:  Rounded shoulders, posterior pelvic tilt   LUMBARAROM/PROM   Limited in 25% with extension and bil side bending all others WFL   LE ROM:   WFL bil hips, knees, and ankles   LE MMT:   Bil hip abduction 3+/5 and all  others 4+/5   PELVIC MMT:   No emotional/communication barriers or cognitive limitation. Patient is motivated to learn. Patient understands and agrees with treatment goals and plan. PT explains patient will be examined in standing, sitting, and lying down to see how their muscles and joints work. When they are ready, they will be asked to remove their underwear so PT can examine their perineum. The patient is also given the option of providing their own chaperone as one is not provided in our facility. The patient also has the right and is explained the right to defer or refuse any part of the evaluation or treatment including the internal exam. With the patient's consent, PT will use one gloved finger to gently assess the muscles of the pelvic floor, seeing how well it contracts and relaxes and if there is muscle symmetry. After, the patient will get dressed and PT and patient will discuss exam findings and plan of care. PT and patient discuss plan of care, schedule, attendance policy and HEP activities.   MMT   03/12/2022  Vaginal 4/5; isometrics 4s; 8 reps  Internal Anal Sphincter    External Anal Sphincter    Puborectalis    Diastasis Recti    (Blank rows = not tested)         PALPATION:   General  TTP at lower abdominal quadrants bil and fascial restrictions noted bil lower quadrants                 External Perineal Exam redness noted at bil labia majora and minora, mild dryness                             Internal Pelvic Floor no TTP   TONE: WFL   PROLAPSE: Possible Grade 1 in hooklying    TODAY'S TREATMENT   04/02/2022: Pt educated on breathing mechanics and HEP X10 cat/cow X10 supine TA activations with diaphragmatic breathing  2x45s seated happy baby 2x45s mini cobra 2x45s childs pose 2x30s each sidebending child pose 2x30s hip flexor stretch supine  EVAL 03/12/2022: Examination completed, findings reviewed, pt educated on POC, HEP. Pt motivated to participate in PT and  agreeable to attempt recommendations.        PATIENT EDUCATION:  Education details: [id number] Person educated: Patient Education method: Explanation, Demonstration, Tactile cues, Verbal cues, and Handouts Education comprehension: verbalized understanding and returned demonstration     HOME EXERCISE PROGRAM: [id number]   ASSESSMENT:   CLINICAL IMPRESSION: Patient presents with mild pain in lt lower abdomen, reported 1/10 pain at end of session. Pt session focused on  answering all questions about HEP and therex for completing HEP during session to insure carry over for home. Pt reported this was helpful for her as she did need additional cues for technique. Pt would benefit from additional PT to further address deficits.       OBJECTIVE IMPAIRMENTS decreased coordination, decreased endurance, decreased strength, increased fascial restrictions, increased muscle spasms, impaired flexibility, improper body mechanics, postural dysfunction, and pain.    ACTIVITY LIMITATIONS cleaning, community activity, driving, occupation, yard work, and shopping.    PERSONAL FACTORS Time since onset of injury/illness/exacerbation and 1 comorbidity: h/o CA, radiation treatment, chronic pain  are also affecting patient's functional outcome.      REHAB POTENTIAL: Good   CLINICAL DECISION MAKING: Stable/uncomplicated   EVALUATION COMPLEXITY: Low     GOALS: Goals reviewed with patient? Yes   SHORT TERM GOALS: Target date: 04/09/2022   Pt to be I with HEP. Baseline: Goal status: INITIAL   2.  Pt to report decreased pain to no more than 5/10 with mid cycle spasm for improved tolerance to mobility for work and ADLs Baseline:  Goal status: INITIAL   3.  Pt to report 100% without cues for proper manual abdominal massage to improve tissue tightness as needed to decrease pain at abdomen.  Baseline:  Goal status: INITIAL   LONG TERM GOALS: Target date:  06/12/2022   Pt to be I with advanced  HEP. Baseline:  Goal status: INITIAL   2.  Pt to demonstrate at least 5/5 bil hip strength in all directions for improved pelvic stability and functional squats without pain.   Baseline:  Goal status: INITIAL   3.  Pt to report no more than 3/10 pain with mid cycle spasm for improved tolerance to mobility for work and ADLs.  Baseline:  Goal status: INITIAL       PLAN: PT FREQUENCY:  once every other week   PT DURATION:  6 sessions   PLANNED INTERVENTIONS: Therapeutic exercises, Therapeutic activity, Neuromuscular re-education, Patient/Family education, Joint mobilization, Spinal mobilization, Cryotherapy, Moist heat, Manual lymph drainage, scar mobilization, Taping, Biofeedback, and Manual therapy   PLAN FOR NEXT SESSION: manual work at abdomen, relaxation techniques, go over HEP    Stacy Gardner, PT, DPT 04/19/239:31 AM     PHYSICAL THERAPY DISCHARGE SUMMARY  Visits from Start of Care: 2  Current functional level related to goals / functional outcomes: Requesting DC due to financials    Remaining deficits: Unable to formal reassess   Education / Equipment: HEP   Patient agrees to discharge. Patient goals were not met. Patient is being discharged due to financial reasons. Thank you for the referral.   Stacy Gardner, PT, DPT 08/06/2309:11 AM

## 2022-04-16 ENCOUNTER — Other Ambulatory Visit (HOSPITAL_BASED_OUTPATIENT_CLINIC_OR_DEPARTMENT_OTHER): Payer: Self-pay

## 2022-04-16 ENCOUNTER — Encounter: Payer: Managed Care, Other (non HMO) | Admitting: Physical Therapy

## 2022-04-16 MED ORDER — METFORMIN HCL ER 500 MG PO TB24
ORAL_TABLET | ORAL | 1 refills | Status: DC
  Filled 2022-04-16: qty 360, 90d supply, fill #0
  Filled 2023-03-12: qty 120, 30d supply, fill #1
  Filled 2023-04-14: qty 120, 30d supply, fill #2

## 2022-04-16 MED ORDER — OZEMPIC (0.25 OR 0.5 MG/DOSE) 2 MG/3ML ~~LOC~~ SOPN
PEN_INJECTOR | SUBCUTANEOUS | 5 refills | Status: DC
  Filled 2022-04-16: qty 3, 28d supply, fill #0
  Filled 2022-04-17: qty 3, 34d supply, fill #0
  Filled 2022-04-25 – 2022-05-22 (×2): qty 3, 28d supply, fill #0
  Filled 2022-06-27: qty 3, 28d supply, fill #1
  Filled ????-??-??: fill #0

## 2022-04-17 ENCOUNTER — Other Ambulatory Visit (HOSPITAL_BASED_OUTPATIENT_CLINIC_OR_DEPARTMENT_OTHER): Payer: Self-pay

## 2022-04-17 ENCOUNTER — Other Ambulatory Visit (HOSPITAL_BASED_OUTPATIENT_CLINIC_OR_DEPARTMENT_OTHER): Payer: Self-pay | Admitting: Obstetrics & Gynecology

## 2022-04-17 DIAGNOSIS — B3731 Acute candidiasis of vulva and vagina: Secondary | ICD-10-CM

## 2022-04-18 ENCOUNTER — Other Ambulatory Visit (HOSPITAL_BASED_OUTPATIENT_CLINIC_OR_DEPARTMENT_OTHER): Payer: Self-pay

## 2022-04-21 ENCOUNTER — Other Ambulatory Visit (HOSPITAL_BASED_OUTPATIENT_CLINIC_OR_DEPARTMENT_OTHER): Payer: Self-pay

## 2022-04-22 ENCOUNTER — Other Ambulatory Visit (HOSPITAL_BASED_OUTPATIENT_CLINIC_OR_DEPARTMENT_OTHER): Payer: Self-pay

## 2022-04-23 ENCOUNTER — Other Ambulatory Visit (HOSPITAL_BASED_OUTPATIENT_CLINIC_OR_DEPARTMENT_OTHER): Payer: Self-pay

## 2022-04-24 ENCOUNTER — Other Ambulatory Visit (HOSPITAL_BASED_OUTPATIENT_CLINIC_OR_DEPARTMENT_OTHER): Payer: Self-pay

## 2022-04-24 ENCOUNTER — Other Ambulatory Visit (HOSPITAL_BASED_OUTPATIENT_CLINIC_OR_DEPARTMENT_OTHER): Payer: Self-pay | Admitting: Obstetrics & Gynecology

## 2022-04-24 DIAGNOSIS — B3731 Acute candidiasis of vulva and vagina: Secondary | ICD-10-CM

## 2022-04-25 ENCOUNTER — Other Ambulatory Visit (HOSPITAL_BASED_OUTPATIENT_CLINIC_OR_DEPARTMENT_OTHER): Payer: Self-pay

## 2022-04-25 MED ORDER — FLUCONAZOLE 150 MG PO TABS
ORAL_TABLET | ORAL | 0 refills | Status: DC
  Filled 2022-04-25: qty 2, 2d supply, fill #0
  Filled 2022-04-25: qty 1, 1d supply, fill #1

## 2022-04-28 ENCOUNTER — Other Ambulatory Visit (HOSPITAL_BASED_OUTPATIENT_CLINIC_OR_DEPARTMENT_OTHER): Payer: Self-pay

## 2022-04-28 MED ORDER — FREESTYLE LIBRE READER DEVI
0 refills | Status: AC
  Filled 2022-06-27: qty 1, fill #0

## 2022-04-28 MED ORDER — FREESTYLE LIBRE 3 SENSOR MISC
11 refills | Status: AC
  Filled 2022-04-28 – 2022-05-22 (×2): qty 2, 28d supply, fill #0
  Filled 2022-06-27: qty 2, 28d supply, fill #1

## 2022-05-05 ENCOUNTER — Other Ambulatory Visit (HOSPITAL_BASED_OUTPATIENT_CLINIC_OR_DEPARTMENT_OTHER): Payer: Self-pay

## 2022-05-07 ENCOUNTER — Encounter: Payer: Managed Care, Other (non HMO) | Admitting: Physical Therapy

## 2022-05-21 ENCOUNTER — Encounter: Payer: Managed Care, Other (non HMO) | Admitting: Physical Therapy

## 2022-05-22 ENCOUNTER — Other Ambulatory Visit (HOSPITAL_BASED_OUTPATIENT_CLINIC_OR_DEPARTMENT_OTHER): Payer: Self-pay

## 2022-05-29 ENCOUNTER — Other Ambulatory Visit: Payer: Managed Care, Other (non HMO)

## 2022-05-29 ENCOUNTER — Ambulatory Visit: Payer: Managed Care, Other (non HMO) | Admitting: Adult Health

## 2022-06-04 ENCOUNTER — Encounter: Payer: Managed Care, Other (non HMO) | Admitting: Physical Therapy

## 2022-06-25 ENCOUNTER — Encounter: Payer: Managed Care, Other (non HMO) | Admitting: Physical Therapy

## 2022-06-27 ENCOUNTER — Other Ambulatory Visit (HOSPITAL_BASED_OUTPATIENT_CLINIC_OR_DEPARTMENT_OTHER): Payer: Self-pay

## 2022-06-30 ENCOUNTER — Other Ambulatory Visit (HOSPITAL_BASED_OUTPATIENT_CLINIC_OR_DEPARTMENT_OTHER): Payer: Self-pay

## 2022-06-30 MED ORDER — OZEMPIC (1 MG/DOSE) 4 MG/3ML ~~LOC~~ SOPN
1.0000 mg | PEN_INJECTOR | SUBCUTANEOUS | 5 refills | Status: DC
  Filled 2022-06-30: qty 3, 28d supply, fill #0

## 2022-07-08 ENCOUNTER — Other Ambulatory Visit (HOSPITAL_BASED_OUTPATIENT_CLINIC_OR_DEPARTMENT_OTHER): Payer: Self-pay

## 2022-07-18 ENCOUNTER — Other Ambulatory Visit (HOSPITAL_BASED_OUTPATIENT_CLINIC_OR_DEPARTMENT_OTHER): Payer: Self-pay

## 2022-07-18 MED ORDER — TRIAMCINOLONE ACETONIDE 0.1 % EX CREA: TOPICAL_CREAM | CUTANEOUS | 0 refills | Status: AC

## 2022-10-24 ENCOUNTER — Other Ambulatory Visit (HOSPITAL_BASED_OUTPATIENT_CLINIC_OR_DEPARTMENT_OTHER): Payer: Self-pay

## 2022-10-24 MED ORDER — LOSARTAN POTASSIUM 25 MG PO TABS
ORAL_TABLET | ORAL | 0 refills | Status: DC
  Filled 2022-10-24: qty 30, 30d supply, fill #0
  Filled 2022-11-24: qty 30, 30d supply, fill #1
  Filled 2022-12-26 – 2022-12-27 (×2): qty 30, 30d supply, fill #2

## 2022-10-24 MED ORDER — FLUOCINONIDE 0.05 % EX CREA
TOPICAL_CREAM | Freq: Two times a day (BID) | CUTANEOUS | 1 refills | Status: DC
  Filled 2022-10-24: qty 30, 30d supply, fill #0
  Filled 2022-12-26: qty 30, 30d supply, fill #1
  Filled 2023-04-07: qty 30, 30d supply, fill #2

## 2022-11-24 ENCOUNTER — Other Ambulatory Visit (HOSPITAL_BASED_OUTPATIENT_CLINIC_OR_DEPARTMENT_OTHER): Payer: Self-pay

## 2022-11-26 ENCOUNTER — Encounter (HOSPITAL_BASED_OUTPATIENT_CLINIC_OR_DEPARTMENT_OTHER): Payer: Self-pay | Admitting: Pharmacist

## 2022-11-26 ENCOUNTER — Other Ambulatory Visit (HOSPITAL_BASED_OUTPATIENT_CLINIC_OR_DEPARTMENT_OTHER): Payer: Self-pay

## 2022-11-26 MED ORDER — MOUNJARO 5 MG/0.5ML ~~LOC~~ SOAJ
5.0000 mg | SUBCUTANEOUS | 0 refills | Status: DC
Start: 2022-11-26 — End: 2024-03-31
  Filled 2023-02-09 (×2): qty 2, 28d supply, fill #0
  Filled 2023-03-06: qty 2, 28d supply, fill #1

## 2022-11-26 MED ORDER — MOUNJARO 2.5 MG/0.5ML ~~LOC~~ SOAJ
2.5000 mg | SUBCUTANEOUS | 0 refills | Status: DC
  Filled 2022-11-26 – 2023-01-16 (×3): qty 2, 28d supply, fill #0

## 2022-11-27 ENCOUNTER — Other Ambulatory Visit (HOSPITAL_BASED_OUTPATIENT_CLINIC_OR_DEPARTMENT_OTHER): Payer: Self-pay

## 2022-12-02 ENCOUNTER — Other Ambulatory Visit (HOSPITAL_BASED_OUTPATIENT_CLINIC_OR_DEPARTMENT_OTHER): Payer: Self-pay

## 2022-12-02 ENCOUNTER — Other Ambulatory Visit: Payer: Self-pay

## 2022-12-03 ENCOUNTER — Other Ambulatory Visit: Payer: Self-pay

## 2022-12-10 ENCOUNTER — Other Ambulatory Visit (HOSPITAL_COMMUNITY): Payer: Self-pay

## 2022-12-20 ENCOUNTER — Other Ambulatory Visit (HOSPITAL_BASED_OUTPATIENT_CLINIC_OR_DEPARTMENT_OTHER): Payer: Self-pay

## 2022-12-26 ENCOUNTER — Other Ambulatory Visit (HOSPITAL_BASED_OUTPATIENT_CLINIC_OR_DEPARTMENT_OTHER): Payer: Self-pay

## 2022-12-26 ENCOUNTER — Other Ambulatory Visit: Payer: Self-pay

## 2022-12-27 ENCOUNTER — Other Ambulatory Visit: Payer: Self-pay

## 2022-12-27 ENCOUNTER — Other Ambulatory Visit (HOSPITAL_BASED_OUTPATIENT_CLINIC_OR_DEPARTMENT_OTHER): Payer: Self-pay

## 2022-12-31 ENCOUNTER — Other Ambulatory Visit (HOSPITAL_BASED_OUTPATIENT_CLINIC_OR_DEPARTMENT_OTHER): Payer: Self-pay

## 2023-01-08 ENCOUNTER — Other Ambulatory Visit (HOSPITAL_BASED_OUTPATIENT_CLINIC_OR_DEPARTMENT_OTHER): Payer: Self-pay

## 2023-01-16 ENCOUNTER — Other Ambulatory Visit (HOSPITAL_BASED_OUTPATIENT_CLINIC_OR_DEPARTMENT_OTHER): Payer: Self-pay

## 2023-01-26 ENCOUNTER — Other Ambulatory Visit: Payer: Self-pay

## 2023-01-26 ENCOUNTER — Other Ambulatory Visit (HOSPITAL_BASED_OUTPATIENT_CLINIC_OR_DEPARTMENT_OTHER): Payer: Self-pay

## 2023-01-26 MED ORDER — LOSARTAN POTASSIUM 25 MG PO TABS
ORAL_TABLET | ORAL | 1 refills | Status: DC
  Filled 2023-01-26: qty 30, 30d supply, fill #0
  Filled 2023-03-04: qty 90, 90d supply, fill #1
  Filled 2023-06-04: qty 30, 30d supply, fill #2
  Filled 2023-07-06: qty 30, 30d supply, fill #3

## 2023-01-29 ENCOUNTER — Encounter (HOSPITAL_BASED_OUTPATIENT_CLINIC_OR_DEPARTMENT_OTHER): Payer: Self-pay | Admitting: Pharmacist

## 2023-01-29 ENCOUNTER — Other Ambulatory Visit (HOSPITAL_BASED_OUTPATIENT_CLINIC_OR_DEPARTMENT_OTHER): Payer: Self-pay

## 2023-01-29 MED ORDER — EUCRISA 2 % EX OINT
TOPICAL_OINTMENT | CUTANEOUS | 1 refills | Status: AC
  Filled 2023-01-29: qty 100, 30d supply, fill #0

## 2023-01-30 ENCOUNTER — Other Ambulatory Visit: Payer: Self-pay

## 2023-01-31 ENCOUNTER — Other Ambulatory Visit (HOSPITAL_BASED_OUTPATIENT_CLINIC_OR_DEPARTMENT_OTHER): Payer: Self-pay

## 2023-02-02 ENCOUNTER — Other Ambulatory Visit (HOSPITAL_BASED_OUTPATIENT_CLINIC_OR_DEPARTMENT_OTHER): Payer: Self-pay

## 2023-02-03 ENCOUNTER — Other Ambulatory Visit (HOSPITAL_BASED_OUTPATIENT_CLINIC_OR_DEPARTMENT_OTHER): Payer: Self-pay

## 2023-02-05 ENCOUNTER — Other Ambulatory Visit (HOSPITAL_BASED_OUTPATIENT_CLINIC_OR_DEPARTMENT_OTHER): Payer: Self-pay

## 2023-02-09 ENCOUNTER — Other Ambulatory Visit (HOSPITAL_BASED_OUTPATIENT_CLINIC_OR_DEPARTMENT_OTHER): Payer: Self-pay

## 2023-02-09 ENCOUNTER — Other Ambulatory Visit: Payer: Self-pay

## 2023-02-09 ENCOUNTER — Ambulatory Visit (HOSPITAL_BASED_OUTPATIENT_CLINIC_OR_DEPARTMENT_OTHER): Payer: Managed Care, Other (non HMO) | Admitting: Obstetrics & Gynecology

## 2023-02-09 MED ORDER — OLANZAPINE 2.5 MG PO TABS
2.5000 mg | ORAL_TABLET | Freq: Every evening | ORAL | 0 refills | Status: DC
  Filled 2023-02-09 – 2023-03-08 (×2): qty 7, 7d supply, fill #0

## 2023-02-09 MED ORDER — OLANZAPINE 2.5 MG PO TABS
2.5000 mg | ORAL_TABLET | Freq: Every day | ORAL | 1 refills | Status: AC
  Filled 2023-02-09: qty 30, 30d supply, fill #0
  Filled 2023-03-08: qty 30, 30d supply, fill #1
  Filled 2023-04-07: qty 30, 30d supply, fill #2
  Filled 2023-05-15 – 2023-05-18 (×2): qty 30, 30d supply, fill #3
  Filled 2023-06-20: qty 30, 30d supply, fill #4

## 2023-02-09 MED ORDER — COMIRNATY 30 MCG/0.3ML IM SUSY
PREFILLED_SYRINGE | INTRAMUSCULAR | 0 refills | Status: DC
  Filled 2023-02-09: qty 0.3, 1d supply, fill #0

## 2023-02-09 MED ORDER — DIVALPROEX SODIUM ER 500 MG PO TB24
500.0000 mg | ORAL_TABLET | Freq: Two times a day (BID) | ORAL | 1 refills | Status: DC
  Filled 2023-04-07 – 2023-05-13 (×5): qty 60, 30d supply, fill #0
  Filled 2023-06-20: qty 60, 30d supply, fill #1

## 2023-02-09 MED ORDER — DIVALPROEX SODIUM ER 500 MG PO TB24
500.0000 mg | ORAL_TABLET | Freq: Two times a day (BID) | ORAL | 0 refills | Status: DC
  Filled 2023-02-09: qty 14, 7d supply, fill #0

## 2023-02-09 MED ORDER — TRIHEXYPHENIDYL HCL 5 MG PO TABS
5.0000 mg | ORAL_TABLET | Freq: Every day | ORAL | 1 refills | Status: AC
  Filled 2023-02-09 – 2023-04-07 (×2): qty 30, 30d supply, fill #0
  Filled 2023-06-20: qty 30, 30d supply, fill #1

## 2023-02-09 MED ORDER — ALPRAZOLAM 0.5 MG PO TABS
0.2500 mg | ORAL_TABLET | Freq: Two times a day (BID) | ORAL | 2 refills | Status: AC | PRN
  Filled 2023-02-09 – 2023-04-07 (×2): qty 30, 30d supply, fill #0
  Filled 2023-07-31: qty 30, 30d supply, fill #1

## 2023-02-10 ENCOUNTER — Other Ambulatory Visit (HOSPITAL_BASED_OUTPATIENT_CLINIC_OR_DEPARTMENT_OTHER): Payer: Self-pay

## 2023-02-11 ENCOUNTER — Other Ambulatory Visit (HOSPITAL_BASED_OUTPATIENT_CLINIC_OR_DEPARTMENT_OTHER): Payer: Self-pay

## 2023-03-05 ENCOUNTER — Other Ambulatory Visit (HOSPITAL_BASED_OUTPATIENT_CLINIC_OR_DEPARTMENT_OTHER): Payer: Self-pay

## 2023-03-06 ENCOUNTER — Other Ambulatory Visit (HOSPITAL_BASED_OUTPATIENT_CLINIC_OR_DEPARTMENT_OTHER): Payer: Self-pay

## 2023-03-08 ENCOUNTER — Other Ambulatory Visit (HOSPITAL_BASED_OUTPATIENT_CLINIC_OR_DEPARTMENT_OTHER): Payer: Self-pay

## 2023-03-09 ENCOUNTER — Other Ambulatory Visit: Payer: Self-pay | Admitting: Hematology and Oncology

## 2023-03-09 ENCOUNTER — Ambulatory Visit
Admission: RE | Admit: 2023-03-09 | Discharge: 2023-03-09 | Disposition: A | Discharge status: Home / Self Care | Payer: Managed Care, Other (non HMO) | Source: Ambulatory Visit | Attending: Hematology and Oncology | Admitting: Hematology and Oncology

## 2023-03-09 ENCOUNTER — Other Ambulatory Visit (HOSPITAL_BASED_OUTPATIENT_CLINIC_OR_DEPARTMENT_OTHER): Payer: Self-pay

## 2023-03-09 DIAGNOSIS — C50211 Malignant neoplasm of upper-inner quadrant of right female breast: Secondary | ICD-10-CM

## 2023-03-12 ENCOUNTER — Other Ambulatory Visit: Payer: Self-pay

## 2023-03-13 ENCOUNTER — Telehealth: Payer: Self-pay | Admitting: *Deleted

## 2023-03-13 NOTE — Telephone Encounter (Signed)
This RN attempted to call pt to follow up on after hours call on 03/10/2023 at 431pm with pt stating desire to change MDs.  This RN left message stating request for return to discuss and accommodate her request appropriately.  This RN left her name for return call.

## 2023-03-16 ENCOUNTER — Other Ambulatory Visit (HOSPITAL_BASED_OUTPATIENT_CLINIC_OR_DEPARTMENT_OTHER): Payer: Self-pay

## 2023-03-17 ENCOUNTER — Other Ambulatory Visit (HOSPITAL_BASED_OUTPATIENT_CLINIC_OR_DEPARTMENT_OTHER): Payer: Self-pay

## 2023-03-17 MED ORDER — MOUNJARO 7.5 MG/0.5ML ~~LOC~~ SOAJ
7.5000 mg | SUBCUTANEOUS | 5 refills | Status: DC
  Filled 2023-03-17: qty 2, 28d supply, fill #0

## 2023-03-20 ENCOUNTER — Other Ambulatory Visit (HOSPITAL_BASED_OUTPATIENT_CLINIC_OR_DEPARTMENT_OTHER): Payer: Self-pay

## 2023-03-26 ENCOUNTER — Other Ambulatory Visit: Payer: Self-pay | Admitting: *Deleted

## 2023-03-26 ENCOUNTER — Other Ambulatory Visit (HOSPITAL_BASED_OUTPATIENT_CLINIC_OR_DEPARTMENT_OTHER): Payer: Self-pay

## 2023-03-26 DIAGNOSIS — Z170 Estrogen receptor positive status [ER+]: Secondary | ICD-10-CM

## 2023-03-26 MED ORDER — TAMOXIFEN CITRATE 20 MG PO TABS
20.0000 mg | ORAL_TABLET | Freq: Every day | ORAL | 4 refills | Status: DC
Start: 2023-03-26 — End: 2024-03-31
  Filled 2023-03-26: qty 30, 30d supply, fill #0
  Filled 2023-05-15 – 2023-05-18 (×2): qty 30, 30d supply, fill #1
  Filled 2023-06-20: qty 30, 30d supply, fill #2
  Filled 2023-07-31: qty 30, 30d supply, fill #3
  Filled 2023-09-04: qty 30, 30d supply, fill #4
  Filled 2023-10-02: qty 30, 30d supply, fill #5
  Filled 2023-11-26: qty 30, 30d supply, fill #6
  Filled 2023-12-24: qty 30, 30d supply, fill #7
  Filled 2024-01-11 – 2024-01-28 (×2): qty 30, 30d supply, fill #8
  Filled 2024-03-06: qty 30, 30d supply, fill #9
  Filled 2024-03-21: qty 30, 30d supply, fill #10

## 2023-03-27 ENCOUNTER — Other Ambulatory Visit (HOSPITAL_BASED_OUTPATIENT_CLINIC_OR_DEPARTMENT_OTHER): Payer: Self-pay

## 2023-03-27 MED ORDER — MOUNJARO 10 MG/0.5ML ~~LOC~~ SOAJ
SUBCUTANEOUS | 5 refills | Status: DC
  Filled 2023-03-27: qty 2, 28d supply, fill #0
  Filled 2023-04-14 – 2023-04-20 (×2): qty 2, 28d supply, fill #1
  Filled 2023-04-30 – 2023-05-07 (×2): qty 2, 28d supply, fill #2

## 2023-03-27 NOTE — Progress Notes (Signed)
Patient Care Team: [name], MD as PCP - General (Family Medicine) [name], MD as Consulting Physician (General Surgery) [name], MD as Consulting Physician (Radiation Oncology)  DIAGNOSIS:  Encounter Diagnosis  Name Primary?   Malignant neoplasm of upper-inner quadrant of right breast in female, estrogen receptor positive Yes    SUMMARY OF ONCOLOGIC HISTORY: Oncology History  Malignant neoplasm of upper-inner quadrant of right breast in female, estrogen receptor positive  03/01/2020 Oncotype testing   MammaPrint low risk.   03/05/2020 Initial Diagnosis   Malignant neoplasm of upper-inner quadrant of right breast in female, estrogen receptor positive (HCC)   03/25/2020 Genetic Testing   Negative genetic testing:  No pathogenic variants detected on the [facility] Breast Cancer STAT Panel or Common Hereditary Cancers Panel. The report date is 03/25/2020.  The Breast Cancer STAT Panel offered by [facility] includes sequencing and deletion/duplication analysis for the following 9 genes:  ATM, BRCA1, BRCA2, CDH1, CHEK2, PALB2, PTEN, STK11 and TP53.  The Common Hereditary Cancers Panel offered by [facility] includes sequencing and/or deletion duplication testing of the following 48 genes: APC, ATM, AXIN2, BARD1, BMPR1A, BRCA1, BRCA2, BRIP1, CDH1, CDK4, CDKN2A (p14ARF), CDKN2A (p16INK4a), CHEK2, CTNNA1, DICER1, EPCAM (Deletion/duplication testing only), GREM1 (promoter region deletion/duplication testing only), KIT, MEN1, MLH1, MSH2, MSH3, MSH6, MUTYH, NBN, NF1, NHTL1, PALB2, PDGFRA, PMS2, POLD1, POLE, PTEN, RAD50, RAD51C, RAD51D, RNF43, SDHB, SDHC, SDHD, SMAD4, SMARCA4. STK11, TP53, TSC1, TSC2, and VHL.  The following genes were evaluated for sequence changes only: SDHA and HOXB13 c.251G>A variant only.   04/04/2020 Cancer Staging   Staging form: Breast, AJCC 8th Edition - Pathologic stage from 04/04/2020: Stage IA (pT1c, pN0, cM0, G1, ER+, PR+, HER2-)   04/04/2020 Surgery   Right  lumpectomy Magnus Ivan) [phone]): IDC, grade 1, with intermediate grade DCIS. Negative margins. 4 axillary lymph nodes were negative.   05/02/2020 - 06/15/2020 Radiation Therapy   The patient initially received a dose of 50.4 Gy in 28 fractions to the breast using whole-breast tangent fields. This was delivered using a 3-D conformal technique. The patient then received a boost to the seroma. This delivered an additional 10 Gy in 5 fractions using a electron technique. The total dose was 60.4 Gy.   07/2020 -  Anti-estrogen oral therapy   Tamoxifen     CHIEF COMPLIANT: estrogen receptor positive breast cancer / Establish oncology care with Dr. Pamelia Hoit   INTERVAL HISTORY: [name] is a 47 y.o female with the above mentioned estrogen receptor positive. She presents to the clinic for a follow-up. She reports that she is taking the tamoxifen at night and it does work better. She does have mild hot flashes at least once a week where she may drench in sweats. She says her mood swings is manage by her psychology. She says she is at a risk for lymphedema. She says she was experiencing a tenderness and the finger tips felt some tingling. She denies any pain or discomfort in breast. She in the process of losing weight. She has lost over 30 pounds.    ALLERGIES:  is allergic to latex and copper-containing compounds.  MEDICATIONS:  Current Outpatient Medications  Medication Sig Dispense Refill   ALPRAZolam (XANAX) 0.25 MG tablet Take 1 tablet (0.25 mg) two times daily as needed for anxiety 30 tablet 0   ALPRAZolam (XANAX) 0.5 MG tablet Take 0.5 tablets (0.25 mg total) by mouth 2 (two) times daily as needed. 30 tablet 2   Continuous Blood Gluc Receiver (FREESTYLE LIBRE READER)  DEVI 1 Device by Does not apply route once for 1 dose. Dispense Freestyle Libre 3 1 each 0   Continuous Blood Gluc Sensor (FREESTYLE LIBRE 3 SENSOR) MISC 1 Units by Does not apply route every 14 (fourteen) days. 2 each 11    COVID-19 mRNA vaccine 2023-2024 (COMIRNATY) syringe Inject into the muscle. 0.3 mL 0   Crisaborole (EUCRISA) 2 % OINT Apply topically 2 (two) times daily. 100 g 1   cyclobenzaprine (FLEXERIL) 10 MG tablet 1 tablet nightly as needed for pelvic floor pain. 30 tablet 1   divalproex (DEPAKOTE ER) 500 MG 24 hr tablet Take 1 tablet (500 mg total) by mouth 2 (two) times daily. For mood stabilization 60 tablet 0   divalproex (DEPAKOTE ER) 500 MG 24 hr tablet Take 1 tablet (500 mg total) by mouth 2 (two) times daily. 180 tablet 1   fluconazole (DIFLUCAN) 150 MG tablet 1 tab po every 72 hours for 3 doses 3 tablet 0   fluocinonide cream (LIDEX) 0.05 % Apply topically 2 (two) times daily. 60 g 1   losartan (COZAAR) 25 MG tablet Take one tablet (25 mg dose) by mouth daily. 90 tablet 1   metFORMIN (GLUCOPHAGE-XR) 500 MG 24 hr tablet Take four tablets (2,000 mg dose) by mouth with breakfast. 360 tablet 1   OLANZapine (ZYPREXA) 2.5 MG tablet Take 1 tablet (2.5 mg total) by mouth at bedtime. 90 tablet 1   OLANZapine zydis (ZYPREXA) 5 MG disintegrating tablet Take 0.5 tablets (2.5 mg total) by mouth daily. For mood control     rizatriptan (MAXALT) 5 MG tablet Take 1 tablet (5 mg total) by mouth as needed for migraine. May repeat in 2 hours if needed 30 tablet 0   Semaglutide, 1 MG/DOSE, (OZEMPIC, 1 MG/DOSE,) 4 MG/3ML SOPN Inject 1 mg into the skin once a week. 3 mL 5   Semaglutide,0.25 or 0.5MG /DOS, (OZEMPIC, 0.25 OR 0.5 MG/DOSE,) 2 MG/3ML SOPN Inject 0.25 mg into the skin once a week. After 4 weeks, increase to 1.0 mg daily 3 mL 5   tamoxifen (NOLVADEX) 20 MG tablet Take 1 tablet (20 mg total) by mouth daily. 90 tablet 4   terconazole (TERAZOL 7) 0.4 % vaginal cream Place 1 applicator vaginally at bedtime for 7 nights; can also apply topically in AM for symptom relief. 45 g 0   tirzepatide (MOUNJARO) 10 MG/0.5ML Pen Inject 10 mg into the skin every 7 (seven) days. 2 mL 5   tirzepatide (MOUNJARO) 2.5 MG/0.5ML Pen  Inject 2.5 mg into the skin once a week. For 4 weeks 2 mL 0   tirzepatide (MOUNJARO) 5 MG/0.5ML Pen Inject 5 mg into the skin once a week. 6 mL 0   tirzepatide (MOUNJARO) 7.5 MG/0.5ML Pen Inject 7.5 mg into the skin once a week. 2 mL 5   triamcinolone cream (KENALOG) 0.1 % Apply topically 2 (two) times daily. * hold on file- patient will call when ready for refill 80 g 0   trihexyphenidyl (ARTANE) 5 MG tablet      trihexyphenidyl (ARTANE) 5 MG tablet Take 1 tablet (5 mg total) by mouth at bedtime. 90 tablet 1   No current facility-administered medications for this visit.    PHYSICAL EXAMINATION: ECOG PERFORMANCE STATUS: 1 - Symptomatic but completely ambulatory  Vitals:   03/30/23 0826  BP: (!) 149/87  Pulse: (!) 101  Resp: 18  Temp: 97.9 F (36.6 C)  SpO2: 100%   Filed Weights   03/30/23 0826  Weight: 209 lb  9.6 oz (95.1 kg)    BREAST: No palpable masses or nodules in either right or left breasts. No palpable axillary supraclavicular or infraclavicular adenopathy no breast tenderness or nipple discharge. (exam performed in the presence of a chaperone)  LABORATORY DATA:  I have reviewed the data as listed    Latest Ref Rng & Units 03/27/2022    8:49 AM 11/28/2021    9:22 AM 05/31/2021    9:18 AM  CMP  Glucose 70 - 99 mg/dL 161  096  045   BUN 6 - 20 mg/dL 13  12  9    Creatinine 0.44 - 1.00 mg/dL 4.09  8.11  9.14   Sodium 135 - 145 mmol/L 134  139  136   Potassium 3.5 - 5.1 mmol/L 4.1  3.8  4.0   Chloride 98 - 111 mmol/L 100  103  102   CO2 22 - 32 mmol/L 27  24  23    Calcium 8.9 - 10.3 mg/dL 9.2  8.7  8.9   Total Protein 6.5 - 8.1 g/dL 6.9  6.7  6.8   Total Bilirubin 0.3 - 1.2 mg/dL 0.3  0.3  0.3   Alkaline Phos 38 - 126 U/L 47  50  50   AST 15 - 41 U/L 13  12  20    ALT 0 - 44 U/L 14  13  19      Lab Results  Component Value Date   WBC 5.2 03/27/2022   HGB 14.7 03/27/2022   HCT 43.2 03/27/2022   MCV 83.2 03/27/2022   PLT 246 03/27/2022   NEUTROABS 2.3  03/27/2022    ASSESSMENT & PLAN:  Malignant neoplasm of upper-inner quadrant of right breast in female, estrogen receptor positive ([facility]CC) 03/01/2020: [place] N0 stage Ia IDC grade 1 ER/PR positive HER2 negative Ki-67 2%, MammaPrint: Low risk 03/25/2020: Genetics negative 04/04/2020: T1c N0 stage Ia grade 1 IDC with negative margins, 0/4 axillary lymph nodes negative 05/02/2020-06/15/2020: Adjuvant radiation  Current treatment: Tamoxifen started 07/15/2020 (plan of treatment: 10 years) Tamoxifen toxicities: Very intermittent hot flashes and night sweats approximately once a week.  She has been doing better since she started taking tamoxifen at bedtime.  Intermittent right arm sensitivity to touch: Could be related to any unusual activity or exertion.  I do not think that there is any current issues that we need to investigate for the right arm. Eczema on the dorsum of her right hand: Using topical steroid creams.  We discussed the role of supplements.  I recommended that she take vitamin D 2000 international units daily. Left knee arthritis: She is hoping that with weight loss she is going to start to do better.  She has lost 30 to 40 pounds since she started [facility].    Breast cancer surveillance: Breast exam 03/30/2023: Benign Mammogram 03/10/2023: Benign breast density category B  Return to clinic in 1 year for follow-up    No orders of the defined types were placed in this encounter.  The patient has a good understanding of the overall plan. she agrees with it. she will call with any problems that may develop before the next visit here. Total time spent: 30 mins including face to face time and time spent for planning, charting and co-ordination of care   Tamsen Meek, MD 03/30/23    I Janan Ridge am acting as a Neurosurgeon for [employer]  I have reviewed the above documentation for accuracy and completeness, and I agree with the above.

## 2023-03-30 ENCOUNTER — Inpatient Hospital Stay: Payer: Managed Care, Other (non HMO) | Attending: Hematology and Oncology | Admitting: Hematology and Oncology

## 2023-03-30 ENCOUNTER — Other Ambulatory Visit: Payer: Self-pay

## 2023-03-30 ENCOUNTER — Ambulatory Visit: Payer: Managed Care, Other (non HMO) | Admitting: Hematology and Oncology

## 2023-03-30 VITALS — BP 149/87 | HR 101 | Temp 97.9°F | Resp 18 | Ht 64.0 in | Wt 209.6 lb

## 2023-03-30 DIAGNOSIS — Z170 Estrogen receptor positive status [ER+]: Secondary | ICD-10-CM

## 2023-03-30 DIAGNOSIS — C50211 Malignant neoplasm of upper-inner quadrant of right female breast: Secondary | ICD-10-CM

## 2023-03-30 DIAGNOSIS — Z79810 Long term (current) use of selective estrogen receptor modulators (SERMs): Secondary | ICD-10-CM | POA: Insufficient documentation

## 2023-03-30 NOTE — Assessment & Plan Note (Signed)
03/01/2020: [place] N0 stage Ia IDC grade 1 ER/PR positive HER2 negative Ki-67 2%, MammaPrint: Low risk 03/25/2020: Genetics negative 04/04/2020: T1c N0 stage Ia grade 1 IDC with negative margins, 0/4 axillary lymph nodes negative 05/02/2020-06/15/2020: Adjuvant radiation  Current treatment: Tamoxifen started 07/15/2020 (plan of treatment: 10 years) Tamoxifen toxicities:  Breast cancer surveillance: Breast exam 03/30/2023: Benign Mammogram 03/10/2023: Benign breast density category B  Return to clinic in 1 year for follow-up

## 2023-04-07 ENCOUNTER — Other Ambulatory Visit (HOSPITAL_BASED_OUTPATIENT_CLINIC_OR_DEPARTMENT_OTHER): Payer: Self-pay

## 2023-04-07 ENCOUNTER — Other Ambulatory Visit: Payer: Self-pay

## 2023-04-14 ENCOUNTER — Other Ambulatory Visit: Payer: Self-pay

## 2023-04-14 ENCOUNTER — Other Ambulatory Visit (HOSPITAL_BASED_OUTPATIENT_CLINIC_OR_DEPARTMENT_OTHER): Payer: Self-pay

## 2023-04-15 ENCOUNTER — Other Ambulatory Visit: Payer: Self-pay

## 2023-04-27 ENCOUNTER — Other Ambulatory Visit (HOSPITAL_BASED_OUTPATIENT_CLINIC_OR_DEPARTMENT_OTHER): Payer: Self-pay

## 2023-04-27 MED ORDER — FLUOCINONIDE 0.05 % EX CREA
TOPICAL_CREAM | Freq: Two times a day (BID) | CUTANEOUS | 1 refills | Status: AC
  Filled 2023-04-27: qty 60, 30d supply, fill #0

## 2023-04-27 MED ORDER — MOUNJARO 12.5 MG/0.5ML ~~LOC~~ SOAJ
12.5000 mg | SUBCUTANEOUS | 5 refills | Status: AC
  Filled 2023-04-27: qty 2, 28d supply, fill #0
  Filled 2023-05-15: qty 2, 28d supply, fill #1
  Filled 2023-07-06: qty 2, 28d supply, fill #2
  Filled 2023-08-05: qty 2, 28d supply, fill #3
  Filled 2023-08-25: qty 2, 28d supply, fill #4
  Filled 2023-10-01: qty 2, 28d supply, fill #5

## 2023-04-27 MED ORDER — ONDANSETRON HCL 4 MG PO TABS
4.0000 mg | ORAL_TABLET | Freq: Every day | ORAL | 0 refills | Status: DC
  Filled 2023-04-27: qty 9, 30d supply, fill #0
  Filled 2023-05-15 – 2023-05-18 (×2): qty 9, 9d supply, fill #0
  Filled 2023-09-04: qty 9, 9d supply, fill #1
  Filled 2023-10-02: qty 9, 9d supply, fill #2
  Filled 2024-02-12: qty 3, 3d supply, fill #3

## 2023-04-27 MED ORDER — DICLOFENAC SODIUM 1 % EX GEL
1.0000 | Freq: Four times a day (QID) | CUTANEOUS | 0 refills | Status: DC | PRN
  Filled 2023-04-27: qty 300, 30d supply, fill #0
  Filled 2023-04-30 (×2): qty 300, 90d supply, fill #0

## 2023-04-30 ENCOUNTER — Other Ambulatory Visit (HOSPITAL_BASED_OUTPATIENT_CLINIC_OR_DEPARTMENT_OTHER): Payer: Self-pay

## 2023-05-01 ENCOUNTER — Other Ambulatory Visit (HOSPITAL_BASED_OUTPATIENT_CLINIC_OR_DEPARTMENT_OTHER): Payer: Self-pay

## 2023-05-04 ENCOUNTER — Other Ambulatory Visit (HOSPITAL_BASED_OUTPATIENT_CLINIC_OR_DEPARTMENT_OTHER): Payer: Self-pay

## 2023-05-07 ENCOUNTER — Other Ambulatory Visit (HOSPITAL_BASED_OUTPATIENT_CLINIC_OR_DEPARTMENT_OTHER): Payer: Self-pay

## 2023-05-13 ENCOUNTER — Other Ambulatory Visit (HOSPITAL_BASED_OUTPATIENT_CLINIC_OR_DEPARTMENT_OTHER): Payer: Self-pay

## 2023-05-15 ENCOUNTER — Other Ambulatory Visit: Payer: Self-pay

## 2023-05-15 ENCOUNTER — Other Ambulatory Visit (HOSPITAL_BASED_OUTPATIENT_CLINIC_OR_DEPARTMENT_OTHER): Payer: Self-pay

## 2023-05-15 MED ORDER — METFORMIN HCL ER 500 MG PO TB24
2000.0000 mg | ORAL_TABLET | Freq: Every day | ORAL | 1 refills | Status: DC
  Filled 2023-05-15: qty 120, 30d supply, fill #0
  Filled 2023-06-20: qty 120, 30d supply, fill #1
  Filled 2023-07-13: qty 120, 30d supply, fill #2
  Filled 2023-08-17: qty 120, 30d supply, fill #3
  Filled 2023-09-04: qty 120, 30d supply, fill #4
  Filled 2023-10-30: qty 120, 30d supply, fill #5

## 2023-05-18 ENCOUNTER — Other Ambulatory Visit (HOSPITAL_BASED_OUTPATIENT_CLINIC_OR_DEPARTMENT_OTHER): Payer: Self-pay

## 2023-05-19 ENCOUNTER — Other Ambulatory Visit (HOSPITAL_BASED_OUTPATIENT_CLINIC_OR_DEPARTMENT_OTHER): Payer: Self-pay

## 2023-05-24 ENCOUNTER — Other Ambulatory Visit (HOSPITAL_BASED_OUTPATIENT_CLINIC_OR_DEPARTMENT_OTHER): Payer: Self-pay

## 2023-05-27 ENCOUNTER — Other Ambulatory Visit (HOSPITAL_BASED_OUTPATIENT_CLINIC_OR_DEPARTMENT_OTHER): Payer: Self-pay

## 2023-06-04 ENCOUNTER — Other Ambulatory Visit (HOSPITAL_BASED_OUTPATIENT_CLINIC_OR_DEPARTMENT_OTHER): Payer: Self-pay

## 2023-06-13 ENCOUNTER — Other Ambulatory Visit (HOSPITAL_BASED_OUTPATIENT_CLINIC_OR_DEPARTMENT_OTHER): Payer: Self-pay

## 2023-06-21 ENCOUNTER — Other Ambulatory Visit (HOSPITAL_BASED_OUTPATIENT_CLINIC_OR_DEPARTMENT_OTHER): Payer: Self-pay

## 2023-06-22 ENCOUNTER — Other Ambulatory Visit: Payer: Self-pay

## 2023-06-22 ENCOUNTER — Other Ambulatory Visit (HOSPITAL_BASED_OUTPATIENT_CLINIC_OR_DEPARTMENT_OTHER): Payer: Self-pay

## 2023-06-23 ENCOUNTER — Other Ambulatory Visit (HOSPITAL_BASED_OUTPATIENT_CLINIC_OR_DEPARTMENT_OTHER): Payer: Self-pay

## 2023-07-03 ENCOUNTER — Ambulatory Visit (INDEPENDENT_AMBULATORY_CARE_PROVIDER_SITE_OTHER): Payer: 59 | Admitting: Obstetrics & Gynecology

## 2023-07-03 ENCOUNTER — Encounter (HOSPITAL_BASED_OUTPATIENT_CLINIC_OR_DEPARTMENT_OTHER): Payer: Self-pay | Admitting: Obstetrics & Gynecology

## 2023-07-03 ENCOUNTER — Other Ambulatory Visit (HOSPITAL_BASED_OUTPATIENT_CLINIC_OR_DEPARTMENT_OTHER): Payer: Self-pay

## 2023-07-03 VITALS — BP 138/95 | HR 103 | Ht 64.5 in | Wt 205.0 lb

## 2023-07-03 DIAGNOSIS — Z853 Personal history of malignant neoplasm of breast: Secondary | ICD-10-CM | POA: Diagnosis not present

## 2023-07-03 DIAGNOSIS — R232 Flushing: Secondary | ICD-10-CM | POA: Diagnosis not present

## 2023-07-03 DIAGNOSIS — R110 Nausea: Secondary | ICD-10-CM | POA: Diagnosis not present

## 2023-07-03 DIAGNOSIS — E1169 Type 2 diabetes mellitus with other specified complication: Secondary | ICD-10-CM

## 2023-07-03 DIAGNOSIS — Z01419 Encounter for gynecological examination (general) (routine) without abnormal findings: Secondary | ICD-10-CM

## 2023-07-03 DIAGNOSIS — C50211 Malignant neoplasm of upper-inner quadrant of right female breast: Secondary | ICD-10-CM

## 2023-07-03 DIAGNOSIS — E669 Obesity, unspecified: Secondary | ICD-10-CM

## 2023-07-03 DIAGNOSIS — Z170 Estrogen receptor positive status [ER+]: Secondary | ICD-10-CM

## 2023-07-03 MED ORDER — VEOZAH 45 MG PO TABS
45.0000 mg | ORAL_TABLET | Freq: Every day | ORAL | Status: DC
Start: 2023-07-03 — End: 2023-08-05

## 2023-07-03 MED ORDER — PROMETHAZINE HCL 12.5 MG PO TABS
12.5000 mg | ORAL_TABLET | Freq: Four times a day (QID) | ORAL | 1 refills | Status: DC | PRN
Start: 2023-07-03 — End: 2024-03-07
  Filled 2023-07-03: qty 30, 8d supply, fill #0
  Filled 2023-10-02: qty 30, 8d supply, fill #1

## 2023-07-03 NOTE — Patient Instructions (Addendum)
[facility] [address] [place] [address] Directions [phone]  [employer] -- [facility]  August 12th

## 2023-07-03 NOTE — Progress Notes (Unsigned)
47 y.o. G0P0000 Married White or Caucasian female here for annual exam.  Has started on Munjaro and metformin.  HbA1c is much better--was 12.5 and now 7.4.  Having some side effects including nausea, vomiting and has used zofran.  Having a lot of constipation.  Discussed phenergan.  This worked for her but she felt sleepy with it.  Used the 25mg  dosage.  We discussed trial of lower dosage.    Cycles are still on time but very light.  She is having hot flashes as well.  She is on tamoxifen.  She is being followed by Dr. Pamelia Hoit now.  Treatments options discussed.  Pt is not happy with appearance of right breast after lumpectomy and radiation.  Wonders about options.  Suggested consult for possible repair.  Patient's last menstrual period was 06/27/2023 (exact date).          Sexually active: No.  The current method of family planning is abstinence.    Smoker:  no  Health Maintenance: Pap:  01/2022 MMG:  03/10/2023 Colonoscopy:  she has called for scheduling Screening Labs: does 04/2023   reports that she has quit smoking. She has never used smokeless tobacco. She reports current alcohol use. She reports that she does not use drugs.  Past Medical History:  Diagnosis Date   Bipolar 1 disorder (HCC)    Complication of anesthesia    Diabetes mellitus type 2 in obese    Eczema    Family history of prostate cancer    Obesity    PONV (postoperative nausea and vomiting)    Seasonal allergies     Past Surgical History:  Procedure Laterality Date   BREAST LUMPECTOMY Right 04/04/2020   BREAST LUMPECTOMY WITH RADIOACTIVE SEED AND SENTINEL LYMPH NODE BIOPSY Right 04/04/2020   Procedure: RIGHT BREAST LUMPECTOMY WITH RADIOACTIVE SEED AND SENTINEL LYMPH NODE BIOPSY;  Surgeon: [name], MD;  Location: [facility];  Service: General;  Laterality: Right;   SALPINGECTOMY  12/16/2007   right partial, was a paratubal cyst    Current Outpatient Medications  Medication Sig Dispense  Refill   ALPRAZolam (XANAX) 0.5 MG tablet Take 0.5 tablets (0.25 mg total) by mouth 2 (two) times daily as needed. 30 tablet 2   Crisaborole (EUCRISA) 2 % OINT Apply topically 2 (two) times daily. 100 g 1   divalproex (DEPAKOTE ER) 500 MG 24 hr tablet Take 1 tablet (500 mg total) by mouth 2 (two) times daily. 180 tablet 1   fluocinonide cream (LIDEX) 0.05 % Apply topically 2 (two) times daily. 60 g 1   fluocinonide cream (LIDEX) 0.05 % Apply topically 2 (two) times daily. 60 g 1   losartan (COZAAR) 25 MG tablet Take one tablet (25 mg dose) by mouth daily. 90 tablet 1   metFORMIN (GLUCOPHAGE-XR) 500 MG 24 hr tablet Take 4 tablets (2,000 mg total) by mouth daily with breakfast. 360 tablet 1   OLANZapine (ZYPREXA) 2.5 MG tablet Take 1 tablet (2.5 mg total) by mouth at bedtime. 90 tablet 1   ondansetron (ZOFRAN) 4 MG tablet Take one tablet (4 mg dose) by mouth daily as needed for Nausea. 30 tablet 0   tamoxifen (NOLVADEX) 20 MG tablet Take 1 tablet (20 mg total) by mouth daily. 90 tablet 4   tirzepatide (MOUNJARO) 12.5 MG/0.5ML Pen Inject 12.5 mg into the skin once a week. 2 mL 5   trihexyphenidyl (ARTANE) 5 MG tablet Take 1 tablet (5 mg total) by mouth at bedtime. 90 tablet 1  Continuous Blood Gluc Receiver (FREESTYLE LIBRE READER) DEVI 1 Device by Does not apply route once for 1 dose. Dispense Freestyle Libre 3 (Patient not taking: Reported on 07/03/2023) 1 each 0   Continuous Blood Gluc Sensor (FREESTYLE LIBRE 3 SENSOR) MISC 1 Units by Does not apply route every 14 (fourteen) days. (Patient not taking: Reported on 07/03/2023) 2 each 11   cyclobenzaprine (FLEXERIL) 10 MG tablet 1 tablet nightly as needed for pelvic floor pain. (Patient not taking: Reported on 07/03/2023) 30 tablet 1   rizatriptan (MAXALT) 5 MG tablet Take 1 tablet (5 mg total) by mouth as needed for migraine. May repeat in 2 hours if needed (Patient not taking: Reported on 07/03/2023) 30 tablet 0   tirzepatide (MOUNJARO) 10 MG/0.5ML Pen  Inject 10 mg into the skin every 7 (seven) days. (Patient not taking: Reported on 07/03/2023) 2 mL 5   tirzepatide (MOUNJARO) 2.5 MG/0.5ML Pen Inject 2.5 mg into the skin once a week. For 4 weeks (Patient not taking: Reported on 07/03/2023) 2 mL 0   tirzepatide (MOUNJARO) 5 MG/0.5ML Pen Inject 5 mg into the skin once a week. (Patient not taking: Reported on 07/03/2023) 6 mL 0   triamcinolone cream (KENALOG) 0.1 % Apply topically 2 (two) times daily. * hold on file- patient will call when ready for refill (Patient not taking: Reported on 07/03/2023) 80 g 0   No current facility-administered medications for this visit.    Family History  Problem Relation Age of Onset   Immunodeficiency Maternal Aunt    Diabetes Maternal Aunt    Breast cancer Paternal Aunt    Prostate cancer Paternal Uncle 35   Dementia Maternal Grandmother    Heart disease Other    Macular degeneration Other    Mental illness Other        both sides of family    ROS: Constitutional: negative Genitourinary:negative  Exam:   BP (!) 138/95   Pulse (!) 103   Ht 5' 4.5" (1.638 m)   Wt 205 lb (93 kg)   LMP 06/27/2023 (Exact Date)   BMI 34.64 kg/m   Height: 5' 4.5" (163.8 cm)  General appearance: alert, cooperative and appears stated age Head: Normocephalic, without obvious abnormality, atraumatic Neck: no adenopathy, supple, symmetrical, trachea midline and thyroid normal to inspection and palpation Lungs: clear to auscultation bilaterally Breasts: normal appearance, no masses or tenderness, scarring right breast stable Heart: regular rate and rhythm Abdomen: soft, non-tender; bowel sounds normal; no masses,  no organomegaly Extremities: extremities normal, atraumatic, no cyanosis or edema Skin: Skin color, texture, turgor normal. No rashes or lesions Lymph nodes: Cervical, supraclavicular, and axillary nodes normal. No abnormal inguinal nodes palpated Neurologic: Grossly normal   Pelvic: External genitalia:  no  lesions              Urethra:  normal appearing urethra with no masses, tenderness or lesions              Bartholins and Skenes: normal                 Vagina: normal appearing vagina with normal color and no discharge, no lesions              Cervix: no lesions              Pap taken: No. Bimanual Exam:  Uterus:  normal size, contour, position, consistency, mobility, non-tender              Adnexa: normal adnexa  Rectovaginal: Confirms               Anus:  normal sphincter tone, no lesions  Chaperone, Raechel Ache, RN, was present for exam.  Assessment/Plan: 1. Well woman exam with routine gynecological exam - Pap smear neg 01/2022.  Not indicated today. - Mammogram 03/10/2023 - Colonoscopy is due.  She has called for appt. - lab work done with Dr. Earnest Bailey - vaccines reviewed/updated  2. Nausea - will try phenergan.  If works, she can have Dr. Earnest Bailey write this as she is helping manage weight loss medicatoins - promethazine (PHENERGAN) 12.5 MG tablet; Take 1 tablet (12.5 mg total) by mouth every 6 (six) hours as needed for nausea or vomiting.  Dispense: 30 tablet; Refill: 1  3. Hot flashes - will try veozah.  Had normal liver enzymes obtained 03/28/2023 and reviewed in Care Everywhere.  Pt understands will need tested every 3 months and can do with Dr. Earnest Bailey with regular lab work.   - Fezolinetant (VEOZAH) 45 MG TABS; Take 1 tablet (45 mg total) by mouth daily in the afternoon.  Sample given for 1 month.  She will give update.  4. History of breast cancer - desires consult to see if any additional surgical procedures will help appearance - Ambulatory referral to Plastic Surgery  5. Type 2 diabetes mellitus with obesity (HCC) - much improved hba1c  6. Malignant neoplasm of upper-inner quadrant of right breast in female, estrogen receptor positive (HCC) - followed by Dr. Pamelia Hoit

## 2023-07-06 ENCOUNTER — Encounter (HOSPITAL_BASED_OUTPATIENT_CLINIC_OR_DEPARTMENT_OTHER): Payer: Self-pay

## 2023-07-06 ENCOUNTER — Other Ambulatory Visit (HOSPITAL_BASED_OUTPATIENT_CLINIC_OR_DEPARTMENT_OTHER): Payer: Self-pay

## 2023-07-11 ENCOUNTER — Other Ambulatory Visit (HOSPITAL_BASED_OUTPATIENT_CLINIC_OR_DEPARTMENT_OTHER): Payer: Self-pay

## 2023-07-13 ENCOUNTER — Other Ambulatory Visit (HOSPITAL_BASED_OUTPATIENT_CLINIC_OR_DEPARTMENT_OTHER): Payer: Self-pay

## 2023-07-28 ENCOUNTER — Other Ambulatory Visit (HOSPITAL_BASED_OUTPATIENT_CLINIC_OR_DEPARTMENT_OTHER): Payer: Self-pay

## 2023-07-28 MED ORDER — TRIHEXYPHENIDYL HCL 5 MG PO TABS
5.0000 mg | ORAL_TABLET | Freq: Every day | ORAL | 2 refills | Status: DC
  Filled 2023-07-28 – 2023-07-31 (×2): qty 30, 30d supply, fill #0
  Filled 2023-09-04: qty 30, 30d supply, fill #1
  Filled 2023-10-02: qty 30, 30d supply, fill #2

## 2023-07-28 MED ORDER — DIVALPROEX SODIUM ER 500 MG PO TB24
500.0000 mg | ORAL_TABLET | Freq: Two times a day (BID) | ORAL | 2 refills | Status: DC
  Filled 2023-07-28 – 2023-07-31 (×2): qty 60, 30d supply, fill #0
  Filled 2023-08-17: qty 60, 30d supply, fill #1
  Filled 2023-09-04: qty 60, 30d supply, fill #2

## 2023-07-28 MED ORDER — OLANZAPINE 2.5 MG PO TABS
2.5000 mg | ORAL_TABLET | Freq: Every evening | ORAL | 2 refills | Status: DC
  Filled 2023-07-28 – 2023-07-31 (×2): qty 30, 30d supply, fill #0
  Filled 2023-09-04: qty 30, 30d supply, fill #1
  Filled 2023-10-02: qty 30, 30d supply, fill #2

## 2023-07-28 MED ORDER — PREDNISONE 20 MG PO TABS
20.0000 mg | ORAL_TABLET | Freq: Every day | ORAL | 0 refills | Status: DC
  Filled 2023-07-28: qty 5, 5d supply, fill #0

## 2023-07-31 ENCOUNTER — Other Ambulatory Visit (HOSPITAL_BASED_OUTPATIENT_CLINIC_OR_DEPARTMENT_OTHER): Payer: Self-pay

## 2023-07-31 ENCOUNTER — Other Ambulatory Visit: Payer: Self-pay

## 2023-08-01 ENCOUNTER — Other Ambulatory Visit (HOSPITAL_BASED_OUTPATIENT_CLINIC_OR_DEPARTMENT_OTHER): Payer: Self-pay

## 2023-08-01 ENCOUNTER — Other Ambulatory Visit: Payer: Self-pay

## 2023-08-03 ENCOUNTER — Other Ambulatory Visit (HOSPITAL_BASED_OUTPATIENT_CLINIC_OR_DEPARTMENT_OTHER): Payer: Self-pay

## 2023-08-03 ENCOUNTER — Other Ambulatory Visit (HOSPITAL_BASED_OUTPATIENT_CLINIC_OR_DEPARTMENT_OTHER): Payer: 59

## 2023-08-03 DIAGNOSIS — Z79899 Other long term (current) drug therapy: Secondary | ICD-10-CM

## 2023-08-03 LAB — COMPREHENSIVE METABOLIC PANEL
ALT: 12 IU/L (ref 0–32)
AST: 12 IU/L (ref 0–40)
Albumin: 4.1 g/dL (ref 3.9–4.9)
Alkaline Phosphatase: 44 IU/L (ref 44–121)
BUN/Creatinine Ratio: 11 (ref 9–23)
BUN: 6 mg/dL (ref 6–24)
Bilirubin Total: 0.2 mg/dL (ref 0.0–1.2)
CO2: 24 mmol/L (ref 20–29)
Calcium: 9.7 mg/dL (ref 8.7–10.2)
Chloride: 101 mmol/L (ref 96–106)
Creatinine, Ser: 0.57 mg/dL (ref 0.57–1.00)
Globulin, Total: 2.3 g/dL (ref 1.5–4.5)
Glucose: 137 mg/dL — ABNORMAL HIGH (ref 70–99)
Potassium: 4.3 mmol/L (ref 3.5–5.2)
Sodium: 140 mmol/L (ref 134–144)
Total Protein: 6.4 g/dL (ref 6.0–8.5)
eGFR: 114 mL/min/{1.73_m2} (ref >59)

## 2023-08-05 ENCOUNTER — Encounter (HOSPITAL_BASED_OUTPATIENT_CLINIC_OR_DEPARTMENT_OTHER): Payer: Self-pay | Admitting: Obstetrics & Gynecology

## 2023-08-05 ENCOUNTER — Other Ambulatory Visit (HOSPITAL_BASED_OUTPATIENT_CLINIC_OR_DEPARTMENT_OTHER): Payer: Self-pay

## 2023-08-05 ENCOUNTER — Other Ambulatory Visit (HOSPITAL_BASED_OUTPATIENT_CLINIC_OR_DEPARTMENT_OTHER): Payer: Self-pay | Admitting: *Deleted

## 2023-08-05 DIAGNOSIS — R232 Flushing: Secondary | ICD-10-CM

## 2023-08-05 DIAGNOSIS — Z79899 Other long term (current) drug therapy: Secondary | ICD-10-CM

## 2023-08-05 MED ORDER — VEOZAH 45 MG PO TABS
45.0000 mg | ORAL_TABLET | Freq: Every day | ORAL | 2 refills | Status: DC
Start: 2023-08-05 — End: 2023-11-23
  Filled 2023-08-05 – 2023-08-06 (×2): qty 30, 30d supply, fill #0
  Filled 2023-09-04: qty 30, 30d supply, fill #1
  Filled 2023-10-02: qty 30, 30d supply, fill #2

## 2023-08-05 NOTE — Addendum Note (Signed)
Addended by: Jerene Bears on: 08/05/2023 06:11 AM   Modules accepted: Orders

## 2023-08-05 NOTE — Progress Notes (Signed)
Order from result note of CMP.  Veozah per Dr Arnetha Gula CMA

## 2023-08-06 ENCOUNTER — Other Ambulatory Visit (HOSPITAL_BASED_OUTPATIENT_CLINIC_OR_DEPARTMENT_OTHER): Payer: Self-pay

## 2023-08-06 ENCOUNTER — Other Ambulatory Visit (HOSPITAL_BASED_OUTPATIENT_CLINIC_OR_DEPARTMENT_OTHER): Payer: Self-pay | Admitting: Obstetrics & Gynecology

## 2023-08-10 ENCOUNTER — Other Ambulatory Visit (HOSPITAL_BASED_OUTPATIENT_CLINIC_OR_DEPARTMENT_OTHER): Payer: Self-pay

## 2023-08-10 MED ORDER — LOSARTAN POTASSIUM 25 MG PO TABS
25.0000 mg | ORAL_TABLET | Freq: Every day | ORAL | 1 refills | Status: AC
  Filled 2023-08-10: qty 30, 30d supply, fill #0
  Filled 2023-09-04: qty 30, 30d supply, fill #1
  Filled 2023-10-02: qty 30, 30d supply, fill #2
  Filled 2023-11-23: qty 30, 30d supply, fill #3
  Filled 2023-12-21: qty 30, 30d supply, fill #4
  Filled 2024-01-11 – 2024-01-28 (×2): qty 30, 30d supply, fill #5

## 2023-08-17 ENCOUNTER — Other Ambulatory Visit (HOSPITAL_BASED_OUTPATIENT_CLINIC_OR_DEPARTMENT_OTHER): Payer: Self-pay

## 2023-08-25 ENCOUNTER — Other Ambulatory Visit (HOSPITAL_BASED_OUTPATIENT_CLINIC_OR_DEPARTMENT_OTHER): Payer: Self-pay

## 2023-09-04 ENCOUNTER — Other Ambulatory Visit: Payer: Self-pay

## 2023-09-04 ENCOUNTER — Other Ambulatory Visit (HOSPITAL_BASED_OUTPATIENT_CLINIC_OR_DEPARTMENT_OTHER): Payer: Self-pay

## 2023-09-28 ENCOUNTER — Other Ambulatory Visit (HOSPITAL_BASED_OUTPATIENT_CLINIC_OR_DEPARTMENT_OTHER): Payer: Self-pay

## 2023-09-28 MED ORDER — NA SULFATE-K SULFATE-MG SULF 17.5-3.13-1.6 GM/177ML PO SOLN
177.0000 mL | Freq: Two times a day (BID) | ORAL | 0 refills | Status: DC
  Filled 2023-09-28: qty 354, 1d supply, fill #0

## 2023-10-01 ENCOUNTER — Other Ambulatory Visit (HOSPITAL_BASED_OUTPATIENT_CLINIC_OR_DEPARTMENT_OTHER): Payer: Self-pay

## 2023-10-02 ENCOUNTER — Other Ambulatory Visit: Payer: Self-pay

## 2023-10-02 ENCOUNTER — Other Ambulatory Visit (HOSPITAL_BASED_OUTPATIENT_CLINIC_OR_DEPARTMENT_OTHER): Payer: Self-pay

## 2023-10-28 ENCOUNTER — Other Ambulatory Visit (HOSPITAL_BASED_OUTPATIENT_CLINIC_OR_DEPARTMENT_OTHER): Payer: Self-pay

## 2023-10-28 ENCOUNTER — Other Ambulatory Visit: Payer: Self-pay

## 2023-10-28 MED ORDER — OLANZAPINE 2.5 MG PO TABS
2.5000 mg | ORAL_TABLET | Freq: Every day | ORAL | 3 refills | Status: DC
  Filled 2023-10-28: qty 30, 30d supply, fill #0
  Filled 2023-11-23: qty 30, 30d supply, fill #1
  Filled 2024-03-21: qty 30, 30d supply, fill #2

## 2023-10-28 MED ORDER — MOUNJARO 15 MG/0.5ML ~~LOC~~ SOAJ
15.0000 mg | SUBCUTANEOUS | 5 refills | Status: DC
  Filled 2023-10-28: qty 2, 28d supply, fill #0
  Filled 2023-11-23: qty 2, 28d supply, fill #1
  Filled 2023-12-21: qty 2, 28d supply, fill #2
  Filled 2024-01-11: qty 2, 28d supply, fill #3
  Filled 2024-02-22 – 2024-02-23 (×2): qty 2, 28d supply, fill #4
  Filled 2024-03-21: qty 2, 28d supply, fill #5

## 2023-10-28 MED ORDER — ONDANSETRON HCL 4 MG PO TABS
4.0000 mg | ORAL_TABLET | Freq: Every day | ORAL | 0 refills | Status: AC | PRN
Start: 2023-10-28 — End: ?
  Filled 2023-10-28: qty 9, 9d supply, fill #0
  Filled 2023-11-23: qty 9, 9d supply, fill #1
  Filled 2024-01-11: qty 9, 9d supply, fill #2
  Filled 2024-03-06: qty 9, 9d supply, fill #3

## 2023-10-28 MED ORDER — METHYLPHENIDATE HCL ER (OSM) 18 MG PO TBCR
18.0000 mg | EXTENDED_RELEASE_TABLET | Freq: Every morning | ORAL | 0 refills | Status: DC
  Filled 2023-10-28: qty 30, 30d supply, fill #0

## 2023-10-28 MED ORDER — TRIHEXYPHENIDYL HCL 5 MG PO TABS
5.0000 mg | ORAL_TABLET | Freq: Every evening | ORAL | 3 refills | Status: DC
  Filled 2023-10-28: qty 30, 30d supply, fill #0
  Filled 2023-11-23: qty 30, 30d supply, fill #1
  Filled 2024-03-21: qty 30, 30d supply, fill #2

## 2023-10-28 MED ORDER — DIVALPROEX SODIUM ER 500 MG PO TB24
500.0000 mg | ORAL_TABLET | Freq: Two times a day (BID) | ORAL | 3 refills | Status: DC
Start: 2023-10-28 — End: 2023-12-24
  Filled 2023-10-28: qty 30, 15d supply, fill #0
  Filled 2023-11-10: qty 60, 30d supply, fill #1
  Filled 2023-11-23: qty 30, 15d supply, fill #1
  Filled 2023-11-26: qty 60, 30d supply, fill #1
  Filled 2023-12-21: qty 60, 30d supply, fill #2

## 2023-10-28 MED ORDER — NYSTATIN 100000 UNIT/GM EX OINT
1.0000 | TOPICAL_OINTMENT | Freq: Three times a day (TID) | CUTANEOUS | 0 refills | Status: AC
Start: 2023-10-28 — End: ?
  Filled 2023-10-28: qty 30, 10d supply, fill #0

## 2023-10-30 ENCOUNTER — Other Ambulatory Visit (HOSPITAL_BASED_OUTPATIENT_CLINIC_OR_DEPARTMENT_OTHER): Payer: Self-pay

## 2023-11-06 ENCOUNTER — Other Ambulatory Visit (HOSPITAL_BASED_OUTPATIENT_CLINIC_OR_DEPARTMENT_OTHER): Payer: Self-pay

## 2023-11-10 ENCOUNTER — Other Ambulatory Visit (HOSPITAL_BASED_OUTPATIENT_CLINIC_OR_DEPARTMENT_OTHER): Payer: Self-pay

## 2023-11-20 ENCOUNTER — Other Ambulatory Visit (HOSPITAL_BASED_OUTPATIENT_CLINIC_OR_DEPARTMENT_OTHER): Payer: Self-pay

## 2023-11-23 ENCOUNTER — Other Ambulatory Visit (HOSPITAL_BASED_OUTPATIENT_CLINIC_OR_DEPARTMENT_OTHER): Payer: Self-pay

## 2023-11-23 ENCOUNTER — Other Ambulatory Visit: Payer: Self-pay

## 2023-11-23 ENCOUNTER — Other Ambulatory Visit (HOSPITAL_BASED_OUTPATIENT_CLINIC_OR_DEPARTMENT_OTHER): Payer: Self-pay | Admitting: Obstetrics & Gynecology

## 2023-11-23 DIAGNOSIS — R232 Flushing: Secondary | ICD-10-CM

## 2023-11-23 MED ORDER — VEOZAH 45 MG PO TABS
45.0000 mg | ORAL_TABLET | Freq: Every day | ORAL | 2 refills | Status: DC
Start: 2023-11-23 — End: 2024-01-12
  Filled 2023-11-23: qty 30, 30d supply, fill #0

## 2023-11-25 ENCOUNTER — Other Ambulatory Visit: Payer: Self-pay

## 2023-11-25 ENCOUNTER — Other Ambulatory Visit (HOSPITAL_BASED_OUTPATIENT_CLINIC_OR_DEPARTMENT_OTHER): Payer: Self-pay

## 2023-11-25 MED ORDER — METFORMIN HCL ER 500 MG PO TB24
2000.0000 mg | ORAL_TABLET | Freq: Every day | ORAL | 3 refills | Status: DC
  Filled 2023-11-25: qty 120, 30d supply, fill #0
  Filled 2023-12-21: qty 120, 30d supply, fill #1
  Filled 2024-01-28: qty 120, 30d supply, fill #2
  Filled 2024-03-06: qty 120, 30d supply, fill #3
  Filled 2024-03-21 – 2024-04-02 (×3): qty 120, 30d supply, fill #4
  Filled 2024-05-02: qty 120, 30d supply, fill #5
  Filled 2024-06-02: qty 120, 30d supply, fill #6
  Filled 2024-07-04: qty 120, 30d supply, fill #7
  Filled 2024-08-03: qty 120, 30d supply, fill #8
  Filled 2024-09-15: qty 120, 30d supply, fill #9
  Filled 2024-10-17: qty 120, 30d supply, fill #10
  Filled 2024-11-07 – 2024-11-20 (×2): qty 120, 30d supply, fill #11

## 2023-11-26 ENCOUNTER — Other Ambulatory Visit (HOSPITAL_BASED_OUTPATIENT_CLINIC_OR_DEPARTMENT_OTHER): Payer: Self-pay

## 2023-11-26 ENCOUNTER — Encounter (HOSPITAL_BASED_OUTPATIENT_CLINIC_OR_DEPARTMENT_OTHER): Payer: Self-pay | Admitting: Obstetrics & Gynecology

## 2023-11-26 MED ORDER — FLULAVAL 0.5 ML IM SUSY
0.5000 mL | PREFILLED_SYRINGE | Freq: Once | INTRAMUSCULAR | 0 refills | Status: AC
  Filled 2023-11-26: qty 0.5, 1d supply, fill #0

## 2023-11-27 ENCOUNTER — Other Ambulatory Visit (HOSPITAL_BASED_OUTPATIENT_CLINIC_OR_DEPARTMENT_OTHER): Payer: Self-pay

## 2023-11-27 MED ORDER — METHYLPHENIDATE HCL ER (OSM) 18 MG PO TBCR
18.0000 mg | EXTENDED_RELEASE_TABLET | Freq: Every morning | ORAL | 0 refills | Status: DC
  Filled 2023-11-27: qty 30, 30d supply, fill #0

## 2023-12-02 ENCOUNTER — Other Ambulatory Visit (HOSPITAL_BASED_OUTPATIENT_CLINIC_OR_DEPARTMENT_OTHER): Payer: Self-pay

## 2023-12-14 ENCOUNTER — Other Ambulatory Visit (HOSPITAL_BASED_OUTPATIENT_CLINIC_OR_DEPARTMENT_OTHER): Payer: Self-pay | Admitting: Obstetrics & Gynecology

## 2023-12-14 ENCOUNTER — Other Ambulatory Visit (HOSPITAL_BASED_OUTPATIENT_CLINIC_OR_DEPARTMENT_OTHER): Payer: Self-pay

## 2023-12-14 MED ORDER — GABAPENTIN 100 MG PO CAPS
ORAL_CAPSULE | ORAL | 0 refills | Status: DC
Start: 2023-12-14 — End: 2024-01-11
  Filled 2023-12-14: qty 60, 30d supply, fill #0

## 2023-12-15 ENCOUNTER — Ambulatory Visit: Payer: 59 | Admitting: Dermatology

## 2023-12-21 ENCOUNTER — Other Ambulatory Visit (HOSPITAL_BASED_OUTPATIENT_CLINIC_OR_DEPARTMENT_OTHER): Payer: Self-pay

## 2023-12-21 ENCOUNTER — Other Ambulatory Visit: Payer: Self-pay

## 2023-12-24 ENCOUNTER — Other Ambulatory Visit: Payer: Self-pay

## 2023-12-24 ENCOUNTER — Other Ambulatory Visit (HOSPITAL_BASED_OUTPATIENT_CLINIC_OR_DEPARTMENT_OTHER): Payer: Self-pay

## 2023-12-24 MED ORDER — METHYLPHENIDATE HCL ER (OSM) 18 MG PO TBCR
18.0000 mg | EXTENDED_RELEASE_TABLET | Freq: Every morning | ORAL | 0 refills | Status: DC
  Filled 2024-01-11: qty 30, 30d supply, fill #0

## 2023-12-24 MED ORDER — OLANZAPINE 2.5 MG PO TABS
2.5000 mg | ORAL_TABLET | Freq: Every day | ORAL | 3 refills | Status: DC
  Filled 2023-12-24: qty 30, 30d supply, fill #0
  Filled 2024-02-22: qty 30, 30d supply, fill #1

## 2023-12-24 MED ORDER — DIVALPROEX SODIUM ER 500 MG PO TB24
500.0000 mg | ORAL_TABLET | Freq: Two times a day (BID) | ORAL | 3 refills | Status: DC
  Filled 2023-12-24: qty 30, 15d supply, fill #0

## 2023-12-24 MED ORDER — DIVALPROEX SODIUM ER 500 MG PO TB24
500.0000 mg | ORAL_TABLET | Freq: Two times a day (BID) | ORAL | 3 refills | Status: DC
  Filled 2023-12-24: qty 60, 30d supply, fill #0
  Filled 2024-02-12: qty 60, 30d supply, fill #1

## 2023-12-24 MED ORDER — TRIHEXYPHENIDYL HCL 5 MG PO TABS
5.0000 mg | ORAL_TABLET | Freq: Every day | ORAL | 3 refills | Status: DC
  Filled 2023-12-24: qty 30, 30d supply, fill #0
  Filled 2024-02-22: qty 30, 30d supply, fill #1

## 2023-12-24 MED ORDER — METHYLPHENIDATE HCL ER (OSM) 18 MG PO TBCR
18.0000 mg | EXTENDED_RELEASE_TABLET | Freq: Every morning | ORAL | 0 refills | Status: DC
  Filled 2024-03-21 – 2024-03-31 (×4): qty 30, 30d supply, fill #0

## 2023-12-24 MED ORDER — METHYLPHENIDATE HCL ER (OSM) 18 MG PO TBCR
18.0000 mg | EXTENDED_RELEASE_TABLET | Freq: Every morning | ORAL | 0 refills | Status: AC
  Filled 2024-02-22: qty 30, 30d supply, fill #0

## 2024-01-11 ENCOUNTER — Other Ambulatory Visit: Payer: Self-pay

## 2024-01-11 ENCOUNTER — Other Ambulatory Visit (HOSPITAL_BASED_OUTPATIENT_CLINIC_OR_DEPARTMENT_OTHER): Payer: Self-pay

## 2024-01-11 ENCOUNTER — Other Ambulatory Visit (HOSPITAL_BASED_OUTPATIENT_CLINIC_OR_DEPARTMENT_OTHER): Payer: Self-pay | Admitting: Obstetrics & Gynecology

## 2024-01-12 ENCOUNTER — Other Ambulatory Visit (HOSPITAL_BASED_OUTPATIENT_CLINIC_OR_DEPARTMENT_OTHER): Payer: Self-pay | Admitting: Certified Nurse Midwife

## 2024-01-12 ENCOUNTER — Other Ambulatory Visit (HOSPITAL_BASED_OUTPATIENT_CLINIC_OR_DEPARTMENT_OTHER): Payer: Self-pay

## 2024-01-12 DIAGNOSIS — R61 Generalized hyperhidrosis: Secondary | ICD-10-CM

## 2024-01-12 MED ORDER — GABAPENTIN 100 MG PO CAPS
ORAL_CAPSULE | ORAL | 1 refills | Status: DC
  Filled 2024-01-12: qty 60, 20d supply, fill #0
  Filled 2024-02-12: qty 60, 20d supply, fill #1

## 2024-01-12 MED ORDER — GABAPENTIN 300 MG PO CAPS: 300.0000 mg | ORAL_CAPSULE | Freq: Every day | ORAL | 3 refills | Status: AC

## 2024-01-29 ENCOUNTER — Other Ambulatory Visit (HOSPITAL_BASED_OUTPATIENT_CLINIC_OR_DEPARTMENT_OTHER): Payer: Self-pay

## 2024-01-29 MED ORDER — LOSARTAN POTASSIUM 25 MG PO TABS
25.0000 mg | ORAL_TABLET | Freq: Every day | ORAL | 3 refills | Status: DC
  Filled 2024-01-29: qty 30, 30d supply, fill #0
  Filled 2024-03-21: qty 30, 30d supply, fill #1

## 2024-02-03 ENCOUNTER — Other Ambulatory Visit (HOSPITAL_BASED_OUTPATIENT_CLINIC_OR_DEPARTMENT_OTHER): Payer: Self-pay

## 2024-02-04 ENCOUNTER — Other Ambulatory Visit (HOSPITAL_BASED_OUTPATIENT_CLINIC_OR_DEPARTMENT_OTHER): Payer: Self-pay

## 2024-02-05 ENCOUNTER — Other Ambulatory Visit: Payer: Self-pay

## 2024-02-08 ENCOUNTER — Other Ambulatory Visit (HOSPITAL_BASED_OUTPATIENT_CLINIC_OR_DEPARTMENT_OTHER): Payer: Self-pay

## 2024-02-10 ENCOUNTER — Other Ambulatory Visit (HOSPITAL_BASED_OUTPATIENT_CLINIC_OR_DEPARTMENT_OTHER): Payer: Self-pay

## 2024-02-13 ENCOUNTER — Other Ambulatory Visit (HOSPITAL_BASED_OUTPATIENT_CLINIC_OR_DEPARTMENT_OTHER): Payer: Self-pay

## 2024-02-22 ENCOUNTER — Other Ambulatory Visit: Payer: Self-pay | Admitting: Hematology and Oncology

## 2024-02-22 DIAGNOSIS — Z1231 Encounter for screening mammogram for malignant neoplasm of breast: Secondary | ICD-10-CM

## 2024-02-23 ENCOUNTER — Other Ambulatory Visit (HOSPITAL_BASED_OUTPATIENT_CLINIC_OR_DEPARTMENT_OTHER): Payer: Self-pay

## 2024-02-23 ENCOUNTER — Other Ambulatory Visit: Payer: Self-pay

## 2024-02-25 ENCOUNTER — Other Ambulatory Visit (HOSPITAL_BASED_OUTPATIENT_CLINIC_OR_DEPARTMENT_OTHER): Payer: Self-pay

## 2024-02-25 MED ORDER — OLANZAPINE 2.5 MG PO TABS
2.5000 mg | ORAL_TABLET | Freq: Every day | ORAL | 3 refills | Status: DC
Start: 2024-02-25 — End: 2024-03-31

## 2024-02-25 MED ORDER — METHYLPHENIDATE HCL ER (OSM) 18 MG PO TBCR: 18.0000 mg | EXTENDED_RELEASE_TABLET | Freq: Every morning | ORAL | 0 refills | Status: DC

## 2024-02-25 MED ORDER — DIVALPROEX SODIUM ER 500 MG PO TB24
500.0000 mg | ORAL_TABLET | Freq: Two times a day (BID) | ORAL | 3 refills | Status: AC
  Filled 2024-03-06 – 2024-03-21 (×2): qty 60, 30d supply, fill #0
  Filled 2024-05-02: qty 60, 30d supply, fill #1
  Filled 2024-06-28: qty 60, 30d supply, fill #2
  Filled 2024-10-17: qty 60, 30d supply, fill #3

## 2024-02-25 MED ORDER — TRIHEXYPHENIDYL HCL 5 MG PO TABS
5.0000 mg | ORAL_TABLET | Freq: Every evening | ORAL | 3 refills | Status: DC
Start: 2024-02-25 — End: 2024-03-31

## 2024-03-02 ENCOUNTER — Other Ambulatory Visit (HOSPITAL_BASED_OUTPATIENT_CLINIC_OR_DEPARTMENT_OTHER): Payer: Self-pay

## 2024-03-02 ENCOUNTER — Other Ambulatory Visit (HOSPITAL_BASED_OUTPATIENT_CLINIC_OR_DEPARTMENT_OTHER): Payer: Self-pay | Admitting: Obstetrics & Gynecology

## 2024-03-03 ENCOUNTER — Other Ambulatory Visit (HOSPITAL_BASED_OUTPATIENT_CLINIC_OR_DEPARTMENT_OTHER): Payer: Self-pay

## 2024-03-03 MED ORDER — GABAPENTIN 100 MG PO CAPS
300.0000 mg | ORAL_CAPSULE | Freq: Every day | ORAL | 1 refills | Status: DC
  Filled 2024-03-03: qty 60, fill #0
  Filled 2024-03-05 – 2024-03-07 (×2): qty 60, 20d supply, fill #0

## 2024-03-05 ENCOUNTER — Other Ambulatory Visit (HOSPITAL_BASED_OUTPATIENT_CLINIC_OR_DEPARTMENT_OTHER): Payer: Self-pay

## 2024-03-07 ENCOUNTER — Ambulatory Visit (INDEPENDENT_AMBULATORY_CARE_PROVIDER_SITE_OTHER): Admitting: Obstetrics & Gynecology

## 2024-03-07 ENCOUNTER — Other Ambulatory Visit: Payer: Self-pay

## 2024-03-07 ENCOUNTER — Encounter (HOSPITAL_BASED_OUTPATIENT_CLINIC_OR_DEPARTMENT_OTHER): Payer: Self-pay | Admitting: Obstetrics & Gynecology

## 2024-03-07 ENCOUNTER — Other Ambulatory Visit (HOSPITAL_BASED_OUTPATIENT_CLINIC_OR_DEPARTMENT_OTHER): Payer: Self-pay

## 2024-03-07 VITALS — BP 132/83 | HR 100 | Ht 64.0 in | Wt 200.2 lb

## 2024-03-07 DIAGNOSIS — Z79899 Other long term (current) drug therapy: Secondary | ICD-10-CM

## 2024-03-07 DIAGNOSIS — R110 Nausea: Secondary | ICD-10-CM

## 2024-03-07 DIAGNOSIS — N926 Irregular menstruation, unspecified: Secondary | ICD-10-CM | POA: Diagnosis not present

## 2024-03-07 DIAGNOSIS — R102 Pelvic and perineal pain: Secondary | ICD-10-CM

## 2024-03-07 MED ORDER — PROMETHAZINE HCL 12.5 MG PO TABS
12.5000 mg | ORAL_TABLET | Freq: Four times a day (QID) | ORAL | 1 refills | Status: AC | PRN
  Filled 2024-03-07: qty 30, 8d supply, fill #0
  Filled 2024-11-07: qty 30, 8d supply, fill #1

## 2024-03-07 MED ORDER — CYCLOBENZAPRINE HCL 10 MG PO TABS
10.0000 mg | ORAL_TABLET | Freq: Every evening | ORAL | 1 refills | Status: DC
  Filled 2024-03-07: qty 30, 30d supply, fill #0
  Filled 2024-04-01: qty 30, 30d supply, fill #1

## 2024-03-07 NOTE — Progress Notes (Signed)
 GYNECOLOGY  VISIT  CC:   pelvic pain  HPI: 48 y.o. G0P0000 Married White or Caucasian female here for complaint of pelvic pain and a rather difficult menstrual cycle that occurred with the last cycle.  This started Monday, March 10 and lasted until Monday, the 17th.  This was longer and heavier.  She two headaches during this particular cycle which is not typical.  Last week she had another headache last week and then last week she had a lot of cramping pelvic pain that started later last week.  This didn't feel like gas and didn't feel like constipation.  She does have a hx of large right cyst in the past that needed surgical removal.    Denies urinary changes.  Denies fever.  Doesn't have a lot of constipation right now with her GLP1.  She is taking Align and fiber capsules and this has helped.  Sometimes she has nausea as well and has used phenergan and zofran.    She has hx of breast cancer.  She is on Tamoxifen.    Reports no way she is pregnant as she and spouse are sexually active.     Past Medical History:  Diagnosis Date   Bipolar 1 disorder (HCC)    Complication of anesthesia    Diabetes mellitus type 2 in obese    Eczema    Family history of prostate cancer    Obesity    PONV (postoperative nausea and vomiting)    Seasonal allergies     MEDS:  Reviewed.  Medications are in triplicate but most of these are not ones I write so will not remove these.     ALLERGIES: Latex, Copper, and Copper-containing compounds  SH:  married, non smoker  Review of Systems  Constitutional: Negative.   Genitourinary:        Pelvic pain    PHYSICAL EXAMINATION:    BP 132/83 (BP Location: Left Arm, Patient Position: Sitting, Cuff Size: Large)   Pulse 100   Ht 5\' 4"  (1.626 m)   Wt 200 lb 3.2 oz (90.8 kg)   LMP 02/22/2024   BMI 34.36 kg/m     General appearance: alert, cooperative and appears stated age Abdomen: soft, non-tender; bowel sounds normal; no masses,  no  organomegaly Lymph:  no inguinal LAD noted  Pelvic: External genitalia:  no lesions              Urethra:  normal appearing urethra with no masses, tenderness or lesions              Bartholins and Skenes: normal                 Vagina: normal mucosa without prolapse or lesions              Cervix: no lesions              Bimanual Exam:  Uterus:  normal size, contour, position, consistency, mobility, non-tender              Adnexa: no mass, fullness, tenderness  Chaperone, Ina Homes, CMA, was present for exam.  Assessment/Plan: 1. Pelvic pain (Primary) - US PELVIS TRANSVAGINAL NON-OB (TV ONLY); Future - CBC with Differential/Platelet - cyclobenzaprine (FLEXERIL) 10 MG tablet; 1 tablet nightly as needed for pelvic floor pain.  Dispense: 30 tablet; Refill: 1  2. Irregular menstrual cycle - Estradiol - Follicle stimulating hormone  3. Long-term use of high-risk medication - Valproic acid level  4. Nausea -  promethazine (PHENERGAN) 12.5 MG tablet; Take 1 tablet (12.5 mg total) by mouth every 6 (six) hours as needed for nausea or vomiting.  Dispense: 30 tablet; Refill: 1

## 2024-03-08 LAB — CBC WITH DIFFERENTIAL/PLATELET
Basophils Absolute: 0 10*3/uL (ref 0.0–0.2)
Basos: 0 %
EOS (ABSOLUTE): 0.1 10*3/uL (ref 0.0–0.4)
Eos: 1 %
Hematocrit: 37.7 % (ref 34.0–46.6)
Hemoglobin: 12.4 g/dL (ref 11.1–15.9)
Immature Grans (Abs): 0 10*3/uL (ref 0.0–0.1)
Immature Granulocytes: 0 %
Lymphocytes Absolute: 1.9 10*3/uL (ref 0.7–3.1)
Lymphs: 35 %
MCH: 28.6 pg (ref 26.6–33.0)
MCHC: 32.9 g/dL (ref 31.5–35.7)
MCV: 87 fL (ref 79–97)
Monocytes Absolute: 0.4 10*3/uL (ref 0.1–0.9)
Monocytes: 8 %
Neutrophils Absolute: 2.9 10*3/uL (ref 1.4–7.0)
Neutrophils: 56 %
Platelets: 336 10*3/uL (ref 150–450)
RBC: 4.34 x10E6/uL (ref 3.77–5.28)
RDW: 13 % (ref 11.7–15.4)
WBC: 5.3 10*3/uL (ref 3.4–10.8)

## 2024-03-08 LAB — ESTRADIOL: Estradiol: 187 pg/mL

## 2024-03-08 LAB — VALPROIC ACID LEVEL: Valproic Acid Lvl: 52 ug/mL (ref 50–100)

## 2024-03-08 LAB — FOLLICLE STIMULATING HORMONE: FSH: 12.1 m[IU]/mL

## 2024-03-16 ENCOUNTER — Ambulatory Visit (INDEPENDENT_AMBULATORY_CARE_PROVIDER_SITE_OTHER): Admitting: Obstetrics & Gynecology

## 2024-03-16 ENCOUNTER — Ambulatory Visit (INDEPENDENT_AMBULATORY_CARE_PROVIDER_SITE_OTHER)

## 2024-03-16 VITALS — BP 135/61 | HR 99 | Ht 64.0 in | Wt 200.0 lb

## 2024-03-16 DIAGNOSIS — R102 Pelvic and perineal pain: Secondary | ICD-10-CM

## 2024-03-16 DIAGNOSIS — M6289 Other specified disorders of muscle: Secondary | ICD-10-CM | POA: Diagnosis not present

## 2024-03-17 ENCOUNTER — Ambulatory Visit
Admission: RE | Admit: 2024-03-17 | Discharge: 2024-03-17 | Disposition: A | Discharge status: Home / Self Care | Source: Ambulatory Visit | Attending: Hematology and Oncology

## 2024-03-17 DIAGNOSIS — Z1231 Encounter for screening mammogram for malignant neoplasm of breast: Secondary | ICD-10-CM

## 2024-03-19 ENCOUNTER — Encounter (HOSPITAL_BASED_OUTPATIENT_CLINIC_OR_DEPARTMENT_OTHER): Payer: Self-pay | Admitting: Obstetrics & Gynecology

## 2024-03-19 NOTE — Progress Notes (Signed)
 GYNECOLOGY  VISIT  CC:   Discuss ultrasound results, pelvic pain  HPI: 48 y.o. G0P0000 Married White or Caucasian female here for discussion of ultrasound results.  Ultrasound obtained due to pelvic pain.  She has been using flexeril and this has helped.  Using at night in particular as this does give enough symptom improvement to allow for fairly good sleep.    Ultrasound essentially normal.  Uterus normal.  Endometrium 5mm.  Ovaries normal except for follicle on left ovary.  Pt is disappointment/frustrated that ultrasound didn't really show anything.  Lab work form 3/24 reviewed with pt personally today.  Valproic acid level was obtained for psychiatrist.  I did try to forward this result but could not find provider in [employer] so pt will take responsibility for getting this result to her provider.    Discussed considering pelvic PT.  The copay was an impediment to doing a full course of PT.  Has new insurance.  Will check to see if has better coverage now.  Would like to have new referral placed.     Past Medical History:  Diagnosis Date   Bipolar 1 disorder (HCC)    Complication of anesthesia    Diabetes mellitus type 2 in obese    Eczema    Family history of prostate cancer    Obesity    PONV (postoperative nausea and vomiting)    Seasonal allergies     MEDS:   Current Outpatient Medications on File Prior to Visit  Medication Sig Dispense Refill   ALPRAZolam (XANAX) 0.5 MG tablet Take 0.5 tablets (0.25 mg total) by mouth 2 (two) times daily as needed. (Patient not taking: Reported on 03/07/2024) 30 tablet 2   Continuous Blood Gluc Receiver (FREESTYLE LIBRE READER) DEVI 1 Device by Does not apply route once for 1 dose. Dispense Freestyle Libre 3 (Patient not taking: Reported on 03/07/2024) 1 each 0   Continuous Blood Gluc Sensor (FREESTYLE LIBRE 3 SENSOR) MISC 1 Units by Does not apply route every 14 (fourteen) days. (Patient not taking: Reported on 03/07/2024) 2 each 11   Crisaborole  (EUCRISA) 2 % OINT Apply topically 2 (two) times daily. 100 g 1   cyclobenzaprine (FLEXERIL) 10 MG tablet Take 1 tablet by mouth nightly as needed for pelvic floor pain. 30 tablet 1   divalproex (DEPAKOTE ER) 500 MG 24 hr tablet Take 1 tablet (500 mg total) by mouth 2 (two) times daily. 180 tablet 1   divalproex (DEPAKOTE ER) 500 MG 24 hr tablet Take 1 tablet (500 mg total) by mouth 2 (two) times daily. 60 tablet 2   divalproex (DEPAKOTE ER) 500 MG 24 hr tablet Take 1 tablet (500 mg total) by mouth 2 (two) times daily. 30 tablet 3   divalproex (DEPAKOTE ER) 500 MG 24 hr tablet Take 1 tablet (500 mg total) by mouth in the morning and at bedtime. 60 tablet 3   divalproex (DEPAKOTE ER) 500 MG 24 hr tablet Take 1 tablet (500 mg total) by mouth 2 (two) times daily. 60 tablet 3   fluocinonide cream (LIDEX) 0.05 % Apply topically 2 (two) times daily. 60 g 1   fluocinonide cream (LIDEX) 0.05 % Apply topically 2 (two) times daily. 60 g 1   gabapentin (NEURONTIN) 100 MG capsule Take 3 capsules (300 mg total) by mouth at bedtime. 60 capsule 1   gabapentin (NEURONTIN) 300 MG capsule Take 1 capsule (300 mg total) by mouth at bedtime. 90 capsule 3   losartan (COZAAR) 25  MG tablet Take 1 tablet (25 mg total) by mouth daily. (Patient not taking: Reported on 03/07/2024) 90 tablet 1   losartan (COZAAR) 25 MG tablet Take one tablet (25 mg dose) by mouth daily. 90 tablet 3   metFORMIN (GLUCOPHAGE-XR) 500 MG 24 hr tablet Take 4 tablets (2,000 mg total) by mouth daily with breakfast. 360 tablet 3   methylphenidate 18 MG PO CR tablet Take 1 tablet (18 mg total) by mouth in the morning. 30 tablet 0   methylphenidate 18 MG PO CR tablet Take 1 tablet (18 mg total) by mouth in the morning. 30 tablet 0   methylphenidate 18 MG PO CR tablet Take 1 tablet (18 mg total) by mouth in the morning. 30 tablet 0   [START ON 04/21/2024] methylphenidate 18 MG PO CR tablet Take 1 tablet (18 mg total) by mouth every morning. 30 tablet 0    [START ON 05/19/2024] methylphenidate 18 MG PO CR tablet Take 1 tablet (18 mg total) by mouth every morning. 30 tablet 0   [START ON 03/24/2024] methylphenidate 18 MG PO CR tablet Take 1 tablet (18 mg total) by mouth in the morning. 30 tablet 0   Na Sulfate-K Sulfate-Mg Sulf 17.5-3.13-1.6 GM/177ML SOLN Take 177 mLs by mouth 2 (two) times daily. 354 mL 0   nystatin ointment (MYCOSTATIN) Apply 1 Application topically 3 (three) times daily for 10 days 30 g 0   OLANZapine (ZYPREXA) 2.5 MG tablet Take 1 tablet (2.5 mg total) by mouth at bedtime. 90 tablet 1   OLANZapine (ZYPREXA) 2.5 MG tablet Take 1 tablet (2.5 mg total) by mouth at bedtime. 30 tablet 2   OLANZapine (ZYPREXA) 2.5 MG tablet Take 1 tablet (2.5 mg total) by mouth daily. 30 tablet 3   OLANZapine (ZYPREXA) 2.5 MG tablet Take 1 tablet (2.5 mg total) by mouth daily. 30 tablet 3   OLANZapine (ZYPREXA) 2.5 MG tablet Take 1 tablet (2.5 mg total) by mouth daily. 30 tablet 3   ondansetron (ZOFRAN) 4 MG tablet Take one tablet (4 mg dose) by mouth daily as needed for Nausea. 30 tablet 0   ondansetron (ZOFRAN) 4 MG tablet Take 1 tablet (4 mg total) by mouth daily as needed for nausea. 60 tablet 0   promethazine (PHENERGAN) 12.5 MG tablet Take 1 tablet (12.5 mg total) by mouth every 6 (six) hours as needed for nausea or vomiting. 30 tablet 1   rizatriptan (MAXALT) 5 MG tablet Take 1 tablet (5 mg total) by mouth as needed for migraine. May repeat in 2 hours if needed (Patient not taking: Reported on 03/07/2024) 30 tablet 0   tamoxifen (NOLVADEX) 20 MG tablet Take 1 tablet (20 mg total) by mouth daily. 90 tablet 4   tirzepatide (MOUNJARO) 10 MG/0.5ML Pen Inject 10 mg into the skin every 7 (seven) days. (Patient not taking: Reported on 07/03/2023) 2 mL 5   tirzepatide (MOUNJARO) 12.5 MG/0.5ML Pen Inject 12.5 mg into the skin once a week. 2 mL 5   tirzepatide (MOUNJARO) 15 MG/0.5ML Pen Inject 15 mg into the skin every 7 (seven) days. 2 mL 5   tirzepatide  (MOUNJARO) 2.5 MG/0.5ML Pen Inject 2.5 mg into the skin once a week. For 4 weeks (Patient not taking: Reported on 07/03/2023) 2 mL 0   tirzepatide (MOUNJARO) 5 MG/0.5ML Pen Inject 5 mg into the skin once a week. (Patient not taking: Reported on 07/03/2023) 6 mL 0   triamcinolone cream (KENALOG) 0.1 % Apply topically 2 (two) times daily. *  hold on file- patient will call when ready for refill (Patient not taking: Reported on 07/03/2023) 80 g 0   trihexyphenidyl (ARTANE) 5 MG tablet Take 1 tablet (5 mg total) by mouth at bedtime. 90 tablet 1   trihexyphenidyl (ARTANE) 5 MG tablet Take 1 tablet (5 mg total) by mouth daily. 30 tablet 2   trihexyphenidyl (ARTANE) 5 MG tablet Take 1 tablet (5 mg total) by mouth at bedtime with a meal. 30 tablet 3   trihexyphenidyl (ARTANE) 5 MG tablet Take 1 tablet (5 mg total) with a meal nightly 30 tablet 3   trihexyphenidyl (ARTANE) 5 MG tablet Take 1 tablet (5 mg total) by mouth nightly with a meal. 30 tablet 3   No current facility-administered medications on file prior to visit.    ALLERGIES: Latex, Copper, and Copper-containing compounds  SH:  married, non smoker  Review of Systems  Constitutional: Negative.   Genitourinary:        Pelvic pian    PHYSICAL EXAMINATION:    BP 135/61 (BP Location: Right Arm, Patient Position: Sitting, Cuff Size: Normal)   Pulse 99   Ht 5\' 4"  (1.626 m)   Wt 200 lb (90.7 kg)   LMP 02/22/2024   BMI 34.33 kg/m      Physical Exam Constitutional:      Appearance: Normal appearance.  Neurological:     General: No focal deficit present.     Mental Status: She is alert.  Psychiatric:        Mood and Affect: Mood normal.     Assessment/Plan: 1. Pelvic pain (Primary) - will continue flexeril nightly.  Does not need RF right now - Ambulatory referral to Physical Therapy  2. Pelvic floor dysfunction - Ambulatory referral to Physical Therapy  Total time with pt:  38 minutes Time with documentation:  5 minutes Total  time:  43 minutes

## 2024-03-21 ENCOUNTER — Other Ambulatory Visit: Payer: Self-pay

## 2024-03-21 ENCOUNTER — Other Ambulatory Visit (HOSPITAL_BASED_OUTPATIENT_CLINIC_OR_DEPARTMENT_OTHER): Payer: Self-pay

## 2024-03-22 ENCOUNTER — Other Ambulatory Visit (HOSPITAL_BASED_OUTPATIENT_CLINIC_OR_DEPARTMENT_OTHER): Payer: Self-pay

## 2024-03-24 ENCOUNTER — Other Ambulatory Visit (HOSPITAL_BASED_OUTPATIENT_CLINIC_OR_DEPARTMENT_OTHER): Payer: Self-pay

## 2024-03-30 ENCOUNTER — Other Ambulatory Visit (HOSPITAL_BASED_OUTPATIENT_CLINIC_OR_DEPARTMENT_OTHER): Payer: Self-pay

## 2024-03-31 ENCOUNTER — Inpatient Hospital Stay: Payer: Managed Care, Other (non HMO) | Attending: Hematology and Oncology | Admitting: Hematology and Oncology

## 2024-03-31 ENCOUNTER — Other Ambulatory Visit: Payer: Self-pay

## 2024-03-31 ENCOUNTER — Other Ambulatory Visit (HOSPITAL_BASED_OUTPATIENT_CLINIC_OR_DEPARTMENT_OTHER): Payer: Self-pay

## 2024-03-31 VITALS — BP 141/93 | HR 103 | Temp 98.1°F | Resp 18 | Ht 64.0 in | Wt 196.3 lb

## 2024-03-31 DIAGNOSIS — C50211 Malignant neoplasm of upper-inner quadrant of right female breast: Secondary | ICD-10-CM | POA: Diagnosis present

## 2024-03-31 DIAGNOSIS — Z170 Estrogen receptor positive status [ER+]: Secondary | ICD-10-CM | POA: Diagnosis not present

## 2024-03-31 DIAGNOSIS — Z79810 Long term (current) use of selective estrogen receptor modulators (SERMs): Secondary | ICD-10-CM | POA: Insufficient documentation

## 2024-03-31 MED ORDER — TAMOXIFEN CITRATE 20 MG PO TABS
20.0000 mg | ORAL_TABLET | Freq: Every day | ORAL | 4 refills | Status: AC
  Filled 2024-03-31: qty 30, 30d supply, fill #0
  Filled 2024-05-25: qty 30, 30d supply, fill #1
  Filled 2024-07-04: qty 30, 30d supply, fill #2
  Filled 2024-08-03: qty 30, 30d supply, fill #3
  Filled 2024-09-01: qty 30, 30d supply, fill #4
  Filled 2024-10-11: qty 30, 30d supply, fill #5
  Filled 2024-11-07: qty 30, 30d supply, fill #6
  Filled 2024-12-18: qty 30, 30d supply, fill #7
  Filled 2025-01-19: qty 30, 30d supply, fill #8

## 2024-03-31 NOTE — Progress Notes (Signed)
 Patient Care Team: [name], MD as PCP - General (Family Medicine) [name], MD as Consulting Physician (General Surgery) [name], MD as Consulting Physician (Radiation Oncology)  DIAGNOSIS:  Encounter Diagnosis  Name Primary?   Malignant neoplasm of upper-inner quadrant of right breast in female, estrogen receptor positive (HCC) Yes    SUMMARY OF ONCOLOGIC HISTORY: Oncology History  Malignant neoplasm of upper-inner quadrant of right breast in female, estrogen receptor positive (HCC)  03/01/2020 Oncotype testing   MammaPrint low risk.   03/05/2020 Initial Diagnosis   Malignant neoplasm of upper-inner quadrant of right breast in female, estrogen receptor positive (HCC)   03/25/2020 Genetic Testing   Negative genetic testing:  No pathogenic variants detected on the [facility] Breast Cancer STAT Panel or Common Hereditary Cancers Panel. The report date is 03/25/2020.  The Breast Cancer STAT Panel offered by [facility] includes sequencing and deletion/duplication analysis for the following 9 genes:  ATM, BRCA1, BRCA2, CDH1, CHEK2, PALB2, PTEN, STK11 and TP53.  The Common Hereditary Cancers Panel offered by [facility] includes sequencing and/or deletion duplication testing of the following 48 genes: APC, ATM, AXIN2, BARD1, BMPR1A, BRCA1, BRCA2, BRIP1, CDH1, CDK4, CDKN2A (p14ARF), CDKN2A (p16INK4a), CHEK2, CTNNA1, DICER1, EPCAM (Deletion/duplication testing only), GREM1 (promoter region deletion/duplication testing only), KIT, MEN1, MLH1, MSH2, MSH3, MSH6, MUTYH, NBN, NF1, NHTL1, PALB2, PDGFRA, PMS2, POLD1, POLE, PTEN, RAD50, RAD51C, RAD51D, RNF43, SDHB, SDHC, SDHD, SMAD4, SMARCA4. STK11, TP53, TSC1, TSC2, and VHL.  The following genes were evaluated for sequence changes only: SDHA and HOXB13 c.251G>A variant only.   04/04/2020 Cancer Staging   Staging form: Breast, AJCC 8th Edition - Pathologic stage from 04/04/2020: Stage IA (pT1c, pN0, cM0, G1, ER+, PR+, HER2-)   04/04/2020 Surgery    Right lumpectomy Magnus Ivan) [phone]): IDC, grade 1, with intermediate grade DCIS. Negative margins. 4 axillary lymph nodes were negative.   05/02/2020 - 06/15/2020 Radiation Therapy   The patient initially received a dose of 50.4 Gy in 28 fractions to the breast using whole-breast tangent fields. This was delivered using a 3-D conformal technique. The patient then received a boost to the seroma. This delivered an additional 10 Gy in 5 fractions using a electron technique. The total dose was 60.4 Gy.   07/2020 -  Anti-estrogen oral therapy   Tamoxifen     CHIEF COMPLIANT: Surveillance of cancer on tamoxifen therapy  HISTORY OF PRESENT ILLNESS:   History of Present Illness [name] is a 48 year old with prior history of breast cancer and a history of bipolar disorder, ADHD,  presents for a routine follow-up. She reports significant lifestyle changes over the past year, including joining a fitness center and focusing on a healthier diet. She has lost approximately 96 pounds and her A1C has decreased to 6.3. She also reports improved mental health, with a new provider and the addition of Concerta for ADHD. She notes that her eczema has improved, which she attributes to reduced stress and improved self-care. She also reports pelvic floor pain, which she is managing with Flexeril and pelvic therapy. She has had a recent vaginal ultrasound to rule out cysts, with no significant findings. She is currently on tamoxifen for breast cancer, with recent mammogram showing no recurrence.     ALLERGIES:  is allergic to latex, copper, and copper-containing compounds.  MEDICATIONS:  Current Outpatient Medications  Medication Sig Dispense Refill   Crisaborole (EUCRISA) 2 % OINT Apply topically 2 (two) times daily. 100 g 1   cyclobenzaprine (FLEXERIL) 10 MG tablet Take 1 tablet  by mouth nightly as needed for pelvic floor pain. 30 tablet 1   divalproex (DEPAKOTE ER) 500 MG 24 hr tablet Take 1 tablet (500 mg  total) by mouth 2 (two) times daily. 60 tablet 3   fluocinonide cream (LIDEX) 0.05 % Apply topically 2 (two) times daily. 60 g 1   gabapentin (NEURONTIN) 300 MG capsule Take 1 capsule (300 mg total) by mouth at bedtime. 90 capsule 3   metFORMIN (GLUCOPHAGE-XR) 500 MG 24 hr tablet Take 4 tablets (2,000 mg total) by mouth daily with breakfast. 360 tablet 3   methylphenidate 18 MG PO CR tablet Take 1 tablet (18 mg total) by mouth in the morning. 30 tablet 0   nystatin ointment (MYCOSTATIN) Apply 1 Application topically 3 (three) times daily for 10 days 30 g 0   OLANZapine (ZYPREXA) 2.5 MG tablet Take 1 tablet (2.5 mg total) by mouth at bedtime. 90 tablet 1   OLANZapine (ZYPREXA) 2.5 MG tablet Take 1 tablet (2.5 mg total) by mouth daily. 30 tablet 3   tamoxifen (NOLVADEX) 20 MG tablet Take 1 tablet (20 mg total) by mouth daily. 90 tablet 4   trihexyphenidyl (ARTANE) 5 MG tablet Take 1 tablet (5 mg total) by mouth at bedtime. 90 tablet 1   ALPRAZolam (XANAX) 0.5 MG tablet Take 0.5 tablets (0.25 mg total) by mouth 2 (two) times daily as needed. (Patient not taking: Reported on 03/31/2024) 30 tablet 2   Continuous Blood Gluc Receiver (FREESTYLE LIBRE READER) DEVI 1 Device by Does not apply route once for 1 dose. Dispense Freestyle Libre 3 (Patient not taking: Reported on 07/03/2023) 1 each 0   Continuous Blood Gluc Sensor (FREESTYLE LIBRE 3 SENSOR) MISC 1 Units by Does not apply route every 14 (fourteen) days. (Patient not taking: Reported on 07/03/2023) 2 each 11   losartan (COZAAR) 25 MG tablet Take 1 tablet (25 mg total) by mouth daily. (Patient not taking: Reported on 03/31/2024) 90 tablet 1   OLANZapine (ZYPREXA) 2.5 MG tablet Take 1 tablet (2.5 mg total) by mouth daily. 30 tablet 3   ondansetron (ZOFRAN) 4 MG tablet Take 1 tablet (4 mg total) by mouth daily as needed for nausea. (Patient not taking: Reported on 03/31/2024) 60 tablet 0   promethazine (PHENERGAN) 12.5 MG tablet Take 1 tablet (12.5 mg total)  by mouth every 6 (six) hours as needed for nausea or vomiting. (Patient not taking: Reported on 03/31/2024) 30 tablet 1   rizatriptan (MAXALT) 5 MG tablet Take 1 tablet (5 mg total) by mouth as needed for migraine. May repeat in 2 hours if needed (Patient not taking: Reported on 07/03/2023) 30 tablet 0   tirzepatide (MOUNJARO) 12.5 MG/0.5ML Pen Inject 12.5 mg into the skin once a week. 2 mL 5   tirzepatide (MOUNJARO) 15 MG/0.5ML Pen Inject 15 mg into the skin every 7 (seven) days. 2 mL 5   tirzepatide (MOUNJARO) 2.5 MG/0.5ML Pen Inject 2.5 mg into the skin once a week. For 4 weeks (Patient not taking: Reported on 03/31/2024) 2 mL 0   tirzepatide (MOUNJARO) 5 MG/0.5ML Pen Inject 5 mg into the skin once a week. (Patient not taking: Reported on 03/31/2024) 6 mL 0   triamcinolone cream (KENALOG) 0.1 % Apply topically 2 (two) times daily. * hold on file- patient will call when ready for refill (Patient not taking: Reported on 03/31/2024) 80 g 0   No current facility-administered medications for this visit.    PHYSICAL EXAMINATION: ECOG PERFORMANCE STATUS: 1 - Symptomatic but  completely ambulatory  Vitals:   03/31/24 0900  BP: (!) 141/93  Pulse: (!) 103  Resp: 18  Temp: 98.1 F (36.7 C)  SpO2: 100%   Filed Weights   03/31/24 0900  Weight: 196 lb 4.8 oz (89 kg)    Physical Exam MEASUREMENTS: Weight- 196.3.  (exam performed in the presence of a chaperone)  LABORATORY DATA:  I have reviewed the data as listed    Latest Ref Rng & Units 08/03/2023   11:43 AM 03/27/2022    8:49 AM 11/28/2021    9:22 AM  CMP  Glucose 70 - 99 mg/dL 409  811  914   BUN 6 - 24 mg/dL 6  13  12    Creatinine 0.57 - 1.00 mg/dL 7.82  9.56  2.13   Sodium 134 - 144 mmol/L 140  134  139   Potassium 3.5 - 5.2 mmol/L 4.3  4.1  3.8   Chloride 96 - 106 mmol/L 101  100  103   CO2 20 - 29 mmol/L 24  27  24    Calcium 8.7 - 10.2 mg/dL 9.7  9.2  8.7   Total Protein 6.0 - 8.5 g/dL 6.4  6.9  6.7   Total Bilirubin 0.0 - 1.2  mg/dL 0.2  0.3  0.3   Alkaline Phos 44 - 121 IU/L 44  47  50   AST 0 - 40 IU/L 12  13  12    ALT 0 - 32 IU/L 12  14  13      Lab Results  Component Value Date   WBC 5.3 03/07/2024   HGB 12.4 03/07/2024   HCT 37.7 03/07/2024   MCV 87 03/07/2024   PLT 336 03/07/2024   NEUTROABS 2.9 03/07/2024    ASSESSMENT & PLAN:  Malignant neoplasm of upper-inner quadrant of right breast in female, estrogen receptor positive (HCC) 03/01/2020: TX N0 stage Ia IDC grade 1 ER/PR positive HER2 negative Ki-67 2%, MammaPrint: Low risk 03/25/2020: Genetics negative 04/04/2020: T1c N0 stage Ia grade 1 IDC with negative margins, 0/4 axillary lymph nodes negative 05/02/2020-06/15/2020: Adjuvant radiation   Current treatment: Tamoxifen started 07/15/2020 (plan of treatment: 10 years) Tamoxifen toxicities: Very intermittent hot flashes and night sweats approximately once a week.  She has been doing better since she started taking tamoxifen at bedtime.   Intermittent right arm sensitivity to touch: Could be related to any unusual activity or exertion.  I do not think that there is any current issues that we need to investigate for the right arm. Eczema on the dorsum of her right hand: Using topical steroid creams.   We discussed the role of supplements.  I recommended that she take vitamin D 2000 international units daily. Left knee arthritis: She is hoping that with weight loss she is going to start to do better.  She has lost 100 pounds since she started [facility].   Bipolar 1 and ADHD: Under excellent control   Breast cancer surveillance: Breast exam 03/31/2024: Benign Mammogram 03/18/2024: Benign breast density category B   Return to clinic in 1 year for follow-up ------------------------------------- Assessment and Plan Assessment & Plan Malignant neoplasm of upper-inner quadrant of right breast, estrogen receptor positive Estrogen receptor-positive breast cancer in the upper-inner quadrant of the right breast.  She has been on tamoxifen for four years with good tolerance. Recent mammogram shows no evidence of recurrence. Gardant Reveal, a blood test for circulating tumor DNA, can identify recurrence up to two years before clinical manifestation. - Continue tamoxifen for  a total of ten years. - Offer Gardant Reveal blood test every six months for early detection of recurrence. Arrange home blood draw and follow-up call a week after the test to discuss results. Inform her about insurance billing and no charge if not covered.  Pelvic floor pain Ongoing pelvic floor pain managed with cyclobenzaprine at bedtime. Recent vaginal ultrasound ruled out cysts. - Continue cyclobenzaprine (Flexeril) at bedtime. - Encourage participation in pelvic therapy classes. - Consider further evaluation if pain persists or worsens.      No orders of the defined types were placed in this encounter.  The patient has a good understanding of the overall plan. she agrees with it. she will call with any problems that may develop before the next visit here. Total time spent: 30 mins including face to face time and time spent for planning, charting and co-ordination of care   Viinay K Tache Bobst, MD 03/31/24

## 2024-03-31 NOTE — Assessment & Plan Note (Signed)
 03/01/2020: TX N0 stage Ia IDC grade 1 ER/PR positive HER2 negative Ki-67 2%, MammaPrint: Low risk 03/25/2020: Genetics negative 04/04/2020: T1c N0 stage Ia grade 1 IDC with negative margins, 0/4 axillary lymph nodes negative 05/02/2020-06/15/2020: Adjuvant radiation   Current treatment: Tamoxifen started 07/15/2020 (plan of treatment: 10 years) Tamoxifen toxicities: Very intermittent hot flashes and night sweats approximately once a week.  She has been doing better since she started taking tamoxifen at bedtime.   Intermittent right arm sensitivity to touch: Could be related to any unusual activity or exertion.  I do not think that there is any current issues that we need to investigate for the right arm. Eczema on the dorsum of her right hand: Using topical steroid creams.   We discussed the role of supplements.  I recommended that she take vitamin D 2000 international units daily. Left knee arthritis: She is hoping that with weight loss she is going to start to do better.  She has lost 30 to 40 pounds since she started Mounjaro.     Breast cancer surveillance: Breast exam 03/31/2024: Benign Mammogram 03/18/2024: Benign breast density category B   Return to clinic in 1 year for follow-up

## 2024-04-01 ENCOUNTER — Other Ambulatory Visit (HOSPITAL_BASED_OUTPATIENT_CLINIC_OR_DEPARTMENT_OTHER): Payer: Self-pay | Admitting: Obstetrics & Gynecology

## 2024-04-01 ENCOUNTER — Other Ambulatory Visit (HOSPITAL_BASED_OUTPATIENT_CLINIC_OR_DEPARTMENT_OTHER): Payer: Self-pay

## 2024-04-02 ENCOUNTER — Other Ambulatory Visit (HOSPITAL_BASED_OUTPATIENT_CLINIC_OR_DEPARTMENT_OTHER): Payer: Self-pay

## 2024-04-04 ENCOUNTER — Telehealth: Payer: Self-pay

## 2024-04-04 NOTE — Telephone Encounter (Signed)
 Per md orders entered for Guardant Reveal and all supported documents faxed to [phone]. Faxed confirmation was received.

## 2024-04-05 ENCOUNTER — Other Ambulatory Visit (HOSPITAL_BASED_OUTPATIENT_CLINIC_OR_DEPARTMENT_OTHER): Payer: Self-pay | Admitting: Obstetrics & Gynecology

## 2024-04-05 ENCOUNTER — Other Ambulatory Visit (HOSPITAL_BASED_OUTPATIENT_CLINIC_OR_DEPARTMENT_OTHER): Payer: Self-pay

## 2024-04-06 ENCOUNTER — Other Ambulatory Visit (HOSPITAL_BASED_OUTPATIENT_CLINIC_OR_DEPARTMENT_OTHER): Payer: Self-pay

## 2024-04-06 ENCOUNTER — Encounter (HOSPITAL_BASED_OUTPATIENT_CLINIC_OR_DEPARTMENT_OTHER): Payer: Self-pay | Admitting: Obstetrics & Gynecology

## 2024-04-07 ENCOUNTER — Other Ambulatory Visit (HOSPITAL_BASED_OUTPATIENT_CLINIC_OR_DEPARTMENT_OTHER): Payer: Self-pay

## 2024-04-07 MED ORDER — GABAPENTIN 100 MG PO CAPS
300.0000 mg | ORAL_CAPSULE | Freq: Every day | ORAL | 1 refills | Status: DC
  Filled 2024-04-07: qty 60, 20d supply, fill #0
  Filled 2024-04-27: qty 60, 20d supply, fill #1

## 2024-04-12 ENCOUNTER — Other Ambulatory Visit (HOSPITAL_BASED_OUTPATIENT_CLINIC_OR_DEPARTMENT_OTHER): Payer: Self-pay

## 2024-04-12 MED ORDER — METHYLPHENIDATE HCL ER (OSM) 18 MG PO TBCR
18.0000 mg | EXTENDED_RELEASE_TABLET | Freq: Every morning | ORAL | 0 refills | Status: AC
  Filled 2024-04-12: qty 30, 30d supply, fill #0

## 2024-04-12 MED ORDER — METHYLPHENIDATE HCL ER (OSM) 18 MG PO TBCR
18.0000 mg | EXTENDED_RELEASE_TABLET | Freq: Every morning | ORAL | 0 refills | Status: AC
  Filled 2024-05-25 – 2024-06-28 (×2): qty 30, 30d supply, fill #0

## 2024-04-12 MED ORDER — METHYLPHENIDATE HCL ER (OSM) 18 MG PO TBCR
18.0000 mg | EXTENDED_RELEASE_TABLET | Freq: Every morning | ORAL | 0 refills | Status: AC
  Filled 2024-08-03: qty 30, 30d supply, fill #0

## 2024-04-15 ENCOUNTER — Other Ambulatory Visit (HOSPITAL_BASED_OUTPATIENT_CLINIC_OR_DEPARTMENT_OTHER): Payer: Self-pay

## 2024-04-15 NOTE — Progress Notes (Signed)
 MD ordered Guardant Reveal on pt. Patient did not respond to [employer] when they reached out. If pt returns call to set up testing, number is [phone].

## 2024-04-27 ENCOUNTER — Other Ambulatory Visit (HOSPITAL_BASED_OUTPATIENT_CLINIC_OR_DEPARTMENT_OTHER): Payer: Self-pay | Admitting: Obstetrics & Gynecology

## 2024-04-27 ENCOUNTER — Other Ambulatory Visit (HOSPITAL_BASED_OUTPATIENT_CLINIC_OR_DEPARTMENT_OTHER): Payer: Self-pay

## 2024-04-27 DIAGNOSIS — R102 Pelvic and perineal pain: Secondary | ICD-10-CM

## 2024-04-28 ENCOUNTER — Other Ambulatory Visit (HOSPITAL_BASED_OUTPATIENT_CLINIC_OR_DEPARTMENT_OTHER): Payer: Self-pay

## 2024-04-28 ENCOUNTER — Other Ambulatory Visit: Payer: Self-pay

## 2024-04-28 MED ORDER — CYCLOBENZAPRINE HCL 10 MG PO TABS
10.0000 mg | ORAL_TABLET | Freq: Every evening | ORAL | 1 refills | Status: DC
  Filled 2024-04-28: qty 30, 30d supply, fill #0
  Filled 2024-06-14: qty 30, 30d supply, fill #1

## 2024-05-02 ENCOUNTER — Other Ambulatory Visit (HOSPITAL_BASED_OUTPATIENT_CLINIC_OR_DEPARTMENT_OTHER): Payer: Self-pay

## 2024-05-05 ENCOUNTER — Encounter (HOSPITAL_BASED_OUTPATIENT_CLINIC_OR_DEPARTMENT_OTHER): Payer: Self-pay

## 2024-05-05 ENCOUNTER — Other Ambulatory Visit (HOSPITAL_BASED_OUTPATIENT_CLINIC_OR_DEPARTMENT_OTHER): Payer: Self-pay

## 2024-05-06 ENCOUNTER — Other Ambulatory Visit (HOSPITAL_BASED_OUTPATIENT_CLINIC_OR_DEPARTMENT_OTHER): Payer: Self-pay

## 2024-05-06 MED ORDER — LOSARTAN POTASSIUM 25 MG PO TABS
25.0000 mg | ORAL_TABLET | Freq: Every day | ORAL | 3 refills | Status: AC
Start: 2024-05-06 — End: ?
  Filled 2024-05-06 – 2024-05-07 (×2): qty 30, 30d supply, fill #0
  Filled 2024-05-25: qty 30, 30d supply, fill #1
  Filled 2024-06-28: qty 30, 30d supply, fill #2
  Filled 2024-07-23: qty 30, 30d supply, fill #3
  Filled 2024-08-29: qty 30, 30d supply, fill #4
  Filled 2024-10-11: qty 30, 30d supply, fill #5
  Filled 2024-10-27 – 2024-11-07 (×2): qty 30, 30d supply, fill #6
  Filled 2025-01-01: qty 30, 30d supply, fill #7

## 2024-05-06 MED ORDER — MOUNJARO 15 MG/0.5ML ~~LOC~~ SOAJ
15.0000 mg | SUBCUTANEOUS | 5 refills | Status: AC
  Filled 2024-05-06: qty 2, 28d supply, fill #0
  Filled 2024-06-02 – 2024-06-08 (×2): qty 2, 28d supply, fill #1
  Filled 2024-07-23: qty 2, 28d supply, fill #2
  Filled 2024-08-11 – 2024-08-15 (×2): qty 2, 28d supply, fill #3
  Filled 2024-09-19: qty 2, 28d supply, fill #4
  Filled 2024-10-15: qty 2, 28d supply, fill #5

## 2024-05-07 ENCOUNTER — Other Ambulatory Visit (HOSPITAL_BASED_OUTPATIENT_CLINIC_OR_DEPARTMENT_OTHER): Payer: Self-pay

## 2024-05-10 ENCOUNTER — Other Ambulatory Visit (HOSPITAL_BASED_OUTPATIENT_CLINIC_OR_DEPARTMENT_OTHER): Payer: Self-pay

## 2024-05-14 ENCOUNTER — Other Ambulatory Visit (HOSPITAL_BASED_OUTPATIENT_CLINIC_OR_DEPARTMENT_OTHER): Payer: Self-pay

## 2024-05-19 ENCOUNTER — Other Ambulatory Visit (HOSPITAL_BASED_OUTPATIENT_CLINIC_OR_DEPARTMENT_OTHER): Payer: Self-pay

## 2024-05-19 ENCOUNTER — Encounter (HOSPITAL_BASED_OUTPATIENT_CLINIC_OR_DEPARTMENT_OTHER): Payer: Self-pay

## 2024-05-25 ENCOUNTER — Other Ambulatory Visit (HOSPITAL_BASED_OUTPATIENT_CLINIC_OR_DEPARTMENT_OTHER): Payer: Self-pay | Admitting: Obstetrics & Gynecology

## 2024-05-25 ENCOUNTER — Other Ambulatory Visit: Payer: Self-pay

## 2024-05-25 ENCOUNTER — Other Ambulatory Visit (HOSPITAL_BASED_OUTPATIENT_CLINIC_OR_DEPARTMENT_OTHER): Payer: Self-pay

## 2024-05-25 MED ORDER — GABAPENTIN 100 MG PO CAPS
300.0000 mg | ORAL_CAPSULE | Freq: Every day | ORAL | 1 refills | Status: DC
  Filled 2024-05-25: qty 90, 30d supply, fill #0
  Filled 2024-06-28: qty 90, 30d supply, fill #1

## 2024-05-26 ENCOUNTER — Other Ambulatory Visit: Payer: Self-pay

## 2024-05-26 ENCOUNTER — Other Ambulatory Visit (HOSPITAL_BASED_OUTPATIENT_CLINIC_OR_DEPARTMENT_OTHER): Payer: Self-pay

## 2024-05-26 MED ORDER — ALPRAZOLAM 0.25 MG PO TABS
0.2500 mg | ORAL_TABLET | Freq: Every day | ORAL | 0 refills | Status: DC | PRN
  Filled 2024-05-26: qty 30, 30d supply, fill #0

## 2024-05-26 MED ORDER — DIVALPROEX SODIUM ER 500 MG PO TB24
500.0000 mg | ORAL_TABLET | Freq: Two times a day (BID) | ORAL | 5 refills | Status: AC
  Filled 2024-05-26: qty 60, 30d supply, fill #0
  Filled 2024-08-11: qty 60, 30d supply, fill #1
  Filled 2024-11-20: qty 60, 30d supply, fill #2
  Filled 2025-01-15: qty 60, 30d supply, fill #3

## 2024-05-26 MED ORDER — METHYLPHENIDATE HCL ER (OSM) 18 MG PO TBCR
18.0000 mg | EXTENDED_RELEASE_TABLET | Freq: Every morning | ORAL | 0 refills | Status: AC
  Filled 2024-10-17: qty 30, 30d supply, fill #0

## 2024-05-26 MED ORDER — OLANZAPINE 2.5 MG PO TABS
2.5000 mg | ORAL_TABLET | Freq: Every day | ORAL | 5 refills | Status: AC
  Filled 2024-05-26: qty 30, 30d supply, fill #0
  Filled 2024-06-28: qty 30, 30d supply, fill #1
  Filled 2024-08-03: qty 30, 30d supply, fill #2
  Filled 2024-08-29: qty 30, 30d supply, fill #3
  Filled 2024-11-07: qty 30, 30d supply, fill #4

## 2024-05-26 MED ORDER — METHYLPHENIDATE HCL ER (OSM) 18 MG PO TBCR
18.0000 mg | EXTENDED_RELEASE_TABLET | Freq: Every morning | ORAL | 0 refills | Status: AC
  Filled 2024-05-26: qty 30, 30d supply, fill #0

## 2024-05-26 MED ORDER — TRIHEXYPHENIDYL HCL 5 MG PO TABS
5.0000 mg | ORAL_TABLET | Freq: Every evening | ORAL | 5 refills | Status: AC
  Filled 2024-05-26: qty 30, 30d supply, fill #0
  Filled 2024-06-28: qty 30, 30d supply, fill #1
  Filled 2024-08-03: qty 30, 30d supply, fill #2
  Filled 2024-08-29: qty 30, 30d supply, fill #3
  Filled 2024-11-07: qty 30, 30d supply, fill #4
  Filled 2024-12-05: qty 30, 30d supply, fill #5

## 2024-05-26 MED ORDER — METHYLPHENIDATE HCL ER (OSM) 18 MG PO TBCR
18.0000 mg | EXTENDED_RELEASE_TABLET | Freq: Every morning | ORAL | 0 refills | Status: AC
  Filled 2024-08-29 – 2024-08-31 (×2): qty 30, 30d supply, fill #0

## 2024-05-28 ENCOUNTER — Other Ambulatory Visit (HOSPITAL_BASED_OUTPATIENT_CLINIC_OR_DEPARTMENT_OTHER): Payer: Self-pay

## 2024-06-02 ENCOUNTER — Other Ambulatory Visit (HOSPITAL_BASED_OUTPATIENT_CLINIC_OR_DEPARTMENT_OTHER): Payer: Self-pay

## 2024-06-08 ENCOUNTER — Other Ambulatory Visit (HOSPITAL_BASED_OUTPATIENT_CLINIC_OR_DEPARTMENT_OTHER): Payer: Self-pay

## 2024-06-14 ENCOUNTER — Other Ambulatory Visit (HOSPITAL_BASED_OUTPATIENT_CLINIC_OR_DEPARTMENT_OTHER): Payer: Self-pay

## 2024-06-28 ENCOUNTER — Other Ambulatory Visit (HOSPITAL_BASED_OUTPATIENT_CLINIC_OR_DEPARTMENT_OTHER): Payer: Self-pay | Admitting: Certified Nurse Midwife

## 2024-06-28 DIAGNOSIS — R102 Pelvic and perineal pain: Secondary | ICD-10-CM

## 2024-06-29 ENCOUNTER — Other Ambulatory Visit: Payer: Self-pay

## 2024-06-29 ENCOUNTER — Other Ambulatory Visit (HOSPITAL_BASED_OUTPATIENT_CLINIC_OR_DEPARTMENT_OTHER): Payer: Self-pay

## 2024-06-29 MED ORDER — CYCLOBENZAPRINE HCL 10 MG PO TABS
10.0000 mg | ORAL_TABLET | Freq: Every evening | ORAL | 1 refills | Status: DC
  Filled 2024-06-29 – 2024-07-16 (×2): qty 30, 30d supply, fill #0
  Filled 2024-08-29: qty 30, 30d supply, fill #1

## 2024-06-30 ENCOUNTER — Other Ambulatory Visit (HOSPITAL_BASED_OUTPATIENT_CLINIC_OR_DEPARTMENT_OTHER): Payer: Self-pay

## 2024-07-16 ENCOUNTER — Other Ambulatory Visit (HOSPITAL_BASED_OUTPATIENT_CLINIC_OR_DEPARTMENT_OTHER): Payer: Self-pay

## 2024-07-23 ENCOUNTER — Other Ambulatory Visit (HOSPITAL_BASED_OUTPATIENT_CLINIC_OR_DEPARTMENT_OTHER): Payer: Self-pay | Admitting: Certified Nurse Midwife

## 2024-07-25 ENCOUNTER — Other Ambulatory Visit (HOSPITAL_BASED_OUTPATIENT_CLINIC_OR_DEPARTMENT_OTHER): Payer: Self-pay

## 2024-07-25 ENCOUNTER — Other Ambulatory Visit: Payer: Self-pay

## 2024-07-25 MED ORDER — GABAPENTIN 100 MG PO CAPS
300.0000 mg | ORAL_CAPSULE | Freq: Every day | ORAL | 1 refills | Status: DC
  Filled 2024-07-25 – 2024-07-28 (×2): qty 90, 30d supply, fill #0
  Filled 2024-08-29: qty 90, 30d supply, fill #1

## 2024-07-28 ENCOUNTER — Other Ambulatory Visit (HOSPITAL_BASED_OUTPATIENT_CLINIC_OR_DEPARTMENT_OTHER): Payer: Self-pay

## 2024-08-03 ENCOUNTER — Other Ambulatory Visit: Payer: Self-pay

## 2024-08-03 ENCOUNTER — Other Ambulatory Visit (HOSPITAL_BASED_OUTPATIENT_CLINIC_OR_DEPARTMENT_OTHER): Payer: Self-pay

## 2024-08-04 ENCOUNTER — Other Ambulatory Visit (HOSPITAL_BASED_OUTPATIENT_CLINIC_OR_DEPARTMENT_OTHER): Payer: Self-pay

## 2024-08-11 ENCOUNTER — Other Ambulatory Visit (HOSPITAL_BASED_OUTPATIENT_CLINIC_OR_DEPARTMENT_OTHER): Payer: Self-pay

## 2024-08-19 ENCOUNTER — Other Ambulatory Visit (HOSPITAL_BASED_OUTPATIENT_CLINIC_OR_DEPARTMENT_OTHER): Payer: Self-pay

## 2024-08-29 ENCOUNTER — Other Ambulatory Visit (HOSPITAL_BASED_OUTPATIENT_CLINIC_OR_DEPARTMENT_OTHER): Payer: Self-pay

## 2024-08-30 ENCOUNTER — Other Ambulatory Visit: Payer: Self-pay

## 2024-08-30 ENCOUNTER — Other Ambulatory Visit (HOSPITAL_BASED_OUTPATIENT_CLINIC_OR_DEPARTMENT_OTHER): Payer: Self-pay

## 2024-08-31 ENCOUNTER — Other Ambulatory Visit (HOSPITAL_BASED_OUTPATIENT_CLINIC_OR_DEPARTMENT_OTHER): Payer: Self-pay

## 2024-09-02 ENCOUNTER — Other Ambulatory Visit (HOSPITAL_BASED_OUTPATIENT_CLINIC_OR_DEPARTMENT_OTHER): Payer: Self-pay

## 2024-09-15 ENCOUNTER — Other Ambulatory Visit (HOSPITAL_BASED_OUTPATIENT_CLINIC_OR_DEPARTMENT_OTHER): Payer: Self-pay

## 2024-09-15 ENCOUNTER — Other Ambulatory Visit: Payer: Self-pay

## 2024-09-15 MED ORDER — OLANZAPINE 2.5 MG PO TABS
2.5000 mg | ORAL_TABLET | Freq: Every day | ORAL | 5 refills | Status: AC
  Filled 2024-09-15: qty 30, 30d supply, fill #0
  Filled 2024-10-18: qty 30, 30d supply, fill #1

## 2024-09-15 MED ORDER — METHYLPHENIDATE HCL ER (OSM) 18 MG PO TBCR: 18.0000 mg | EXTENDED_RELEASE_TABLET | Freq: Every morning | ORAL | 0 refills | Status: AC

## 2024-09-15 MED ORDER — METHYLPHENIDATE HCL ER (OSM) 18 MG PO TBCR
18.0000 mg | EXTENDED_RELEASE_TABLET | Freq: Every morning | ORAL | 0 refills | Status: AC
  Filled 2024-11-07 – 2025-01-01 (×3): qty 30, 30d supply, fill #0

## 2024-09-15 MED ORDER — DIVALPROEX SODIUM ER 500 MG PO TB24
500.0000 mg | ORAL_TABLET | Freq: Two times a day (BID) | ORAL | 5 refills | Status: AC
  Filled 2024-09-15: qty 60, 30d supply, fill #0
  Filled 2024-11-07: qty 60, 30d supply, fill #1

## 2024-09-15 MED ORDER — TRIHEXYPHENIDYL HCL 5 MG PO TABS
5.0000 mg | ORAL_TABLET | Freq: Every evening | ORAL | 5 refills | Status: AC
  Filled 2024-09-15: qty 30, 30d supply, fill #0
  Filled 2024-10-18: qty 30, 30d supply, fill #1

## 2024-09-15 MED ORDER — CLONAZEPAM 0.5 MG PO TABS
0.5000 mg | ORAL_TABLET | Freq: Every day | ORAL | 0 refills | Status: AC | PRN
  Filled 2024-09-15: qty 30, 15d supply, fill #0

## 2024-09-19 ENCOUNTER — Other Ambulatory Visit (HOSPITAL_BASED_OUTPATIENT_CLINIC_OR_DEPARTMENT_OTHER): Payer: Self-pay | Admitting: Certified Nurse Midwife

## 2024-09-19 ENCOUNTER — Other Ambulatory Visit: Payer: Self-pay

## 2024-09-19 DIAGNOSIS — R102 Pelvic and perineal pain unspecified side: Secondary | ICD-10-CM

## 2024-09-20 ENCOUNTER — Other Ambulatory Visit (HOSPITAL_BASED_OUTPATIENT_CLINIC_OR_DEPARTMENT_OTHER): Payer: Self-pay

## 2024-09-20 ENCOUNTER — Other Ambulatory Visit: Payer: Self-pay

## 2024-09-20 MED ORDER — CYCLOBENZAPRINE HCL 10 MG PO TABS
10.0000 mg | ORAL_TABLET | Freq: Every evening | ORAL | 1 refills | Status: DC
  Filled 2024-09-20: qty 30, 30d supply, fill #0
  Filled 2024-10-27 – 2024-11-07 (×2): qty 30, 30d supply, fill #1

## 2024-10-11 ENCOUNTER — Other Ambulatory Visit (HOSPITAL_BASED_OUTPATIENT_CLINIC_OR_DEPARTMENT_OTHER): Payer: Self-pay | Admitting: Certified Nurse Midwife

## 2024-10-11 ENCOUNTER — Other Ambulatory Visit: Payer: Self-pay

## 2024-10-12 ENCOUNTER — Other Ambulatory Visit (HOSPITAL_BASED_OUTPATIENT_CLINIC_OR_DEPARTMENT_OTHER): Payer: Self-pay

## 2024-10-12 MED ORDER — GABAPENTIN 100 MG PO CAPS
300.0000 mg | ORAL_CAPSULE | Freq: Every day | ORAL | 1 refills | Status: DC
  Filled 2024-10-12 (×2): qty 90, 30d supply, fill #0
  Filled 2024-11-07: qty 90, 30d supply, fill #1

## 2024-10-13 ENCOUNTER — Other Ambulatory Visit (HOSPITAL_BASED_OUTPATIENT_CLINIC_OR_DEPARTMENT_OTHER): Payer: Self-pay

## 2024-10-17 ENCOUNTER — Other Ambulatory Visit (HOSPITAL_BASED_OUTPATIENT_CLINIC_OR_DEPARTMENT_OTHER): Payer: Self-pay

## 2024-10-18 ENCOUNTER — Other Ambulatory Visit (HOSPITAL_BASED_OUTPATIENT_CLINIC_OR_DEPARTMENT_OTHER): Payer: Self-pay

## 2024-10-18 MED ORDER — FLUZONE 0.5 ML IM SUSY
0.5000 mL | PREFILLED_SYRINGE | Freq: Once | INTRAMUSCULAR | 0 refills | Status: AC
  Filled 2024-10-18: qty 0.5, 1d supply, fill #0

## 2024-10-18 MED ORDER — COMIRNATY 30 MCG/0.3ML IM SUSY
0.3000 mL | PREFILLED_SYRINGE | Freq: Once | INTRAMUSCULAR | 0 refills | Status: AC
  Filled 2024-10-18: qty 0.3, 1d supply, fill #0

## 2024-10-19 ENCOUNTER — Other Ambulatory Visit: Payer: Self-pay

## 2024-10-19 ENCOUNTER — Other Ambulatory Visit (HOSPITAL_BASED_OUTPATIENT_CLINIC_OR_DEPARTMENT_OTHER): Payer: Self-pay

## 2024-10-19 MED ORDER — EUCRISA 2 % EX OINT
TOPICAL_OINTMENT | CUTANEOUS | 1 refills | Status: AC
  Filled 2024-10-19: qty 60, 30d supply, fill #0
  Filled 2024-11-20: qty 60, 30d supply, fill #1

## 2024-10-19 MED ORDER — FLUOCINONIDE 0.05 % EX CREA
TOPICAL_CREAM | CUTANEOUS | 1 refills | Status: AC
  Filled 2024-10-19 – 2024-11-07 (×2): qty 30, 30d supply, fill #0

## 2024-10-19 MED ORDER — MOUNJARO 15 MG/0.5ML ~~LOC~~ SOAJ
15.0000 mg | SUBCUTANEOUS | 5 refills | Status: AC
  Filled 2024-10-19 – 2024-11-08 (×4): qty 2, 28d supply, fill #0
  Filled 2024-11-20 – 2024-12-18 (×3): qty 2, 28d supply, fill #1

## 2024-10-19 MED ORDER — LOSARTAN POTASSIUM 25 MG PO TABS
25.0000 mg | ORAL_TABLET | Freq: Every day | ORAL | 3 refills | Status: AC
  Filled 2024-10-19: qty 30, 30d supply, fill #0
  Filled 2024-11-20 – 2024-12-05 (×2): qty 90, 90d supply, fill #0

## 2024-10-26 ENCOUNTER — Other Ambulatory Visit (HOSPITAL_BASED_OUTPATIENT_CLINIC_OR_DEPARTMENT_OTHER): Payer: Self-pay

## 2024-10-27 ENCOUNTER — Other Ambulatory Visit (HOSPITAL_BASED_OUTPATIENT_CLINIC_OR_DEPARTMENT_OTHER): Payer: Self-pay

## 2024-10-27 ENCOUNTER — Other Ambulatory Visit: Payer: Self-pay

## 2024-10-29 ENCOUNTER — Other Ambulatory Visit (HOSPITAL_BASED_OUTPATIENT_CLINIC_OR_DEPARTMENT_OTHER): Payer: Self-pay

## 2024-11-07 ENCOUNTER — Other Ambulatory Visit: Payer: Self-pay

## 2024-11-07 ENCOUNTER — Other Ambulatory Visit (HOSPITAL_BASED_OUTPATIENT_CLINIC_OR_DEPARTMENT_OTHER): Payer: Self-pay

## 2024-11-08 ENCOUNTER — Other Ambulatory Visit (HOSPITAL_BASED_OUTPATIENT_CLINIC_OR_DEPARTMENT_OTHER): Payer: Self-pay

## 2024-11-21 ENCOUNTER — Other Ambulatory Visit (HOSPITAL_BASED_OUTPATIENT_CLINIC_OR_DEPARTMENT_OTHER): Payer: Self-pay

## 2024-11-21 ENCOUNTER — Other Ambulatory Visit: Payer: Self-pay

## 2024-11-22 ENCOUNTER — Other Ambulatory Visit (HOSPITAL_BASED_OUTPATIENT_CLINIC_OR_DEPARTMENT_OTHER): Payer: Self-pay

## 2024-12-05 ENCOUNTER — Other Ambulatory Visit (HOSPITAL_BASED_OUTPATIENT_CLINIC_OR_DEPARTMENT_OTHER): Payer: Self-pay | Admitting: Certified Nurse Midwife

## 2024-12-05 DIAGNOSIS — R102 Pelvic and perineal pain unspecified side: Secondary | ICD-10-CM

## 2024-12-06 ENCOUNTER — Other Ambulatory Visit: Payer: Self-pay

## 2024-12-06 ENCOUNTER — Other Ambulatory Visit (HOSPITAL_BASED_OUTPATIENT_CLINIC_OR_DEPARTMENT_OTHER): Payer: Self-pay

## 2024-12-06 MED ORDER — CYCLOBENZAPRINE HCL 10 MG PO TABS
10.0000 mg | ORAL_TABLET | Freq: Every evening | ORAL | 1 refills | Status: AC
  Filled 2024-12-06: qty 30, 30d supply, fill #0
  Filled 2025-01-01: qty 30, 30d supply, fill #1

## 2024-12-06 MED ORDER — GABAPENTIN 100 MG PO CAPS
300.0000 mg | ORAL_CAPSULE | Freq: Every day | ORAL | 1 refills | Status: AC
  Filled 2024-12-06: qty 90, 30d supply, fill #0
  Filled 2025-01-15: qty 90, 30d supply, fill #1

## 2024-12-07 ENCOUNTER — Other Ambulatory Visit (HOSPITAL_BASED_OUTPATIENT_CLINIC_OR_DEPARTMENT_OTHER): Payer: Self-pay

## 2024-12-18 ENCOUNTER — Other Ambulatory Visit (HOSPITAL_BASED_OUTPATIENT_CLINIC_OR_DEPARTMENT_OTHER): Payer: Self-pay

## 2024-12-19 ENCOUNTER — Other Ambulatory Visit (HOSPITAL_BASED_OUTPATIENT_CLINIC_OR_DEPARTMENT_OTHER): Payer: Self-pay

## 2024-12-19 MED ORDER — METFORMIN HCL ER 500 MG PO TB24
2000.0000 mg | ORAL_TABLET | Freq: Every day | ORAL | 3 refills | Status: AC
  Filled 2024-12-19: qty 360, 90d supply, fill #0

## 2024-12-20 ENCOUNTER — Other Ambulatory Visit (HOSPITAL_BASED_OUTPATIENT_CLINIC_OR_DEPARTMENT_OTHER): Payer: Self-pay

## 2024-12-20 ENCOUNTER — Other Ambulatory Visit: Payer: Self-pay

## 2025-01-01 ENCOUNTER — Other Ambulatory Visit (HOSPITAL_BASED_OUTPATIENT_CLINIC_OR_DEPARTMENT_OTHER): Payer: Self-pay

## 2025-01-02 ENCOUNTER — Other Ambulatory Visit: Payer: Self-pay

## 2025-01-15 ENCOUNTER — Other Ambulatory Visit (HOSPITAL_BASED_OUTPATIENT_CLINIC_OR_DEPARTMENT_OTHER): Payer: Self-pay

## 2025-01-20 ENCOUNTER — Other Ambulatory Visit (HOSPITAL_BASED_OUTPATIENT_CLINIC_OR_DEPARTMENT_OTHER): Payer: Self-pay

## 2025-04-03 ENCOUNTER — Inpatient Hospital Stay: Admitting: Hematology and Oncology
